# Patient Record
Sex: Male | Born: 1935 | Race: White | Hispanic: No | Marital: Married | Smoking: Former smoker
Health system: Southern US, Community
[De-identification: ages and names within clinical notes are randomized; demographics above are authoritative.]

## PROBLEM LIST (undated history)

## (undated) DIAGNOSIS — C61 Malignant neoplasm of prostate: Secondary | ICD-10-CM

## (undated) DIAGNOSIS — Z860100 Personal history of colon polyps, unspecified: Secondary | ICD-10-CM

## (undated) DIAGNOSIS — R350 Frequency of micturition: Secondary | ICD-10-CM

## (undated) DIAGNOSIS — I2510 Atherosclerotic heart disease of native coronary artery without angina pectoris: Secondary | ICD-10-CM

## (undated) DIAGNOSIS — G459 Transient cerebral ischemic attack, unspecified: Secondary | ICD-10-CM

## (undated) DIAGNOSIS — R3915 Urgency of urination: Secondary | ICD-10-CM

## (undated) DIAGNOSIS — R918 Other nonspecific abnormal finding of lung field: Secondary | ICD-10-CM

## (undated) DIAGNOSIS — R351 Nocturia: Secondary | ICD-10-CM

## (undated) DIAGNOSIS — Z9289 Personal history of other medical treatment: Secondary | ICD-10-CM

## (undated) DIAGNOSIS — M1990 Unspecified osteoarthritis, unspecified site: Secondary | ICD-10-CM

## (undated) DIAGNOSIS — M2540 Effusion, unspecified joint: Secondary | ICD-10-CM

## (undated) DIAGNOSIS — M549 Dorsalgia, unspecified: Secondary | ICD-10-CM

## (undated) DIAGNOSIS — R519 Headache, unspecified: Secondary | ICD-10-CM

## (undated) DIAGNOSIS — E785 Hyperlipidemia, unspecified: Secondary | ICD-10-CM

## (undated) DIAGNOSIS — N3281 Overactive bladder: Secondary | ICD-10-CM

## (undated) DIAGNOSIS — I639 Cerebral infarction, unspecified: Secondary | ICD-10-CM

## (undated) DIAGNOSIS — R42 Dizziness and giddiness: Secondary | ICD-10-CM

## (undated) DIAGNOSIS — K219 Gastro-esophageal reflux disease without esophagitis: Secondary | ICD-10-CM

## (undated) DIAGNOSIS — H3530 Unspecified macular degeneration: Secondary | ICD-10-CM

## (undated) DIAGNOSIS — Z8669 Personal history of other diseases of the nervous system and sense organs: Secondary | ICD-10-CM

## (undated) DIAGNOSIS — R51 Headache: Secondary | ICD-10-CM

## (undated) DIAGNOSIS — I10 Essential (primary) hypertension: Secondary | ICD-10-CM

## (undated) HISTORY — PX: HIP SURGERY: SHX245

## (undated) HISTORY — PX: PROSTATECTOMY: SHX69

## (undated) HISTORY — PX: CARDIAC CATHETERIZATION: SHX172

## (undated) HISTORY — PX: OTHER SURGICAL HISTORY: SHX169

## (undated) HISTORY — PX: ESOPHAGOGASTRODUODENOSCOPY: SHX1529

## (undated) HISTORY — PX: THYROIDECTOMY, PARTIAL: SHX18

## (undated) HISTORY — DX: Other nonspecific abnormal finding of lung field: R91.8

---

## 1998-01-27 ENCOUNTER — Ambulatory Visit (HOSPITAL_COMMUNITY): Admission: RE | Admit: 1998-01-27 | Discharge: 1998-01-27 | Payer: Self-pay | Admitting: Internal Medicine

## 2001-02-12 ENCOUNTER — Encounter: Payer: Self-pay | Admitting: Orthopaedic Surgery

## 2001-02-12 ENCOUNTER — Inpatient Hospital Stay (HOSPITAL_COMMUNITY): Admission: EM | Admit: 2001-02-12 | Discharge: 2001-02-13 | Payer: Self-pay

## 2002-05-26 ENCOUNTER — Encounter (INDEPENDENT_AMBULATORY_CARE_PROVIDER_SITE_OTHER): Payer: Self-pay | Admitting: Specialist

## 2002-05-26 ENCOUNTER — Ambulatory Visit (HOSPITAL_COMMUNITY): Admission: RE | Admit: 2002-05-26 | Discharge: 2002-05-26 | Payer: Self-pay | Admitting: *Deleted

## 2003-05-01 ENCOUNTER — Inpatient Hospital Stay (HOSPITAL_COMMUNITY): Admission: EM | Admit: 2003-05-01 | Discharge: 2003-05-08 | Payer: Self-pay | Admitting: Emergency Medicine

## 2003-05-03 ENCOUNTER — Encounter: Payer: Self-pay | Admitting: *Deleted

## 2003-12-09 ENCOUNTER — Emergency Department (HOSPITAL_COMMUNITY): Admission: EM | Admit: 2003-12-09 | Discharge: 2003-12-09 | Payer: Self-pay | Admitting: Emergency Medicine

## 2004-08-24 ENCOUNTER — Ambulatory Visit (HOSPITAL_COMMUNITY): Admission: RE | Admit: 2004-08-24 | Discharge: 2004-08-24 | Payer: Self-pay | Admitting: *Deleted

## 2009-03-03 ENCOUNTER — Inpatient Hospital Stay (HOSPITAL_BASED_OUTPATIENT_CLINIC_OR_DEPARTMENT_OTHER): Admission: RE | Admit: 2009-03-03 | Discharge: 2009-03-03 | Payer: Self-pay | Admitting: Cardiovascular Disease

## 2009-06-18 ENCOUNTER — Encounter: Admission: RE | Admit: 2009-06-18 | Discharge: 2009-06-18 | Payer: Self-pay | Admitting: Internal Medicine

## 2009-12-22 ENCOUNTER — Ambulatory Visit: Payer: Self-pay | Admitting: Cardiovascular Disease

## 2010-08-18 LAB — POCT I-STAT GLUCOSE
Glucose, Bld: 118 mg/dL — ABNORMAL HIGH (ref 70–99)
Operator id: 133881

## 2010-09-20 ENCOUNTER — Other Ambulatory Visit: Payer: Self-pay | Admitting: Cardiovascular Disease

## 2010-09-20 NOTE — Telephone Encounter (Signed)
Fax received from pharmacy. Refill completed. Jodette Symphoni Helbling RN  

## 2010-09-30 NOTE — Op Note (Signed)
NAME:  Garrett Henson, Garrett Henson                       ACCOUNT NO.:  [id number]   MEDICAL RECORD NO.:  [id number]                   PATIENT TYPE:  INP   LOCATION:  0104                                 FACILITY:  [facility]   PHYSICIAN:  Kerrin Champagne, M.D.                DATE OF BIRTH:  25-Dec-1924   DATE OF PROCEDURE:  05/01/2003  DATE OF DISCHARGE:                                 OPERATIVE REPORT   PREOPERATIVE DIAGNOSIS:  Left intertrochanteric hip fracture three part  minimally displaced.   POSTOPERATIVE DIAGNOSIS:  Left intertrochanteric hip fracture three part  minimally displaced.   PROCEDURE:  Left intertrochanteric hip fracture closed reduction and  internal fixation using a short Gamma 3 nail with dynamic proximal lag screw  measuring 120 mm and distal interlocking screw of 40 mm.   SURGEON:  Kerrin Champagne, M.D.   ASSISTANT:  Wende Neighbors, P.A.   ANESTHESIA:  GOT, Hezzie Bump. Rose, M.D.   ESTIMATED BLOOD LOSS:  50 mL   DRAINS:  Foley to straight drain.   BRIEF CLINICAL NOTE:  The patient is a 77 year old male who reportedly fell  earlier this afternoon sustaining a left hip injury, difficulty  weightbearing.  He was able to upright himself and then drive himself in his  own vehicle home.  There an ambulance brought him to the emergency room with  left hip injury.  Radiographs demonstrating a left intertrochanteric hip  fracture minimally displaced.  A three part injury, lesser trochanter is  displaced, a large fragment proximally between the greater and lesser  trochanter.  Intraoperative findings as above.   DESCRIPTION OF PROCEDURE:  After adequate general anesthesia, the patient  transferred to the Chick fracture table.  The left lower extremity placed in  longitudinal traction, left groin up in place. The right leg in a well  holder.   Standard prep with duraprep solution, standard preoperative antibiotics.  An  iodine exclusion vidrape was used against the left leg.   The incision over  the left proximal thigh laterally approximately an inch proximal to the  greater trochanter approximately an inch and a half in length through the  skin and subcutaneous layers.  The tensor fascia incised in line with the  skin incision and finger palpation to identify the greater trochanter.  Cannulated awl then inserted through the tip of the greater trochanter and  the AP and lateral views observed to enter the intramedullary canal, the  proximal femur and guide pin then passed into the proximal femoral canal and  femoral channel distally.  This observed in the AP and lateral planes to be  within the femoral channel extending proximally through the tip of the  greater trochanter. At this point, the soft tissue sleeve protector was  inserted and using the cannulated large reamer reaming performed through the  tip of the greater trochanter into the proximal intramedullary channel, the  femur was performed  without difficulty reaming to 17 mm proximally.  Next,  the short gamma nail 120 degree angle was then inserted by hand and then  positioned appropriately.  The proximal neural locking nail hole in the  interior 1/2 of the femoral head and neck.  The position alignment of the  anteversion trial.  A stab incision made counter to the area of the tube for  placement of the proximal lag screw.  The stab incision carried through the  skin and subcu layers as well as through the lateral portion of the fascia  lata.  The tube for insertion of the proximal lag screw then placed against  bone opposite the area for the interlocking screw hole.  Then the guide pin  for the proximal lag screw was inserted using the insertion guide placed at  the 125 degree angle. The pin carefully positioned within the posterior 1/2  of the neck and head on the lateral view.   Pin placed into the subchondral bone, measurement of total length for the  proximal lag screw was 120 mm.  Reaming was  performed by using the step cut  reamer to obtain the total length necessary.  Then the screw was placed over  the guide pin and then screwed into position alignment just short of  subchondral bone about 2 mm in both AP and lateral planes.  Next, the  proximal pin for capturing the lag screw was inserted into the proximal  portion in the center portion of the nail.  Note that the guide pin for  insertion of the nail was removed prior to the placement of the guide pin  for the proximal lag screw.  With this then the pin was placed into position  allowing it tighten down securely and then released 1/2 turn.  Dynamic  compression obtained across the fracture site by rotating or turning the nut  just proximal to the very superficial portion of the sleeve user insertion  of the lag screw.  This was tightened to completeness after relaxing the  longitudinal traction.  Provided excellent compression across the fracture  site.  With this then the insertion handles where the lag screw was removed,  the guide pin for the lag screw was removed.  The sleeves were used for  placement of the distal interlocking screw for the nail was then used to be  placed into the hand in static position and a stab incision made counter to  this over the lateral aspect of the thigh down to bone.  Spread using a  hemostat.  The tube then placed down the bone.  The internal sleeve used to  help for drilling purposes, drill hole placed across the distal interlocking  screw hole without difficulty measuring the depth of the expected screw at  about 40 mm.  A 40 mm screw was placed without difficulty, observed AP and  lateral planes to be through the distal interlocking screw hole capturing  both the superficial and deep cortices quite nicely.  The sleeve and  insertion handle were then removed.  Free hand the proximal cap for the  gamma nail was placed without difficulty.  Irrigation then performed across all incisions.   Permanent C-arm images obtained in the AP and lateral planes  both the proximal and distal portions of the nail observing the fracture to  be in excellent position and alignment, the nail to be well seated in good  position and alignment.  Both the proximal lag screw and distal  interlocking  screws in good position and alignment.  Following irrigation then closure  was performed, reapproximating the superficial fascial layer of the tensor  muscle and tensor fascia lata with interrupted #0 Vicryl suture, the deep  subcu layer was approximated with interrupted #0 and then 2-0 Vicryl sutures  and the skin closed with stainless steel staples.  Stab incisions for the  lag screw and the distal interlock screw or closed with a superficial 2-0  Vicryl suture reapproximating the subcu layers and then a stainless steel  staple approximating the skin edges. Dressings, 4 x 4 affixed to the skin  with hypofix tape.  The patient was then returned to his bed, reactivated,  extubated and returned to the recovery room in satisfactory condition.  All  instrument and sponge counts were correct.                                               Kerrin Champagne, M.D.    JEN/MEDQ  D:  05/01/2003  T:  05/02/2003  Job:  [id number]

## 2010-09-30 NOTE — Op Note (Signed)
NAME:  TAD, FANCHER                       ACCOUNT NO.:  [id number]   MEDICAL RECORD NO.:  [id number]                   PATIENT TYPE:  INP   LOCATION:  5738                                 FACILITY:  [facility]   PHYSICIAN:  Graylin Shiver, M.D.                DATE OF BIRTH:  10-09-1924   DATE OF PROCEDURE:  05/04/2003  DATE OF DISCHARGE:                                 OPERATIVE REPORT   PROCEDURE PERFORMED:  Esophagogastroduodenoscopy.   ENDOSCOPIST:  Graylin Shiver, M.D.   INDICATIONS FOR PROCEDURE:  Coffee ground emesis.  Informed consent was  obtained after explanation of the risks of bleeding, infection and  perforation.   PREMEDICATIONS:  Fentanyl 25 mcg IV, Versed 2 mg IV.   DESCRIPTION OF PROCEDURE:  With the patient in the left lateral decubitus  position the Olympus gastroscope was inserted into the oropharynx and passed  into the esophagus.  It was advanced down the esophagus and into the  stomach.  It was then advanced into the duodenum.  The second portion and  bulb of the duodenum looked normal.  The stomach showed some focal gastritis  in the distal portion of the stomach which I suspect is where the coffee  ground emesis came from. The rest of the stomach looked normal.  No lesions  were seen in the fundus or cardia.  The esophagus looked normal.  The  patient tolerated the procedure well without complications.   IMPRESSION:  Focal gastritis in the distal stomach, otherwise no specific  abnormalities.   PLAN:  I would recommended continuing the patient on Protonix.  No other  intervention is necessary at this time.                                               Graylin Shiver, M.D.    Germain Osgood  D:  05/04/2003  T:  05/05/2003  Job:  [id number]   cc:   Kerrin Champagne, M.D.  [address]  [place]  [place] [address]  Fax: [phone]   Hettie Holstein, D.O.  Fax: [phone]

## 2010-09-30 NOTE — Discharge Summary (Signed)
[facility]  Patient:    Garrett Henson, Garrett Henson Visit Number: [id number] MRN: [id number]          Service Type: MED Location: [phone] Attending Physician:  Patricia Nettle Dictated by:   Ralene Bathe, P.A. Admit Date:  02/11/2001 Discharge Date: February 22, 202002                             Discharge Summary  ADMISSION DIAGNOSES: 1. Acute T12 compression fracture. 2. History of prostate cancer with prostatectomy.  DISCHARGE DIAGNOSES: 1. Acute T12 compression fracture. 2. history of prostate cancer with prostatectomy.  PROCEDURE:  MRI.  OPERATIONS:  None.  CONSULTATIONS:  None.  HISTORY OF PRESENT ILLNESS:  [name] is a 77 year old male who slipped while walking down an incline and had immediate onset of back pain.  He was able to ambulate initially, but significant pain.  No significant radicular symptoms, bowel or bladder dysfunction.  He had no previous history of back injury, and was brought to [facility] Emergency Room where x-rays confirmed an acute compression fracture of T12 at approximately 60%.  He was admitted for pain control measures, as well as MRI for further evaluation to rule out any acute compromise and consideration of kiphoplasty.  HOSPITAL COURSE:  The patient was admitted and placed on bedrest initially. [employer] was called and consulted for a hyperextension brace.  After fitting with a brace, the patient was able to ambulate.  He had back pain as expected, however, tolerated being out of bed.  An MRI was ordered, it showed no significant compromise.  It did confirm a compression fracture approximately 60% of the anterior wedge.  On hospital day #2, the patient was having pain that was controlled with analgesics and tolerating being out of bed.  At this time, it was felt he was stable for discharge to home for analgesics and further monitoring and consideration at a later date for kiphoplasty if his pain should  increase or somewhat become uncontrolled.  Prior to discharge, his physical examination showed no neurovascular compromise, he was neurovascularly intact.  No bowel or bladder dysfunction.  LABORATORY DATA:  Admission labs with a glucose of 224 and glucosuria, otherwise normal labs.  Radiology shows osteopenia with nothing acute in the lumbar spine.  He has a T12 anterior compression fracture at approximately 60%.  CONDITION ON DISCHARGE:  Stable.  DISCHARGE PLANS:  The patient is being discharged to home.  He has the hyperextension brace which he will wear when out of bed.  ______ supine position.  DISCHARGE MEDICATIONS: 1. Percocet 5 mg one q.4-6h. p.r.n. pain. 2. Robaxin 500 mg one q.8h. p.r.n. spasm. 3. Peri-Colace one b.i.d.  FOLLOWUP:  One week.  Recommend calcium and vitamin D supplements.  We will address osteopenia measures at a later date. Dictated by:   Ralene Bathe, P.A. Attending Physician:  Patricia Nettle. DD:  02/13/01 TD:  02/13/01 Job: [id number]

## 2010-09-30 NOTE — Discharge Summary (Signed)
Garrett Henson, Garrett Henson                       ACCOUNT NO.:  [id number]   MEDICAL RECORD NO.:  [id number]                   PATIENT TYPE:  INP   LOCATION:  5738                                 FACILITY:  [facility]   PHYSICIAN:  Kerrin Champagne, M.D.                DATE OF BIRTH:  [birth year]   DATE OF ADMISSION:  05/01/2003  DATE OF DISCHARGE:  05/08/2003                                 DISCHARGE SUMMARY   ADMISSION DIAGNOSES:  1. Left intertrochanteric hip fracture, three part, minimally displaced.  2. History of prostate cancer, status post prostatectomy.  3. Status post partial thyroidectomy.  4. Hyperlipidemia.  5. Non-insulin-dependent diabetes mellitus.   DISCHARGE DIAGNOSES:  1. Left intertrochanteric hip fracture, three part, minimally displaced.  2. Postoperative colonic ileus, resolved at discharge.  3. Diabetic gastroparesis with nausea and vomiting.  4. Focal gastritis in distal stomach per esophagogastroduodenoscopy by Dr.     Herbert Moors.  5. Postoperative anemia without requirement for blood transfusion.  6. History of prostate cancer, status post prostatectomy.  7. Status post partial thyroidectomy.  8. Hyperlipidemia.  9. Non-insulin-dependent diabetes mellitus.   PROCEDURE:  1. May 01, 2003 the patient underwent closed reduction, internal     fixation of left trochanteric hip fracture utilizing a short Gamma nail     performed by Dr. Otelia Sergeant, assisted by Maud Deed, PA-C under general     anesthesia.  2. May 04, 2003 the patient underwent esophagogastroduodenoscopy by Dr.     Herbert Moors   CONSULTATIONS:  Dr. Garnette Czech, [facility].  Dr. Herbert Moors of gastroenterology.   HISTORY OF PRESENT ILLNESS:  Briefly, the patient is a 77 year old white  male who fell on the day of admission sustaining a left hip injury.  He had  difficulty with weight bearing and pain of the left hip requiring evaluation  in the emergency room.  Radiographs in the  emergency room revealed a left  intertrochanteric hip fracture minimally displaced.  It was felt he would  require surgical intervention and was admitted for the procedure as stated  above.   BRIEF HOSPITAL COURSE:  The patient tolerated the procedure under general  anesthesia.  On the first postoperative day neurovascular motor functions of  the lower extremities was noted to be intact.  Hemoglobin and hematocrit  were checked and noted to be 12.1 and 35.3 with normal electrolytes.  The  patient was started on PCA analgesics and to begin weaning to p.o.  analgesics.  Unfortunately, on the second postoperative day he started  complaining of severe nausea and vomiting.  He was unable to participate  with physical therapy secondary to his severe nausea and vomiting and later  on that day did require placement of an NG tube.  Medical consult was  obtained and it was suspected that patient had a possible small bowel  obstruction versus an ileus and x-rays were obtained.  Coffee-ground emesis  was noted, however, the patient was noted to be hemodynamically stable and  therefore a gastroenterology consultation was obtained.  Medications were  added including proton pump inhibitor.  He did have an elevated temperature  and chest x-ray as well as blood cultures and urinalysis were obtained.  The  patient was made NPO and seen by Dr. Evette Cristal who recommended  esophagogastroduodenoscopy in a day or so.  He continued with the NG tube  until the patient was less nauseated.  After the patient eventually did have  a bowel movement arrangements were made for him to undergo the  esophagogastroduodenoscopy.  His abdominal distention continued to improve  slowly.  The esophagogastroduodenoscopy did prove focal gastritis in the  stomach.  He was placed on Protonix.  He was started on a clear liquid diet  and his diet was progressed very slowly.  He was treated with sliding scale  insulin until he was able to  take oral medications and at that time  metformin was restarted.  He continued with some mild nausea and vomiting  and Reglan was used.  He initially had been started on Coumadin for deep  venous thrombosis prophylaxis, however, this was discontinued in light of  his gastritis.  Baby aspirin was utilized for deep venous thrombosis  prophylaxis.  From an orthopedic standpoint his wounds were healing well  throughout the hospital stay.  Once he was medically stable he was able to  progress with physical therapy and was allowed out of bed with physical  therapy assistance utilizing a walker.  The patient required close  supervision utilizing a rolling walker and was able to ambulate 80 feet.  He  was allowed partial weight-bearing at 50% on the left lower extremity.  Physical therapy also assisted with range of motion and strengthening  exercises of the lower extremities.  On May 07, 2003 the patient was  afebrile with vital signs stable.  He was feeling better from a medical  standpoint and felt stable from a medical standpoint by Hospitalist's for  discharge to his home.   LABORATORY DATA:  Pertinent laboratory values include admission CBC with  values within normal limits.  Hemoglobin and hematocrit on admission were  14.9 and 43.0 respectively.  Values drop to lowest value of 10.9 and 31.5.  The patient had a normal white count throughout the hospital stay.  Coagulation studies on admission were within normal limits. He did have  mildly elevated Pro Time's as he was on Coumadin for a short period of time.  His INR did rise to as high as 2.3 and 2.4 on May 08, 2003.  The  Coumadin was discontinued and baby aspirin was started in light of his  gastritis.  Chemistry studies also revealed elevated glucose of 151 and  hemoglobin A1C of 7.5.   The patient was treated with sliding scale insulin and metformin during the  hospital stay.  Blood cultures showed no growth.  Urinalysis on  May 03, 2003 showed many bacteria, 30 protein and 15 ketones, trace hemoglobin, 250  glucose and turbid appearance.  Repeat on May 06, 2003 showed moderate  hemoglobin, 3-6 white blood cells, 3-6 red blood cells and no bacteria.  Chest x-ray on May 03, 2003 showed mild bibasilar atelectasis.  Apparent fullness of the right side of the mediastinum and right hilum  probably due to patient rotation but follow up PA of the chest was  recommended.  Colonic ileus was noted on KUB on May 03, 2003.  Electrocardiogram on admission showed normal sinus rhythm with frequent  premature ventricular complexes and a pattern of bigeminy.  Minimal voltage  criteria for left ventricular hypertrophy, nonspecific T wave abnormality  with increased PVC's since previous reading confirmed by Dr. Eden Emms.  That  electrocardiogram was on May 03, 2003.  The previous electrocardiogram  on May 01, 2003 had shown sinus rhythm with frequent premature  ventricular complexes, otherwise normal with no previous tracings confirmed  by Dr. Lucas Mallow.   PLAN:  The patient was allowed discharge on May 08, 2003.  He was given  prescriptions for Vicodin to take every 4 to 6 hours as needed for pain.  Arrangements were made for patient to have advanced home health for physical  therapy.  He will continue to be 50% partial weight-bearing on the left  lower extremity and utilize a walker for ambulation.  The patient was  advised to be on a laxative as long as he was taking narcotic analgesics.  He will resume his home medications as taken prior to admission.  Dressing  change will be done daily at home and the patient is allowed to shower. He  will follow up with Dr. Otelia Sergeant one week from the date of discharge and has  been given the number to call and make arrangements for the appointment.  Coumadin was discontinued and therefore the patient will be on one baby  aspirin daily.  He will follow up with  his primary care physician in regards  to his medical follow up and should do so in the next couple of weeks.  He  will see Dr. Evette Cristal on  PRN basis.  The patient was given a prescription for  Reglan and Protonix to use as an outpatient and will once again follow up  with his  primary care physician in regards to continuation of these medications.  The  patient was advised to call the office if he had questions or concerns prior  to his return office visit.   CONDITION ON DISCHARGE:  Stable.      Wende Neighbors, P.A.                    Kerrin Champagne, M.D.    SMV/MEDQ  D:  07/21/2003  T:  07/22/2003  Job:  [id number]

## 2010-09-30 NOTE — Consult Note (Signed)
NAME:  Garrett Henson, Garrett Henson                       ACCOUNT NO.:  [id number]   MEDICAL RECORD NO.:  [id number]                   PATIENT TYPE:  INP   LOCATION:  5727                                 FACILITY:  [facility]   PHYSICIAN:  Graylin Shiver, M.D.                DATE OF BIRTH:  Nov 05, 1924   DATE OF CONSULTATION:  05/03/2003  DATE OF DISCHARGE:                                   CONSULTATION   REFERRING PHYSICIANS:  1. Hettie Holstein, D.O.  2. Kerrin Champagne, M.D.   REASON FOR CONSULTATION:  The patient is a 77 year old Caucasian male who  suffered a left intertrochanteric hip fracture recently.  He underwent  surgical repair of this.  He had a left intertrochanteric hip fracture  closed reduction and internal fixation by Dr. Otelia Sergeant.  The patient has been  in bed and has had analgesic with a morphine PCA pump.  Today the patient  noted abdominal distention and involvement with coffee ground-appearing  material.  An NG tube was placed and was Gastroccult positive.  The material  for NG is coffee ground appearing and a small amount.  No red blood was  seen.  The patient reports that 15 years ago when he had his other hip  broken that his abdomen got distended also and he had an NG tube for a  period of time after that.  He gives a history of peptic ulcer disease years  ago.   The patient also reports constipation.  He states that he has not had a  bowel movement since admission.  He feels as though he has to have a bowel  movement.   PAST SURGICAL HISTORY:  1. Hip surgery as above.  2. Prostate surgery.  3. Thyroid nodule removed in the past.   PAST MEDICAL HISTORY:  1. Borderline diabetic.  2. Hypercholesterolemia.  3. History of peptic ulcer disease.   ALLERGIES:  No known drug allergies.   MEDICATIONS PRIOR TO ADMISSION:  Lipitor.   SOCIAL HISTORY:  He does not smoke or drink alcohol.   REVIEW OF SYSTEMS:  He has a fever today.  He denies anginal chest pain and  shortness  of breath.  Mild sputum production.  GI history as above.   PHYSICAL EXAMINATION:  VITAL SIGNS:  Temperature 101.1 degrees this  afternoon.  Blood pressure 177/73, pulse 50.  GENERAL APPEARANCE:  He is in no acute distress.  HEENT:  Nonicteric.  NECK:  Supple.  HEART:  Regular rhythm.  No murmurs.  LUNGS:  Clear.  ABDOMEN:  Bowel sounds x 3.  Mildly distended.  There is mild diffuse  discomfort.  No rebound or guarding.   LABORATORIES:  Hemoglobin 12, hematocrit 35.3.  PT 14.8.  The patient was  started on Coumadin yesterday.  Electrolytes:  Sodium 135, potassium 4.2,  chloride 102, CO2 29.   IMPRESSION:  1. Probable ileus secondary to a combination of  immobility, narcotic     analgesics, and constipation.  2. Coffee ground emesis.  3. History of peptic ulcer disease.  4. Constipation.   PLAN:  1. NG tube.  2. IV Protonix.  3. Hold Coumadin for now.  4. Follow H&Hs.  5. I will give him a Dulcolax suppository tonight since he feels like he has     to have a bowel movement.  6. Will check x-rays of abdomen.  7. Will probably have an EGD in a couple of days to evaluate the upper GI     tract.                                               Graylin Shiver, M.D.    SFG/MEDQ  D:  05/03/2003  T:  05/04/2003  Job:  [id number]   cc:   Hettie Holstein, D.O.  Fax: [phone]

## 2010-10-03 ENCOUNTER — Other Ambulatory Visit: Payer: Self-pay | Admitting: Cardiovascular Disease

## 2010-10-03 NOTE — Telephone Encounter (Signed)
Refill done.  

## 2011-02-08 ENCOUNTER — Other Ambulatory Visit: Payer: Self-pay | Admitting: Cardiovascular Disease

## 2011-02-08 NOTE — Telephone Encounter (Signed)
Fax Received. Refill Completed. Ysidra Sopher Chowoe (M.A)  

## 2011-04-21 ENCOUNTER — Emergency Department (HOSPITAL_COMMUNITY): Payer: Medicare Other

## 2011-04-21 ENCOUNTER — Inpatient Hospital Stay (HOSPITAL_COMMUNITY)
Admission: EM | Admit: 2011-04-21 | Discharge: 2011-04-24 | DRG: 066 | Disposition: A | Discharge status: Home / Self Care | Payer: Medicare Other | Attending: Internal Medicine | Admitting: Internal Medicine

## 2011-04-21 DIAGNOSIS — I6350 Cerebral infarction due to unspecified occlusion or stenosis of unspecified cerebral artery: Principal | ICD-10-CM | POA: Diagnosis present

## 2011-04-21 DIAGNOSIS — I658 Occlusion and stenosis of other precerebral arteries: Secondary | ICD-10-CM | POA: Diagnosis present

## 2011-04-21 DIAGNOSIS — I639 Cerebral infarction, unspecified: Secondary | ICD-10-CM | POA: Diagnosis present

## 2011-04-21 DIAGNOSIS — R911 Solitary pulmonary nodule: Secondary | ICD-10-CM | POA: Diagnosis present

## 2011-04-21 DIAGNOSIS — E119 Type 2 diabetes mellitus without complications: Secondary | ICD-10-CM | POA: Diagnosis present

## 2011-04-21 DIAGNOSIS — I6529 Occlusion and stenosis of unspecified carotid artery: Secondary | ICD-10-CM | POA: Diagnosis present

## 2011-04-21 DIAGNOSIS — I10 Essential (primary) hypertension: Secondary | ICD-10-CM | POA: Diagnosis present

## 2011-04-21 DIAGNOSIS — Z7902 Long term (current) use of antithrombotics/antiplatelets: Secondary | ICD-10-CM

## 2011-04-21 DIAGNOSIS — Z79899 Other long term (current) drug therapy: Secondary | ICD-10-CM

## 2011-04-21 DIAGNOSIS — R079 Chest pain, unspecified: Secondary | ICD-10-CM | POA: Diagnosis present

## 2011-04-21 DIAGNOSIS — I2510 Atherosclerotic heart disease of native coronary artery without angina pectoris: Secondary | ICD-10-CM | POA: Diagnosis present

## 2011-04-21 DIAGNOSIS — E785 Hyperlipidemia, unspecified: Secondary | ICD-10-CM | POA: Diagnosis present

## 2011-04-21 DIAGNOSIS — I080 Rheumatic disorders of both mitral and aortic valves: Secondary | ICD-10-CM | POA: Diagnosis present

## 2011-04-21 DIAGNOSIS — Z8546 Personal history of malignant neoplasm of prostate: Secondary | ICD-10-CM

## 2011-04-21 DIAGNOSIS — Z7982 Long term (current) use of aspirin: Secondary | ICD-10-CM

## 2011-04-21 HISTORY — DX: Atherosclerotic heart disease of native coronary artery without angina pectoris: I25.10

## 2011-04-21 HISTORY — DX: Malignant neoplasm of prostate: C61

## 2011-04-21 LAB — DIFFERENTIAL
Basophils Absolute: 0 10*3/uL (ref 0.0–0.1)
Basophils Relative: 1 % (ref 0–1)
Eosinophils Absolute: 0.7 10*3/uL (ref 0.0–0.7)
Eosinophils Relative: 12 % — ABNORMAL HIGH (ref 0–5)
Lymphocytes Relative: 30 % (ref 12–46)
Lymphs Abs: 1.7 10*3/uL (ref 0.7–4.0)
Monocytes Absolute: 0.5 10*3/uL (ref 0.1–1.0)
Monocytes Relative: 9 % (ref 3–12)
Neutro Abs: 2.8 10*3/uL (ref 1.7–7.7)
Neutrophils Relative %: 49 % (ref 43–77)

## 2011-04-21 LAB — TROPONIN I: Troponin I: <0.3 ng/mL (ref <0.30)

## 2011-04-21 LAB — CBC
HCT: 32.6 % — ABNORMAL LOW (ref 39.0–52.0)
Hemoglobin: 11 g/dL — ABNORMAL LOW (ref 13.0–17.0)
MCH: 27.8 pg (ref 26.0–34.0)
MCHC: 33.7 g/dL (ref 30.0–36.0)
MCV: 82.5 fL (ref 78.0–100.0)
Platelets: 194 10*3/uL (ref 150–400)
RBC: 3.95 MIL/uL — ABNORMAL LOW (ref 4.22–5.81)
RDW: 13.5 % (ref 11.5–15.5)
WBC: 5.6 10*3/uL (ref 4.0–10.5)

## 2011-04-21 LAB — COMPREHENSIVE METABOLIC PANEL
ALT: 13 U/L (ref 0–53)
AST: 18 U/L (ref 0–37)
Albumin: 3.5 g/dL (ref 3.5–5.2)
Alkaline Phosphatase: 69 U/L (ref 39–117)
BUN: 14 mg/dL (ref 6–23)
CO2: 26 mEq/L (ref 19–32)
Calcium: 8.9 mg/dL (ref 8.4–10.5)
Chloride: 101 mEq/L (ref 96–112)
Creatinine, Ser: 0.94 mg/dL (ref 0.50–1.35)
GFR calc Af Amer: 87 mL/min — ABNORMAL LOW (ref >90)
GFR calc non Af Amer: 75 mL/min — ABNORMAL LOW (ref >90)
Glucose, Bld: 158 mg/dL — ABNORMAL HIGH (ref 70–99)
Potassium: 3.8 mEq/L (ref 3.5–5.1)
Sodium: 136 mEq/L (ref 135–145)
Total Bilirubin: 0.2 mg/dL — ABNORMAL LOW (ref 0.3–1.2)
Total Protein: 6.4 g/dL (ref 6.0–8.3)

## 2011-04-21 LAB — URINALYSIS, ROUTINE W REFLEX MICROSCOPIC
Bilirubin Urine: NEGATIVE
Glucose, UA: NEGATIVE mg/dL
Hgb urine dipstick: NEGATIVE
Ketones, ur: NEGATIVE mg/dL
Leukocytes, UA: NEGATIVE
Nitrite: NEGATIVE
Protein, ur: NEGATIVE mg/dL
Specific Gravity, Urine: 1.009 (ref 1.005–1.030)
Urobilinogen, UA: 1 mg/dL (ref 0.0–1.0)
pH: 7 (ref 5.0–8.0)

## 2011-04-21 LAB — CK TOTAL AND CKMB (NOT AT ARMC)
CK, MB: 3 ng/mL (ref 0.3–4.0)
Relative Index: INVALID (ref 0.0–2.5)
Total CK: 81 U/L (ref 7–232)

## 2011-04-21 LAB — PROTIME-INR
INR: 1.07 (ref 0.00–1.49)
Prothrombin Time: 14.1 seconds (ref 11.6–15.2)

## 2011-04-21 LAB — APTT: aPTT: 34 seconds (ref 24–37)

## 2011-04-21 NOTE — ED Notes (Signed)
Dr Clarene Duke made aware pt poss. Code Stroke and in to see pt. Even though pt is nonsymptomatic at present with full rom and no drift or tingling noted .

## 2011-04-21 NOTE — ED Notes (Addendum)
At 730 pm pt and his wife were enjoying a christmas play at church when suddenly the patient leaned over to his wife and said something is wrong. He c/o numbness and tingling to left arm/leg. Nurse onscene found pt to be hypertensive with a b/p of 220/100. There is no history of HTN. Paramedics came and brought pt to ER. At present pt c/o head ache and some tingling to left arm.Pt just here from er yellow.CT here to pick up pt

## 2011-04-22 ENCOUNTER — Other Ambulatory Visit: Payer: Self-pay

## 2011-04-22 ENCOUNTER — Emergency Department (HOSPITAL_COMMUNITY): Payer: Medicare Other

## 2011-04-22 ENCOUNTER — Encounter (HOSPITAL_COMMUNITY): Payer: Self-pay | Admitting: Internal Medicine

## 2011-04-22 DIAGNOSIS — I10 Essential (primary) hypertension: Secondary | ICD-10-CM | POA: Diagnosis present

## 2011-04-22 DIAGNOSIS — R079 Chest pain, unspecified: Secondary | ICD-10-CM | POA: Diagnosis present

## 2011-04-22 DIAGNOSIS — I639 Cerebral infarction, unspecified: Secondary | ICD-10-CM | POA: Diagnosis present

## 2011-04-22 LAB — CARDIAC PANEL(CRET KIN+CKTOT+MB+TROPI): Total CK: 74 U/L (ref 7–232)

## 2011-04-22 LAB — CBC
MCH: 27.9 pg (ref 26.0–34.0)
MCV: 82.5 fL (ref 78.0–100.0)
Platelets: 216 10*3/uL (ref 150–400)
RDW: 13.4 % (ref 11.5–15.5)

## 2011-04-22 LAB — LIPID PANEL
Cholesterol: 118 mg/dL (ref 0–200)
Triglycerides: 91 mg/dL (ref <150)
VLDL: 18 mg/dL (ref 0–40)

## 2011-04-22 LAB — CREATININE, SERUM: Creatinine, Ser: 0.94 mg/dL (ref 0.50–1.35)

## 2011-04-22 LAB — HEMOGLOBIN A1C: Mean Plasma Glucose: 146 mg/dL — ABNORMAL HIGH (ref <117)

## 2011-04-22 LAB — GLUCOSE, CAPILLARY: Glucose-Capillary: 133 mg/dL — ABNORMAL HIGH (ref 70–99)

## 2011-04-22 LAB — GLUCOSE, POCT (MANUAL RESULT ENTRY): POC Glucose: 115

## 2011-04-22 MED ORDER — HEPARIN SODIUM (PORCINE) 5000 UNIT/ML IJ SOLN
5000.0000 [IU] | Freq: Three times a day (TID) | INTRAMUSCULAR | Status: DC
  Administered 2011-04-22 – 2011-04-24 (×6): 5000 [IU] via SUBCUTANEOUS
  Filled 2011-04-22 (×9): qty 1

## 2011-04-22 MED ORDER — ISOSORBIDE MONONITRATE ER 60 MG PO TB24
60.0000 mg | ORAL_TABLET | ORAL | Status: DC
  Administered 2011-04-23 – 2011-04-24 (×2): 60 mg via ORAL
  Filled 2011-04-22 (×3): qty 1

## 2011-04-22 MED ORDER — SODIUM CHLORIDE 0.9 % IJ SOLN
3.0000 mL | Freq: Two times a day (BID) | INTRAMUSCULAR | Status: DC
  Administered 2011-04-22 – 2011-04-24 (×4): 3 mL via INTRAVENOUS

## 2011-04-22 MED ORDER — SODIUM CHLORIDE 0.9 % IV SOLN: INTRAVENOUS | Status: DC

## 2011-04-22 MED ORDER — SODIUM CHLORIDE 0.9 % IV SOLN: 250.0000 mL | INTRAVENOUS | Status: DC | PRN

## 2011-04-22 MED ORDER — ASPIRIN 325 MG PO TABS
325.0000 mg | ORAL_TABLET | Freq: Every day | ORAL | Status: DC
  Administered 2011-04-22: 325 mg via ORAL
  Filled 2011-04-22 (×2): qty 1

## 2011-04-22 MED ORDER — METHYLPREDNISOLONE SODIUM SUCC 125 MG IJ SOLR
60.0000 mg | INTRAMUSCULAR | Status: DC
  Administered 2011-04-22: 60 mg via INTRAVENOUS
  Filled 2011-04-22 (×2): qty 2

## 2011-04-22 MED ORDER — THERA M PLUS PO TABS
1.0000 | ORAL_TABLET | ORAL | Status: DC
  Administered 2011-04-23 – 2011-04-24 (×2): 1 via ORAL
  Filled 2011-04-22 (×3): qty 1

## 2011-04-22 MED ORDER — ROSUVASTATIN CALCIUM 10 MG PO TABS
10.0000 mg | ORAL_TABLET | Freq: Every day | ORAL | Status: DC
  Administered 2011-04-22 – 2011-04-23 (×2): 10 mg via ORAL
  Filled 2011-04-22 (×3): qty 1

## 2011-04-22 MED ORDER — GADOBENATE DIMEGLUMINE 529 MG/ML IV SOLN
20.0000 mL | Freq: Once | INTRAVENOUS | Status: AC | PRN
  Administered 2011-04-22: 20 mL via INTRAVENOUS

## 2011-04-22 MED ORDER — CLOPIDOGREL BISULFATE 75 MG PO TABS
75.0000 mg | ORAL_TABLET | Freq: Every day | ORAL | Status: DC
  Administered 2011-04-22 – 2011-04-24 (×3): 75 mg via ORAL
  Filled 2011-04-22 (×4): qty 1

## 2011-04-22 MED ORDER — SODIUM CHLORIDE 0.9 % IV SOLN
INTRAVENOUS | Status: DC
  Administered 2011-04-22: 12:00:00 via INTRAVENOUS

## 2011-04-22 MED ORDER — ACETAMINOPHEN 325 MG PO TABS
650.0000 mg | ORAL_TABLET | Freq: Once | ORAL | Status: AC
  Administered 2011-04-22: 650 mg via ORAL
  Filled 2011-04-22: qty 2

## 2011-04-22 MED ORDER — SODIUM CHLORIDE 0.9 % IJ SOLN: 3.0000 mL | INTRAMUSCULAR | Status: DC | PRN

## 2011-04-22 MED ORDER — RANOLAZINE ER 500 MG PO TB12
500.0000 mg | ORAL_TABLET | Freq: Every day | ORAL | Status: DC
  Administered 2011-04-22 – 2011-04-23 (×2): 500 mg via ORAL
  Filled 2011-04-22 (×3): qty 1

## 2011-04-22 MED ORDER — DIPHENHYDRAMINE HCL 25 MG PO CAPS
12.5000 mg | ORAL_CAPSULE | Freq: Once | ORAL | Status: AC
  Administered 2011-04-22: 25 mg via ORAL
  Filled 2011-04-22: qty 1

## 2011-04-22 MED ORDER — ACETAMINOPHEN 325 MG PO TABS: 650.0000 mg | ORAL_TABLET | ORAL | Status: DC | PRN

## 2011-04-22 MED ORDER — OMEGA-3 FATTY ACIDS 1000 MG PO CAPS
1.0000 g | ORAL_CAPSULE | ORAL | Status: DC
  Administered 2011-04-23 – 2011-04-24 (×2): 1 g via ORAL
  Filled 2011-04-22 (×3): qty 1

## 2011-04-22 MED ORDER — NITROGLYCERIN 0.4 MG SL SUBL: 0.4000 mg | SUBLINGUAL_TABLET | SUBLINGUAL | Status: DC | PRN

## 2011-04-22 MED ORDER — SENNOSIDES-DOCUSATE SODIUM 8.6-50 MG PO TABS: 1.0000 | ORAL_TABLET | Freq: Every evening | ORAL | Status: DC | PRN

## 2011-04-22 NOTE — ED Notes (Signed)
Attempted to call report to [place], RN - [phone] - advised she is at lunch and will return call in 30 minutes.  Attempted call report to Tammy, Consulting civil engineer -[phone], was advised she is also at lunch per [employer] operational officer.  Melford Aase, Consulting civil engineer, aware.

## 2011-04-22 NOTE — ED Provider Notes (Signed)
Spoke radiologist. Patient has a posterior right frontal and right parietal short. Radiologist states he has "small areas of infarct". Spoke with Dr.Bezov. He will come consult with the patient recommends general medicine admission. Spoke with Dr. Sunnie Nielsen, triad hospitalist. She will see the patient for admission. Request telemetry bed, Team 5, temporary admission orders.  Patient reports erythema of the right hand that occurred after having MRI. Evaluated R hand has mild discoloration but no raised rash. Will give 12.5 mg Benadryl. Patient denies pruritus. Does have a wound on dorsal portion of hand. Advised close evaluation to make sure erythema does not become cellulitis.   Thomasene Lot, [place] 04/22/11 1219  Thomasene Lot, PA 04/22/11 1220

## 2011-04-22 NOTE — ED Notes (Signed)
Spoke with Garrett Henson, MRI, to ensure received modified order from Dr [facility] at 0750.  Pt remains in MRI at this time.

## 2011-04-22 NOTE — ED Notes (Signed)
[id number] ready

## 2011-04-22 NOTE — ED Provider Notes (Addendum)
History     CSN: [id number] Arrival date & time: 04/21/2011 10:02 PM   First MD Initiated Contact with Patient 04/22/11 0058      Chief Complaint  Patient presents with  . Tingling    (Consider location/radiation/quality/duration/timing/severity/associated sxs/prior treatment) HPI See below.  Past Medical History  Diagnosis Date  . Diabetes mellitus   . Coronary artery disease     No past surgical history on file.  No family history on file.  History  Substance Use Topics  . Smoking status: Not on file  . Smokeless tobacco: Not on file  . Alcohol Use:       Review of Systems  All other systems reviewed and are negative.    Allergies  Ivp dye  Home Medications   Current Outpatient Rx  Name Route Sig Dispense Refill  . ASPIRIN EC 81 MG PO TBEC Oral Take 81 mg by mouth 2 (two) times daily.      . OMEGA-3 FATTY ACIDS 1000 MG PO CAPS Oral Take 1 g by mouth every morning.      . ISOSORBIDE MONONITRATE ER 60 MG PO TB24 Oral Take 60 mg by mouth every morning.     Marland Kitchen METFORMIN HCL 1000 MG PO TABS Oral Take 1,000 mg by mouth 2 (two) times daily with a meal.      . THERA M PLUS PO TABS Oral Take 1 tablet by mouth every morning.      Marland Kitchen NAPROXEN SODIUM 220 MG PO TABS Oral Take 220 mg by mouth 2 (two) times daily.      Marland Kitchen RANOLAZINE 500 MG PO TB12 Oral Take 500 mg by mouth at bedtime.     Marland Kitchen ROSUVASTATIN CALCIUM 20 MG PO TABS Oral Take 10 mg by mouth at bedtime.      Marland Kitchen NITROGLYCERIN 0.4 MG SL SUBL Sublingual Place 0.4 mg under the tongue every 5 (five) minutes as needed. FOR CHEST PAIN       BP 112/90  Pulse 88  Temp(Src) 97.5 F (36.4 C) (Oral)  Resp 19  SpO2 98%  Physical Exam  HPI: This is an 77 year old white male who was at a church function yesterday evening about 7 PM when he developed the sudden onset of numbness in his left upper extremity and to a lesser extent in his left lower extremity. It was severe enough that he had difficulty holding a fork due to  lack of sensation. He denies weakness. There was some vague pain in his upper arm associated with numbness. There was no facial numbness. The symptoms lasted about an hour and resolved after arrival here. At that time his blood pressure was found to be 220/100 but has been lower here since. He's had a mild headache with this. He denies visual changes, chest pain, dyspnea, nausea or vomiting. There were no known mitigating or exacerbating factors.  PE: General: Well-developed, well-nourished male in no acute distress; appearance consistent with age of record HENT: normocephalic, atraumatic Eyes: pupils equal round and reactive to light; extraocular muscles intact Neck: supple; no carotid bruit Heart: regular rate and rhythm; no murmurs; occasional ectopy Lungs: clear to auscultation bilaterally Abdomen: soft; nontender; nondistended Extremities: No deformity; full range of motion; pulses normal; trace edema of lower leg Neurologic: Awake, alert and oriented; motor function intact in all extremities and symmetric; no facial droop; no drift; normal finger-to-nose; sensation intact and symmetric Skin: Warm and dry Psychiatric: Normal mood and affect    ED Course  Procedures (including  critical care time)     MDM   Nursing notes and vitals signs, including pulse oximetry, reviewed.  Summary of this visit's results, reviewed by myself:  Labs:  Results for orders placed during the hospital encounter of 04/21/11  PROTIME-INR      Component Value Range   Prothrombin Time 14.1  11.6 - 15.2 (seconds)   INR 1.07  0.00 - 1.49   APTT      Component Value Range   aPTT 34  24 - 37 (seconds)  CBC      Component Value Range   WBC 5.6  4.0 - 10.5 (K/uL)   RBC 3.95 (*) 4.22 - 5.81 (MIL/uL)   Hemoglobin 11.0 (*) 13.0 - 17.0 (g/dL)   HCT 02.7 (*) 25.3 - 52.0 (%)   MCV 82.5  78.0 - 100.0 (fL)   MCH 27.8  26.0 - 34.0 (pg)   MCHC 33.7  30.0 - 36.0 (g/dL)   RDW 66.4  40.3 - 47.4 (%)   Platelets  194  150 - 400 (K/uL)  DIFFERENTIAL      Component Value Range   Neutrophils Relative 49  43 - 77 (%)   Neutro Abs 2.8  1.7 - 7.7 (K/uL)   Lymphocytes Relative 30  12 - 46 (%)   Lymphs Abs 1.7  0.7 - 4.0 (K/uL)   Monocytes Relative 9  3 - 12 (%)   Monocytes Absolute 0.5  0.1 - 1.0 (K/uL)   Eosinophils Relative 12 (*) 0 - 5 (%)   Eosinophils Absolute 0.7  0.0 - 0.7 (K/uL)   Basophils Relative 1  0 - 1 (%)   Basophils Absolute 0.0  0.0 - 0.1 (K/uL)  COMPREHENSIVE METABOLIC PANEL      Component Value Range   Sodium 136  135 - 145 (mEq/L)   Potassium 3.8  3.5 - 5.1 (mEq/L)   Chloride 101  96 - 112 (mEq/L)   CO2 26  19 - 32 (mEq/L)   Glucose, Bld 158 (*) 70 - 99 (mg/dL)   BUN 14  6 - 23 (mg/dL)   Creatinine, Ser 2.59  0.50 - 1.35 (mg/dL)   Calcium 8.9  8.4 - 56.3 (mg/dL)   Total Protein 6.4  6.0 - 8.3 (g/dL)   Albumin 3.5  3.5 - 5.2 (g/dL)   AST 18  0 - 37 (U/L)   ALT 13  0 - 53 (U/L)   Alkaline Phosphatase 69  39 - 117 (U/L)   Total Bilirubin 0.2 (*) 0.3 - 1.2 (mg/dL)   GFR calc non Af Amer 75 (*) >90 (mL/min)   GFR calc Af Amer 87 (*) >90 (mL/min)  CK TOTAL AND CKMB      Component Value Range   Total CK 81  7 - 232 (U/L)   CK, MB 3.0  0.3 - 4.0 (ng/mL)   Relative Index RELATIVE INDEX IS INVALID  0.0 - 2.5   TROPONIN I      Component Value Range   Troponin I <0.30  <0.30 (ng/mL)  URINALYSIS, ROUTINE W REFLEX MICROSCOPIC      Component Value Range   Color, Urine YELLOW  YELLOW    APPearance CLEAR  CLEAR    Specific Gravity, Urine 1.009  1.005 - 1.030    pH 7.0  5.0 - 8.0    Glucose, UA NEGATIVE  NEGATIVE (mg/dL)   Hgb urine dipstick NEGATIVE  NEGATIVE    Bilirubin Urine NEGATIVE  NEGATIVE    Ketones, ur NEGATIVE  NEGATIVE (mg/dL)   Protein, ur NEGATIVE  NEGATIVE (mg/dL)   Urobilinogen, UA 1.0  0.0 - 1.0 (mg/dL)   Nitrite NEGATIVE  NEGATIVE    Leukocytes, UA NEGATIVE  NEGATIVE     Imaging Studies: Dg Chest 2 View  04/21/2011  *RADIOLOGY REPORT*  Clinical Data:  Hypertension.  Left upper extremity numbness and paresthesias.  CHEST - 2 VIEW 04/21/2011:  Comparison: Two-view chest x-ray 06/18/2009 [facility].  Findings: Cardiac silhouette enlarged but stable.  Thoracic aorta mildly tortuous and atherosclerotic consistent with age, unchanged. Hilar and mediastinal contours otherwise unremarkable.  Lungs clear.  Bronchovascular markings normal.  No pleural effusions. Degenerative changes and dish throughout the thoracic spine.  IMPRESSION: Stable cardiomegaly.  No acute cardiopulmonary disease.  Original Report Authenticated By: Arnell Sieving, M.D.   Ct Head Wo Contrast  04/21/2011  *RADIOLOGY REPORT*  Clinical Data: Acute onset of numbness and tingling in the left arm and leg at 07:30 p.m.  The patient was hypertensive.  CT HEAD WITHOUT CONTRAST  Technique:  Contiguous axial images were obtained from the base of the skull through the vertex without contrast.  Comparison: None.  Findings: Mild diffuse cerebral atrophy.  Mild asymmetric ventricular enlargement on the left, likely due to central atrophy. No mass effect or midline shift.  No abnormal extra-axial fluid collections.  Patchy low attenuation changes in the deep white matter probably due to small vessel ischemic change.  The gray- white matter junctions are distinct.  The basal cisterns are not effaced. No evidence of acute intracranial hemorrhage.  No depressed skull fractures. Visualized mastoid air cells and paranasal sinuses are not opacified.  Vascular calcifications in the intracranial arteries.  IMPRESSION: Mild diffuse atrophy and small vessel ischemic change.  No evidence of acute intracranial hemorrhage, mass lesion, or acute infarct. Note that MRI is more sensitive for detection of acute infarct.  Original Report Authenticated By: Marlon Pel, M.D.   EKG Interpretation:  Date & Time: 04/22/2011 0:01 AM  Rate: 71  Rhythm: normal sinus rhythm  QRS Axis: normal  Intervals: normal   ST/T Wave abnormalities: normal  Conduction Disutrbances:Early precordial R/S transition  Narrative Interpretation:   Old EKG Reviewed: Frequent PVCs seem previously  1:43 AM Neurology will see patient in the morning after his MRI is completed. She will be placed on the CDU TIA protocol.            Hanley Seamen, MD 04/22/11 0143  Hanley Seamen, MD 04/22/11 0981

## 2011-04-22 NOTE — ED Notes (Signed)
Pt ate all of lunch. 

## 2011-04-22 NOTE — ED Provider Notes (Signed)
History     CSN: [id number] Arrival date & time: 04/21/2011 10:02 PM   First MD Initiated Contact with Patient 04/22/11 0058      Chief Complaint  Patient presents with  . Tingling    (Consider location/radiation/quality/duration/timing/severity/associated sxs/prior treatment) HPI  Past Medical History  Diagnosis Date  . Diabetes mellitus   . Coronary artery disease     No past surgical history on file.  No family history on file.  History  Substance Use Topics  . Smoking status: Not on file  . Smokeless tobacco: Not on file  . Alcohol Use:       Review of Systems  Allergies  Ivp dye  Home Medications   Current Outpatient Rx  Name Route Sig Dispense Refill  . ASPIRIN EC 81 MG PO TBEC Oral Take 81 mg by mouth 2 (two) times daily.      . OMEGA-3 FATTY ACIDS 1000 MG PO CAPS Oral Take 1 g by mouth every morning.      . ISOSORBIDE MONONITRATE ER 60 MG PO TB24 Oral Take 60 mg by mouth every morning.     Marland Kitchen METFORMIN HCL 1000 MG PO TABS Oral Take 1,000 mg by mouth 2 (two) times daily with a meal.      . THERA M PLUS PO TABS Oral Take 1 tablet by mouth every morning.      Marland Kitchen NAPROXEN SODIUM 220 MG PO TABS Oral Take 220 mg by mouth 2 (two) times daily.      Marland Kitchen RANOLAZINE 500 MG PO TB12 Oral Take 500 mg by mouth at bedtime.     Marland Kitchen ROSUVASTATIN CALCIUM 20 MG PO TABS Oral Take 10 mg by mouth at bedtime.      Marland Kitchen NITROGLYCERIN 0.4 MG SL SUBL Sublingual Place 0.4 mg under the tongue every 5 (five) minutes as needed. FOR CHEST PAIN       BP 156/75  Pulse 61  Temp(Src) 98.2 F (36.8 C) (Oral)  Resp 19  SpO2 100%  Physical Exam  ED Course  Procedures (including critical care time)  Labs Reviewed  CBC - Abnormal; Notable for the following:    RBC 3.95 (*)    Hemoglobin 11.0 (*)    HCT 32.6 (*)    All other components within normal limits  DIFFERENTIAL - Abnormal; Notable for the following:    Eosinophils Relative 12 (*)    All other components within normal limits    COMPREHENSIVE METABOLIC PANEL - Abnormal; Notable for the following:    Glucose, Bld 158 (*)    Total Bilirubin 0.2 (*)    GFR calc non Af Amer 75 (*)    GFR calc Af Amer 87 (*)    All other components within normal limits  GLUCOSE, CAPILLARY - Abnormal; Notable for the following:    Glucose-Capillary 133 (*)    All other components within normal limits  PROTIME-INR  APTT  CK TOTAL AND CKMB  TROPONIN I  URINALYSIS, ROUTINE W REFLEX MICROSCOPIC  LIPID PANEL  URINE CULTURE  GLUCOSE, POCT (MANUAL RESULT ENTRY)  GLUCOSE, POCT (MANUAL RESULT ENTRY)  GLUCOSE, POCT (MANUAL RESULT ENTRY)  GLUCOSE, POCT (MANUAL RESULT ENTRY)  HEMOGLOBIN A1C   Dg Chest 2 View  04/21/2011  *RADIOLOGY REPORT*  Clinical Data: Hypertension.  Left upper extremity numbness and paresthesias.  CHEST - 2 VIEW 04/21/2011:  Comparison: Two-view chest x-ray 06/18/2009 [facility].  Findings: Cardiac silhouette enlarged but stable.  Thoracic aorta mildly tortuous and atherosclerotic consistent with  age, unchanged. Hilar and mediastinal contours otherwise unremarkable.  Lungs clear.  Bronchovascular markings normal.  No pleural effusions. Degenerative changes and dish throughout the thoracic spine.  IMPRESSION: Stable cardiomegaly.  No acute cardiopulmonary disease.  Original Report Authenticated By: Arnell Sieving, M.D.   Ct Head Wo Contrast  04/21/2011  *RADIOLOGY REPORT*  Clinical Data: Acute onset of numbness and tingling in the left arm and leg at 07:30 p.m.  The patient was hypertensive.  CT HEAD WITHOUT CONTRAST  Technique:  Contiguous axial images were obtained from the base of the skull through the vertex without contrast.  Comparison: None.  Findings: Mild diffuse cerebral atrophy.  Mild asymmetric ventricular enlargement on the left, likely due to central atrophy. No mass effect or midline shift.  No abnormal extra-axial fluid collections.  Patchy low attenuation changes in the deep white matter probably  due to small vessel ischemic change.  The gray- white matter junctions are distinct.  The basal cisterns are not effaced. No evidence of acute intracranial hemorrhage.  No depressed skull fractures. Visualized mastoid air cells and paranasal sinuses are not opacified.  Vascular calcifications in the intracranial arteries.  IMPRESSION: Mild diffuse atrophy and small vessel ischemic change.  No evidence of acute intracranial hemorrhage, mass lesion, or acute infarct. Note that MRI is more sensitive for detection of acute infarct.  Original Report Authenticated By: Marlon Pel, M.D.   Mr Angiogram Neck W Wo Contrast  04/22/2011  *RADIOLOGY REPORT*  Clinical Data:  TIA.  Sudden onset left upper extremity and left lower extremity numbness last night.  The symptoms resolved after proximally 1 hour.  Marked hypertension.  MRI HEAD WITHOUT AND WITH CONTRAST MRA HEAD WITHOUT CONTRAST MRA NECK WITHOUT AND WITH CONTRAST  Technique:  Multiplanar, multiecho pulse sequences of the brain and surrounding structures were obtained without and with intravenous contrast.  Angiographic images of the Circle of Willis were obtained using MRA technique without intravenous contrast. Angiographic images of the neck were obtained using MRA technique without and with intravenous contrast.  Carotid stenosis measurements (when applicable) are obtained utilizing NASCET criteria, using the distal internal carotid diameter as the denominator.  Contrast: 20mL MULTIHANCE GADOBENATE DIMEGLUMINE 529 MG/ML IV SOLN  Comparison:  CT head without contrast 04/21/2011 at [facility].  MRI HEAD  Findings:  A 9 mm non hemorrhagic acute cortical infarct is present to along the posterior right frontal lobe.  There is an additional punctate area of restricted diffusion within the next gyrus, likely the primary sensory strip.  No hemorrhage or mass lesion is present.  Mild generalized atrophy is present.  Mild periventricular white matter changes  are likely within normal limits for age.  There is a remote lacunar white matter infarct of the left corona radiata.  The ventricles are proportionate to the degree of atrophy.  No significant extra-axial fluid collection is present.  Flow is present in the major intracranial arteries.  The globes and orbits are intact.  A small polyp or mucous retention cyst is present in the right maxillary sinus.  The paranasal sinuses and mastoid air cells are otherwise clear.  The postcontrast images are moderately degraded by patient motion. A benign venous angioma is present within the left cerebellum and extending along the vermis.  The postcontrast images demonstrate no other areas of pathologic enhancement.  There is no enhancement associated with the areas of acute infarction.  IMPRESSION:  1.  Acute non hemorrhagic cortical infarct involving the posterior right frontal lobe.  2.  Second punctate area of acute non hemorrhagic infarction within the anterior right parietal lobe.  3.  Mild atrophy white matter disease is likely within normal limits for age. 4.  Remote lacunar infarct of the left corona radiata.  MRA HEAD  Findings: The internal carotid arteries are within normal limits bilaterally.  The A1 and M1 segments are normal.  The anterior communicating artery is patent.  There is an early bifurcation of the right A2 segment, a normal variant.  Segmental irregularities are noted within the ACA and MCA branch vessels without significant proximal stenosis or occlusion.  There is no aneurysm.  The left vertebral artery is slightly dominant to the right.  The left PICA origin is visualized and normal.  The right AICA is dominant.  The basilar artery is normal.  Both posterior cerebral arteries originate from the basilar tip.  PCA branch vessels are within normal limits bilaterally.  IMPRESSION:  1.  No significant proximal stenosis, aneurysm, or branch vessel occlusion. 2.  Mild distal small vessel disease, likely within  normal limits for age.  MRA NECK  Findings: The time-of-flight images demonstrate no significant flow disturbance at either carotid bifurcation.  Flow is antegrade within the vertebral arteries bilaterally.  A standard three-vessel arch configuration is present.  Narrowing of the proximal right vertebral artery extends for 6 mm, and measures approximately 60% relative to the distal vessel.  The left vertebral artery origin is mildly tortuous but without stenosis.  The vertebral arteries are otherwise within normal limits throughout their course in the neck.  There is some signal loss within the proximal right common carotid artery, felt be artifactual.  The right common carotid artery is otherwise within normal limits.  There is no significant stenosis at the bifurcation.  Minimal atherosclerotic irregularity is evident along the medial wall of the proximal internal carotid artery without significant stenosis.  The cervical right internal carotid artery is otherwise normal.  The left common carotid artery is normal.  There is a medial plaque along the proximal left internal carotid artery without significant stenosis relative to the distal vessel. The cervical left internal carotid artery is otherwise normal.  IMPRESSION:  1.  60% stenosis of the proximal right vertebral artery. 2.  Mild atherosclerotic irregularity at the carotid bifurcations bilaterally without significant stenosis relative to the distal vessels.  These results were called by telephone on 04/22/2011  at  09:55 a.m. to  Jacelyn Pi, PA, who verbally acknowledged these results.  Original Report Authenticated By: Jamesetta Orleans. MATTERN, M.D.   Mr Laqueta Jean NW Contrast  04/22/2011  *RADIOLOGY REPORT*  Clinical Data:  TIA.  Sudden onset left upper extremity and left lower extremity numbness last night.  The symptoms resolved after proximally 1 hour.  Marked hypertension.  MRI HEAD WITHOUT AND WITH CONTRAST MRA HEAD WITHOUT CONTRAST MRA NECK  WITHOUT AND WITH CONTRAST  Technique:  Multiplanar, multiecho pulse sequences of the brain and surrounding structures were obtained without and with intravenous contrast.  Angiographic images of the Circle of Willis were obtained using MRA technique without intravenous contrast. Angiographic images of the neck were obtained using MRA technique without and with intravenous contrast.  Carotid stenosis measurements (when applicable) are obtained utilizing NASCET criteria, using the distal internal carotid diameter as the denominator.  Contrast: 20mL MULTIHANCE GADOBENATE DIMEGLUMINE 529 MG/ML IV SOLN  Comparison:  CT head without contrast 04/21/2011 at [facility].  MRI HEAD  Findings:  A 9 mm non hemorrhagic acute cortical  infarct is present to along the posterior right frontal lobe.  There is an additional punctate area of restricted diffusion within the next gyrus, likely the primary sensory strip.  No hemorrhage or mass lesion is present.  Mild generalized atrophy is present.  Mild periventricular white matter changes are likely within normal limits for age.  There is a remote lacunar white matter infarct of the left corona radiata.  The ventricles are proportionate to the degree of atrophy.  No significant extra-axial fluid collection is present.  Flow is present in the major intracranial arteries.  The globes and orbits are intact.  A small polyp or mucous retention cyst is present in the right maxillary sinus.  The paranasal sinuses and mastoid air cells are otherwise clear.  The postcontrast images are moderately degraded by patient motion. A benign venous angioma is present within the left cerebellum and extending along the vermis.  The postcontrast images demonstrate no other areas of pathologic enhancement.  There is no enhancement associated with the areas of acute infarction.  IMPRESSION:  1.  Acute non hemorrhagic cortical infarct involving the posterior right frontal lobe. 2.  Second punctate  area of acute non hemorrhagic infarction within the anterior right parietal lobe.  3.  Mild atrophy white matter disease is likely within normal limits for age. 4.  Remote lacunar infarct of the left corona radiata.  MRA HEAD  Findings: The internal carotid arteries are within normal limits bilaterally.  The A1 and M1 segments are normal.  The anterior communicating artery is patent.  There is an early bifurcation of the right A2 segment, a normal variant.  Segmental irregularities are noted within the ACA and MCA branch vessels without significant proximal stenosis or occlusion.  There is no aneurysm.  The left vertebral artery is slightly dominant to the right.  The left PICA origin is visualized and normal.  The right AICA is dominant.  The basilar artery is normal.  Both posterior cerebral arteries originate from the basilar tip.  PCA branch vessels are within normal limits bilaterally.  IMPRESSION:  1.  No significant proximal stenosis, aneurysm, or branch vessel occlusion. 2.  Mild distal small vessel disease, likely within normal limits for age.  MRA NECK  Findings: The time-of-flight images demonstrate no significant flow disturbance at either carotid bifurcation.  Flow is antegrade within the vertebral arteries bilaterally.  A standard three-vessel arch configuration is present.  Narrowing of the proximal right vertebral artery extends for 6 mm, and measures approximately 60% relative to the distal vessel.  The left vertebral artery origin is mildly tortuous but without stenosis.  The vertebral arteries are otherwise within normal limits throughout their course in the neck.  There is some signal loss within the proximal right common carotid artery, felt be artifactual.  The right common carotid artery is otherwise within normal limits.  There is no significant stenosis at the bifurcation.  Minimal atherosclerotic irregularity is evident along the medial wall of the proximal internal carotid artery without  significant stenosis.  The cervical right internal carotid artery is otherwise normal.  The left common carotid artery is normal.  There is a medial plaque along the proximal left internal carotid artery without significant stenosis relative to the distal vessel. The cervical left internal carotid artery is otherwise normal.  IMPRESSION:  1.  60% stenosis of the proximal right vertebral artery. 2.  Mild atherosclerotic irregularity at the carotid bifurcations bilaterally without significant stenosis relative to the distal vessels.  These results  were called by telephone on 04/22/2011  at  09:55 a.m. to  Jacelyn Pi, PA, who verbally acknowledged these results.  Original Report Authenticated By: Jamesetta Orleans. MATTERN, M.D.   Mr Maxine Glenn Head/brain Wo Cm  04/22/2011  *RADIOLOGY REPORT*  Clinical Data:  TIA.  Sudden onset left upper extremity and left lower extremity numbness last night.  The symptoms resolved after proximally 1 hour.  Marked hypertension.  MRI HEAD WITHOUT AND WITH CONTRAST MRA HEAD WITHOUT CONTRAST MRA NECK WITHOUT AND WITH CONTRAST  Technique:  Multiplanar, multiecho pulse sequences of the brain and surrounding structures were obtained without and with intravenous contrast.  Angiographic images of the Circle of Willis were obtained using MRA technique without intravenous contrast. Angiographic images of the neck were obtained using MRA technique without and with intravenous contrast.  Carotid stenosis measurements (when applicable) are obtained utilizing NASCET criteria, using the distal internal carotid diameter as the denominator.  Contrast: 20mL MULTIHANCE GADOBENATE DIMEGLUMINE 529 MG/ML IV SOLN  Comparison:  CT head without contrast 04/21/2011 at [facility].  MRI HEAD  Findings:  A 9 mm non hemorrhagic acute cortical infarct is present to along the posterior right frontal lobe.  There is an additional punctate area of restricted diffusion within the next gyrus, likely the primary  sensory strip.  No hemorrhage or mass lesion is present.  Mild generalized atrophy is present.  Mild periventricular white matter changes are likely within normal limits for age.  There is a remote lacunar white matter infarct of the left corona radiata.  The ventricles are proportionate to the degree of atrophy.  No significant extra-axial fluid collection is present.  Flow is present in the major intracranial arteries.  The globes and orbits are intact.  A small polyp or mucous retention cyst is present in the right maxillary sinus.  The paranasal sinuses and mastoid air cells are otherwise clear.  The postcontrast images are moderately degraded by patient motion. A benign venous angioma is present within the left cerebellum and extending along the vermis.  The postcontrast images demonstrate no other areas of pathologic enhancement.  There is no enhancement associated with the areas of acute infarction.  IMPRESSION:  1.  Acute non hemorrhagic cortical infarct involving the posterior right frontal lobe. 2.  Second punctate area of acute non hemorrhagic infarction within the anterior right parietal lobe.  3.  Mild atrophy white matter disease is likely within normal limits for age. 4.  Remote lacunar infarct of the left corona radiata.  MRA HEAD  Findings: The internal carotid arteries are within normal limits bilaterally.  The A1 and M1 segments are normal.  The anterior communicating artery is patent.  There is an early bifurcation of the right A2 segment, a normal variant.  Segmental irregularities are noted within the ACA and MCA branch vessels without significant proximal stenosis or occlusion.  There is no aneurysm.  The left vertebral artery is slightly dominant to the right.  The left PICA origin is visualized and normal.  The right AICA is dominant.  The basilar artery is normal.  Both posterior cerebral arteries originate from the basilar tip.  PCA branch vessels are within normal limits bilaterally.   IMPRESSION:  1.  No significant proximal stenosis, aneurysm, or branch vessel occlusion. 2.  Mild distal small vessel disease, likely within normal limits for age.  MRA NECK  Findings: The time-of-flight images demonstrate no significant flow disturbance at either carotid bifurcation.  Flow is antegrade within the vertebral arteries  bilaterally.  A standard three-vessel arch configuration is present.  Narrowing of the proximal right vertebral artery extends for 6 mm, and measures approximately 60% relative to the distal vessel.  The left vertebral artery origin is mildly tortuous but without stenosis.  The vertebral arteries are otherwise within normal limits throughout their course in the neck.  There is some signal loss within the proximal right common carotid artery, felt be artifactual.  The right common carotid artery is otherwise within normal limits.  There is no significant stenosis at the bifurcation.  Minimal atherosclerotic irregularity is evident along the medial wall of the proximal internal carotid artery without significant stenosis.  The cervical right internal carotid artery is otherwise normal.  The left common carotid artery is normal.  There is a medial plaque along the proximal left internal carotid artery without significant stenosis relative to the distal vessel. The cervical left internal carotid artery is otherwise normal.  IMPRESSION:  1.  60% stenosis of the proximal right vertebral artery. 2.  Mild atherosclerotic irregularity at the carotid bifurcations bilaterally without significant stenosis relative to the distal vessels.  These results were called by telephone on 04/22/2011  at  09:55 a.m. to  Jacelyn Pi, PA, who verbally acknowledged these results.  Original Report Authenticated By: Jamesetta Orleans. MATTERN, M.D.     No diagnosis found.  ECG shows normal sinus rhythm with a rate of 62, no ectopy. Normal axis. Normal P wave. Normal QRS. Normal intervals. Normal ST and T  waves. Impression: normal ECG. No old tracing available for comparison.   MDM          Dione Booze, MD 04/22/11 1013

## 2011-04-22 NOTE — ED Provider Notes (Signed)
Medical screening examination/treatment/procedure(s) were performed by non-physician practitioner and as supervising physician I was immediately available for consultation/collaboration.  Ethelda Chick, MD 04/22/11 [phone]

## 2011-04-22 NOTE — ED Notes (Signed)
cbg done when pt returned to [facility] and it was 133.

## 2011-04-22 NOTE — H&P (Signed)
Hospital Admission Note Date: 04/22/2011  PCP: No primary provider on file.  Chief Complaint: Left arm weakness and numbness.  History of Present Illness: This is a very pleasant 77 year old male with past medical history significant for diabetes and coronary artery disease who presents to the emergency apartment complaining of left arm numbness and weakness, also left lower extremity numbness. Patient relate that he was in his usual state of health until the night prior to admission around 7:30 PM when he started to have the above mentioned symptoms. The episode lasted for 3-1/2 hour. He noted that he was having difficulty using the fork to eat. He relates that the left upper and lower extremity numbness has resolved. He is still feel some weakness on his left arm.  He also started to have while in the emergency department chest pressure, he usually gets some chest pressure on and off, his cardiology started  him Ranexa, chest pressure has resolved at this time. Chest pressure started after he had the MRI done.  He denies shortness of breath, speech difficulty, vision changes. He was taking a baby aspirin twice a day.  Allergies: Ivp dye   PAST MEDICAL HISTORY:   1. History of prostate cancer.   2. Diabetes mellitus.   3. Hyperlipidemia.   4. History of back surgery.    5. Coronary artery diseases.    Prior to Admission medications   Medication Sig Start Date End Date Taking? Authorizing Provider  aspirin EC 81 MG tablet Take 81 mg by mouth 2 (two) times daily.     Yes Historical Provider, MD  fish oil-omega-3 fatty acids 1000 MG capsule Take 1 g by mouth every morning.     Yes Historical Provider, MD  isosorbide mononitrate (IMDUR) 60 MG 24 hr tablet Take 60 mg by mouth every morning.  09/20/10  Yes Elyn Aquas., MD  metFORMIN (GLUCOPHAGE) 1000 MG tablet Take 1,000 mg by mouth 2 (two) times daily with a meal.     Yes Historical Provider, MD  Multiple Vitamins-Minerals (MULTIVITAMINS  THER. W/MINERALS) TABS Take 1 tablet by mouth every morning.     Yes Historical Provider, MD  naproxen sodium (ANAPROX) 220 MG tablet Take 220 mg by mouth 2 (two) times daily.     Yes Historical Provider, MD  ranolazine (RANEXA) 500 MG 12 hr tablet Take 500 mg by mouth at bedtime.  02/08/11  Yes Elyn Aquas., MD  rosuvastatin (CRESTOR) 20 MG tablet Take 10 mg by mouth at bedtime.     Yes Historical Provider, MD  nitroGLYCERIN (NITROSTAT) 0.4 MG SL tablet Place 0.4 mg under the tongue every 5 (five) minutes as needed. FOR CHEST PAIN     Historical Provider, MD    FAMILY HISTORY:  Significant for heart disease, stroke, diabetes,   hypertension and hypercholesterolemia.     SOCIAL HISTORY:  The patient quit smoking in 1961.  He does not drink   alcohol.  He is a retired Geophysicist/field seismologist.  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     Review of Systems: Pertinent items are noted in HPI. Physical Exam: Filed Vitals:   04/22/11 1000 04/22/11 1100 04/22/11 1145 04/22/11 1223  BP: 156/75 152/79 155/74 155/56  Pulse: 61 62 62 61  Temp:    98.2 F (36.8 C)  TempSrc:    Oral  Resp: 19 19  18   SpO2: 100% 99% 99% 99%    Intake/Output Summary (Last 24 hours) at 04/22/11 1347 Last  data filed at 04/22/11 1319  Gross per 24 hour  Intake      0 ml  Output    500 ml  Net   -500 ml   BP 155/56  Pulse 61  Temp(Src) 98.2 F (36.8 C) (Oral)  Resp 18  SpO2 99%  General Appearance:    Alert, cooperative, no distress, appears stated age  Head:    Normocephalic, without obvious abnormality, atraumatic  Eyes:    PERRL, conjunctiva/corneas clear, EOM's intact, fundi    benign, both eyes       Ears:    Normal TM's and external ear canals, both ears  Nose:   Nares normal, septum midline, mucosa normal, no drainage    or sinus tenderness  Throat:   Lips, mucosa, and tongue normal; teeth and gums normal  Neck:   Supple, symmetrical, trachea midline, no adenopathy;       thyroid:  No  enlargement/tenderness/nodules; no carotid   bruit or JVD  Back:     Symmetric, no curvature, ROM normal, no CVA tenderness  Lungs:     Clear to auscultation bilaterally, respirations unlabored     Heart:    Regular rate and rhythm, S1 and S2 normal, no murmur, rub   or gallop  Abdomen:     Soft, non-tender, bowel sounds active all four quadrants,    no masses, no organomegaly        Extremities:   Extremities normal, atraumatic, no cyanosis or edema  Pulses:   2+ and symmetric all extremities  Skin:   Skin color, texture, turgor normal, no rashes or lesions  Lymph nodes:   Cervical, supraclavicular, and axillary nodes normal  Neurologic:   CNII-XII intact. Normal strength right side. Motor strength left upper arm 4-5/5, sensation and reflexes      throughout   Lab results:  [facility] 04/21/11 2236  NA 136  K 3.8  CL 101  CO2 26  GLUCOSE 158*  BUN 14  CREATININE 0.94  CALCIUM 8.9  MG --  PHOS --    Basename 04/21/11 2236  AST 18  ALT 13  ALKPHOS 69  BILITOT 0.2*  PROT 6.4  ALBUMIN 3.5   Basename 04/21/11 2236  WBC 5.6  NEUTROABS 2.8  HGB 11.0*  HCT 32.6*  MCV 82.5  PLT 194    Basename 04/22/11 1107 04/21/11 2236  CKTOTAL -- 81  CKMB -- 3.0  CKMBINDEX -- --  TROPONINI <0.30 <0.30    Basename 04/21/11 2236  HGBA1C 6.7*    Basename 04/22/11 0505  CHOL 118  HDL 44  LDLCALC 56  TRIG 91  CHOLHDL 2.7  LDLDIRECT --   Imaging results:  Dg Chest 2 View  04/21/2011  *RADIOLOGY REPORT*  Clinical Data: Hypertension.  Left upper extremity numbness and paresthesias.  CHEST - 2 VIEW 04/21/2011:  Comparison: Two-view chest x-ray 06/18/2009 [facility].  Findings: Cardiac silhouette enlarged but stable.  Thoracic aorta mildly tortuous and atherosclerotic consistent with age, unchanged. Hilar and mediastinal contours otherwise unremarkable.  Lungs clear.  Bronchovascular markings normal.  No pleural effusions. Degenerative changes and dish throughout the  thoracic spine.  IMPRESSION: Stable cardiomegaly.  No acute cardiopulmonary disease.  Original Report Authenticated By: Arnell Sieving, M.D.   Ct Head Wo Contrast  04/21/2011  *RADIOLOGY REPORT*  Clinical Data: Acute onset of numbness and tingling in the left arm and leg at 07:30 p.m.  The patient was hypertensive.  CT HEAD WITHOUT CONTRAST  Technique:  Contiguous  axial images were obtained from the base of the skull through the vertex without contrast.  Comparison: None.  Findings: Mild diffuse cerebral atrophy.  Mild asymmetric ventricular enlargement on the left, likely due to central atrophy. No mass effect or midline shift.  No abnormal extra-axial fluid collections.  Patchy low attenuation changes in the deep white matter probably due to small vessel ischemic change.  The gray- white matter junctions are distinct.  The basal cisterns are not effaced. No evidence of acute intracranial hemorrhage.  No depressed skull fractures. Visualized mastoid air cells and paranasal sinuses are not opacified.  Vascular calcifications in the intracranial arteries.  IMPRESSION: Mild diffuse atrophy and small vessel ischemic change.  No evidence of acute intracranial hemorrhage, mass lesion, or acute infarct. Note that MRI is more sensitive for detection of acute infarct.  Original Report Authenticated By: Marlon Pel, M.D.   Mr Angiogram Neck W Wo Contrast  04/22/2011  *RADIOLOGY REPORT*  Clinical Data:  TIA.  Sudden onset left upper extremity and left lower extremity numbness last night.  The symptoms resolved after proximally 1 hour.  Marked hypertension.  MRI HEAD WITHOUT AND WITH CONTRAST MRA HEAD WITHOUT CONTRAST MRA NECK WITHOUT AND WITH CONTRAST  Technique:  Multiplanar, multiecho pulse sequences of the brain and surrounding structures were obtained without and with intravenous contrast.  Angiographic images of the Circle of Willis were obtained using MRA technique without intravenous contrast.  Angiographic images of the neck were obtained using MRA technique without and with intravenous contrast.  Carotid stenosis measurements (when applicable) are obtained utilizing NASCET criteria, using the distal internal carotid diameter as the denominator.  Contrast: 20mL MULTIHANCE GADOBENATE DIMEGLUMINE 529 MG/ML IV SOLN  Comparison:  CT head without contrast 04/21/2011 at [facility].  MRI HEAD  Findings:  A 9 mm non hemorrhagic acute cortical infarct is present to along the posterior right frontal lobe.  There is an additional punctate area of restricted diffusion within the next gyrus, likely the primary sensory strip.  No hemorrhage or mass lesion is present.  Mild generalized atrophy is present.  Mild periventricular white matter changes are likely within normal limits for age.  There is a remote lacunar white matter infarct of the left corona radiata.  The ventricles are proportionate to the degree of atrophy.  No significant extra-axial fluid collection is present.  Flow is present in the major intracranial arteries.  The globes and orbits are intact.  A small polyp or mucous retention cyst is present in the right maxillary sinus.  The paranasal sinuses and mastoid air cells are otherwise clear.  The postcontrast images are moderately degraded by patient motion. A benign venous angioma is present within the left cerebellum and extending along the vermis.  The postcontrast images demonstrate no other areas of pathologic enhancement.  There is no enhancement associated with the areas of acute infarction.  IMPRESSION:  1.  Acute non hemorrhagic cortical infarct involving the posterior right frontal lobe. 2.  Second punctate area of acute non hemorrhagic infarction within the anterior right parietal lobe.  3.  Mild atrophy white matter disease is likely within normal limits for age. 4.  Remote lacunar infarct of the left corona radiata.  MRA HEAD  Findings: The internal carotid arteries are within  normal limits bilaterally.  The A1 and M1 segments are normal.  The anterior communicating artery is patent.  There is an early bifurcation of the right A2 segment, a normal variant.  Segmental irregularities are  noted within the ACA and MCA branch vessels without significant proximal stenosis or occlusion.  There is no aneurysm.  The left vertebral artery is slightly dominant to the right.  The left PICA origin is visualized and normal.  The right AICA is dominant.  The basilar artery is normal.  Both posterior cerebral arteries originate from the basilar tip.  PCA branch vessels are within normal limits bilaterally.  IMPRESSION:  1.  No significant proximal stenosis, aneurysm, or branch vessel occlusion. 2.  Mild distal small vessel disease, likely within normal limits for age.  MRA NECK  Findings: The time-of-flight images demonstrate no significant flow disturbance at either carotid bifurcation.  Flow is antegrade within the vertebral arteries bilaterally.  A standard three-vessel arch configuration is present.  Narrowing of the proximal right vertebral artery extends for 6 mm, and measures approximately 60% relative to the distal vessel.  The left vertebral artery origin is mildly tortuous but without stenosis.  The vertebral arteries are otherwise within normal limits throughout their course in the neck.  There is some signal loss within the proximal right common carotid artery, felt be artifactual.  The right common carotid artery is otherwise within normal limits.  There is no significant stenosis at the bifurcation.  Minimal atherosclerotic irregularity is evident along the medial wall of the proximal internal carotid artery without significant stenosis.  The cervical right internal carotid artery is otherwise normal.  The left common carotid artery is normal.  There is a medial plaque along the proximal left internal carotid artery without significant stenosis relative to the distal vessel. The cervical  left internal carotid artery is otherwise normal.  IMPRESSION:  1.  60% stenosis of the proximal right vertebral artery. 2.  Mild atherosclerotic irregularity at the carotid bifurcations bilaterally without significant stenosis relative to the distal vessels.  These results were called by telephone on 04/22/2011  at  09:55 a.m. to  Jacelyn Pi, PA, who verbally acknowledged these results.  Original Report Authenticated By: Jamesetta Orleans. MATTERN, M.D.   Mr Laqueta Jean WU Contrast  04/22/2011  *RADIOLOGY REPORT*  Clinical Data:  TIA.  Sudden onset left upper extremity and left lower extremity numbness last night.  The symptoms resolved after proximally 1 hour.  Marked hypertension.  MRI HEAD WITHOUT AND WITH CONTRAST MRA HEAD WITHOUT CONTRAST MRA NECK WITHOUT AND WITH CONTRAST  Technique:  Multiplanar, multiecho pulse sequences of the brain and surrounding structures were obtained without and with intravenous contrast.  Angiographic images of the Circle of Willis were obtained using MRA technique without intravenous contrast. Angiographic images of the neck were obtained using MRA technique without and with intravenous contrast.  Carotid stenosis measurements (when applicable) are obtained utilizing NASCET criteria, using the distal internal carotid diameter as the denominator.  Contrast: 20mL MULTIHANCE GADOBENATE DIMEGLUMINE 529 MG/ML IV SOLN  Comparison:  CT head without contrast 04/21/2011 at [facility].  MRI HEAD  Findings:  A 9 mm non hemorrhagic acute cortical infarct is present to along the posterior right frontal lobe.  There is an additional punctate area of restricted diffusion within the next gyrus, likely the primary sensory strip.  No hemorrhage or mass lesion is present.  Mild generalized atrophy is present.  Mild periventricular white matter changes are likely within normal limits for age.  There is a remote lacunar white matter infarct of the left corona radiata.  The ventricles are  proportionate to the degree of atrophy.  No significant extra-axial fluid collection is present.  Flow is present in the major intracranial arteries.  The globes and orbits are intact.  A small polyp or mucous retention cyst is present in the right maxillary sinus.  The paranasal sinuses and mastoid air cells are otherwise clear.  The postcontrast images are moderately degraded by patient motion. A benign venous angioma is present within the left cerebellum and extending along the vermis.  The postcontrast images demonstrate no other areas of pathologic enhancement.  There is no enhancement associated with the areas of acute infarction.  IMPRESSION:  1.  Acute non hemorrhagic cortical infarct involving the posterior right frontal lobe. 2.  Second punctate area of acute non hemorrhagic infarction within the anterior right parietal lobe.  3.  Mild atrophy white matter disease is likely within normal limits for age. 4.  Remote lacunar infarct of the left corona radiata.  MRA HEAD  Findings: The internal carotid arteries are within normal limits bilaterally.  The A1 and M1 segments are normal.  The anterior communicating artery is patent.  There is an early bifurcation of the right A2 segment, a normal variant.  Segmental irregularities are noted within the ACA and MCA branch vessels without significant proximal stenosis or occlusion.  There is no aneurysm.  The left vertebral artery is slightly dominant to the right.  The left PICA origin is visualized and normal.  The right AICA is dominant.  The basilar artery is normal.  Both posterior cerebral arteries originate from the basilar tip.  PCA branch vessels are within normal limits bilaterally.  IMPRESSION:  1.  No significant proximal stenosis, aneurysm, or branch vessel occlusion. 2.  Mild distal small vessel disease, likely within normal limits for age.  MRA NECK  Findings: The time-of-flight images demonstrate no significant flow disturbance at either carotid  bifurcation.  Flow is antegrade within the vertebral arteries bilaterally.  A standard three-vessel arch configuration is present.  Narrowing of the proximal right vertebral artery extends for 6 mm, and measures approximately 60% relative to the distal vessel.  The left vertebral artery origin is mildly tortuous but without stenosis.  The vertebral arteries are otherwise within normal limits throughout their course in the neck.  There is some signal loss within the proximal right common carotid artery, felt be artifactual.  The right common carotid artery is otherwise within normal limits.  There is no significant stenosis at the bifurcation.  Minimal atherosclerotic irregularity is evident along the medial wall of the proximal internal carotid artery without significant stenosis.  The cervical right internal carotid artery is otherwise normal.  The left common carotid artery is normal.  There is a medial plaque along the proximal left internal carotid artery without significant stenosis relative to the distal vessel. The cervical left internal carotid artery is otherwise normal.  IMPRESSION:  1.  60% stenosis of the proximal right vertebral artery. 2.  Mild atherosclerotic irregularity at the carotid bifurcations bilaterally without significant stenosis relative to the distal vessels.  These results were called by telephone on 04/22/2011  at  09:55 a.m. to  Jacelyn Pi, PA, who verbally acknowledged these results.  Original Report Authenticated By: Jamesetta Orleans. MATTERN, M.D.   Mr Maxine Glenn Head/brain Wo Cm  04/22/2011  *RADIOLOGY REPORT*  Clinical Data:  TIA.  Sudden onset left upper extremity and left lower extremity numbness last night.  The symptoms resolved after proximally 1 hour.  Marked hypertension.  MRI HEAD WITHOUT AND WITH CONTRAST MRA HEAD WITHOUT CONTRAST MRA NECK WITHOUT AND WITH CONTRAST  Technique:  Multiplanar, multiecho pulse sequences of the brain and surrounding structures were obtained  without and with intravenous contrast.  Angiographic images of the Circle of Willis were obtained using MRA technique without intravenous contrast. Angiographic images of the neck were obtained using MRA technique without and with intravenous contrast.  Carotid stenosis measurements (when applicable) are obtained utilizing NASCET criteria, using the distal internal carotid diameter as the denominator.  Contrast: 20mL MULTIHANCE GADOBENATE DIMEGLUMINE 529 MG/ML IV SOLN  Comparison:  CT head without contrast 04/21/2011 at [facility].  MRI HEAD  Findings:  A 9 mm non hemorrhagic acute cortical infarct is present to along the posterior right frontal lobe.  There is an additional punctate area of restricted diffusion within the next gyrus, likely the primary sensory strip.  No hemorrhage or mass lesion is present.  Mild generalized atrophy is present.  Mild periventricular white matter changes are likely within normal limits for age.  There is a remote lacunar white matter infarct of the left corona radiata.  The ventricles are proportionate to the degree of atrophy.  No significant extra-axial fluid collection is present.  Flow is present in the major intracranial arteries.  The globes and orbits are intact.  A small polyp or mucous retention cyst is present in the right maxillary sinus.  The paranasal sinuses and mastoid air cells are otherwise clear.  The postcontrast images are moderately degraded by patient motion. A benign venous angioma is present within the left cerebellum and extending along the vermis.  The postcontrast images demonstrate no other areas of pathologic enhancement.  There is no enhancement associated with the areas of acute infarction.  IMPRESSION:  1.  Acute non hemorrhagic cortical infarct involving the posterior right frontal lobe. 2.  Second punctate area of acute non hemorrhagic infarction within the anterior right parietal lobe.  3.  Mild atrophy white matter disease is likely  within normal limits for age. 4.  Remote lacunar infarct of the left corona radiata.  MRA HEAD  Findings: The internal carotid arteries are within normal limits bilaterally.  The A1 and M1 segments are normal.  The anterior communicating artery is patent.  There is an early bifurcation of the right A2 segment, a normal variant.  Segmental irregularities are noted within the ACA and MCA branch vessels without significant proximal stenosis or occlusion.  There is no aneurysm.  The left vertebral artery is slightly dominant to the right.  The left PICA origin is visualized and normal.  The right AICA is dominant.  The basilar artery is normal.  Both posterior cerebral arteries originate from the basilar tip.  PCA branch vessels are within normal limits bilaterally.  IMPRESSION:  1.  No significant proximal stenosis, aneurysm, or branch vessel occlusion. 2.  Mild distal small vessel disease, likely within normal limits for age.  MRA NECK  Findings: The time-of-flight images demonstrate no significant flow disturbance at either carotid bifurcation.  Flow is antegrade within the vertebral arteries bilaterally.  A standard three-vessel arch configuration is present.  Narrowing of the proximal right vertebral artery extends for 6 mm, and measures approximately 60% relative to the distal vessel.  The left vertebral artery origin is mildly tortuous but without stenosis.  The vertebral arteries are otherwise within normal limits throughout their course in the neck.  There is some signal loss within the proximal right common carotid artery, felt be artifactual.  The right common carotid artery is otherwise within normal limits.  There is no significant stenosis at  the bifurcation.  Minimal atherosclerotic irregularity is evident along the medial wall of the proximal internal carotid artery without significant stenosis.  The cervical right internal carotid artery is otherwise normal.  The left common carotid artery is normal.   There is a medial plaque along the proximal left internal carotid artery without significant stenosis relative to the distal vessel. The cervical left internal carotid artery is otherwise normal.  IMPRESSION:  1.  60% stenosis of the proximal right vertebral artery. 2.  Mild atherosclerotic irregularity at the carotid bifurcations bilaterally without significant stenosis relative to the distal vessels.  These results were called by telephone on 04/22/2011  at  09:55 a.m. to  Jacelyn Pi, PA, who verbally acknowledged these results.  Original Report Authenticated By: Jamesetta Orleans. MATTERN, M.D.      Patient Active Hospital Problem List:  Stroke (04/22/2011)  Admit patient to telemetry. Neurology already evaluated the patient, they recommended Plavix for secondary stroke prevention . I will continue with aspirin . Will check 2-D echo, fasting lipid panel, hemoglobin A1c . Will ask for physical therapist occupational therapy and speech therapy evaluation. MRA with vertebral artery stenosis, will follow neurology recommendation . Chest pain (04/22/2011)  Probably angina equivalent. Will restart his Ranexa, and Imdur. Nitroglycerin PRN. Continue with aspirin. Will check EKG . Chest pain has resolved. Will give one dose of Solu-Medrol in case  allergy after receiving contrast for  MRI.   Hypertension (04/22/2011)  Will avoid to decrease blood pressure in event of acute stroke.    REGALADO,BELKYS M.D. [facility] [phone] 04/22/2011, 1:47 PM

## 2011-04-22 NOTE — Consult Note (Addendum)
Reason for Consult:"trasient slurred speech and left facial droop and hemiparesis last night"  HPI: Garrett Henson is an 77 y.o. Male who developed slurred speech, left hemiparesis and left arm tingling yesterday at 7 pm. Per patient, his blood pressure was 220 over 100 at the time. He has never had problems with HTN before, per patient. Most of the symptoms resolved in ED. MRI done this am showed new strokes in posterior right frontal and anterior right parietal areas. Neurology called to see patient in the morning.   Past Medical History  Diagnosis Date  . Diabetes mellitus   . Coronary artery disease    No past surgical history on file.  No family history on file.  Social History:  does not have a smoking history on file. He does not have any smokeless tobacco history on file. His alcohol and drug histories not on file.  Allergies:  Allergies  Allergen Reactions  . Ivp Dye (Iodinated Diagnostic Agents) Shortness Of Breath   Medications: I have reviewed the patient's current medications.  ROS: as above  VS: Bp 159/79 - 106/83   Neurological exam: AAO*3. No aphasia.  Was able to tell me months of the year forwards exhibiting good attention span. Followed complex commands. Cranial nerves: EOMI, PERRL. Visual fields were full. Sensation to V1 through V3 areas of the face was intact and symmetric throughout. There was no facial asymmetry. Hearing to finger rub was equal and symmetrical bilaterally. Head rotation was 5/5 bilaterally. There was no dysarthria or palatal deviation. Motor: strength was 5/5 and symmetric throughout except left proximal deltoid, which was 5-/5. Sensory: was intact throughout to light touch, pinprick. Coordination: finger-to-nose were intact and symmetric bilaterally. Reflexes: were 1+ in upper extremities and trace at the knees and absent at the ankles. Plantar response was mute bilaterally. Gait: Romberg test was negative. Had some trouble with toe, heel and  tandem walking. No gross ataxia.   Results for orders placed during the hospital encounter of 04/21/11 (from the past 48 hour(s))  PROTIME-INR     Status: Normal   Collection Time   04/21/11 10:36 PM      Component Value Range Comment   Prothrombin Time 14.1  11.6 - 15.2 (seconds)    INR 1.07  0.00 - 1.49    APTT     Status: Normal   Collection Time   04/21/11 10:36 PM      Component Value Range Comment   aPTT 34  24 - 37 (seconds)   CBC     Status: Abnormal   Collection Time   04/21/11 10:36 PM      Component Value Range Comment   WBC 5.6  4.0 - 10.5 (K/uL)    RBC 3.95 (*) 4.22 - 5.81 (MIL/uL)    Hemoglobin 11.0 (*) 13.0 - 17.0 (g/dL)    HCT 41.3 (*) 24.4 - 52.0 (%)    MCV 82.5  78.0 - 100.0 (fL)    MCH 27.8  26.0 - 34.0 (pg)    MCHC 33.7  30.0 - 36.0 (g/dL)    RDW 01.0  27.2 - 53.6 (%)    Platelets 194  150 - 400 (K/uL)   DIFFERENTIAL     Status: Abnormal   Collection Time   04/21/11 10:36 PM      Component Value Range Comment   Neutrophils Relative 49  43 - 77 (%)    Neutro Abs 2.8  1.7 - 7.7 (K/uL)    Lymphocytes Relative 30  12 - 46 (%)    Lymphs Abs 1.7  0.7 - 4.0 (K/uL)    Monocytes Relative 9  3 - 12 (%)    Monocytes Absolute 0.5  0.1 - 1.0 (K/uL)    Eosinophils Relative 12 (*) 0 - 5 (%)    Eosinophils Absolute 0.7  0.0 - 0.7 (K/uL)    Basophils Relative 1  0 - 1 (%)    Basophils Absolute 0.0  0.0 - 0.1 (K/uL)   COMPREHENSIVE METABOLIC PANEL     Status: Abnormal   Collection Time   04/21/11 10:36 PM      Component Value Range Comment   Sodium 136  135 - 145 (mEq/L)    Potassium 3.8  3.5 - 5.1 (mEq/L)    Chloride 101  96 - 112 (mEq/L)    CO2 26  19 - 32 (mEq/L)    Glucose, Bld 158 (*) 70 - 99 (mg/dL)    BUN 14  6 - 23 (mg/dL)    Creatinine, Ser 1.61  0.50 - 1.35 (mg/dL)    Calcium 8.9  8.4 - 10.5 (mg/dL)    Total Protein 6.4  6.0 - 8.3 (g/dL)    Albumin 3.5  3.5 - 5.2 (g/dL)    AST 18  0 - 37 (U/L)    ALT 13  0 - 53 (U/L)    Alkaline Phosphatase 69  39 - 117  (U/L)    Total Bilirubin 0.2 (*) 0.3 - 1.2 (mg/dL)    GFR calc non Af Amer 75 (*) >90 (mL/min)    GFR calc Af Amer 87 (*) >90 (mL/min)   CK TOTAL AND CKMB     Status: Normal   Collection Time   04/21/11 10:36 PM      Component Value Range Comment   Total CK 81  7 - 232 (U/L)    CK, MB 3.0  0.3 - 4.0 (ng/mL)    Relative Index RELATIVE INDEX IS INVALID  0.0 - 2.5    TROPONIN I     Status: Normal   Collection Time   04/21/11 10:36 PM      Component Value Range Comment   Troponin I <0.30  <0.30 (ng/mL)   URINALYSIS, ROUTINE W REFLEX MICROSCOPIC     Status: Normal   Collection Time   04/21/11 11:41 PM      Component Value Range Comment   Color, Urine YELLOW  YELLOW     APPearance CLEAR  CLEAR     Specific Gravity, Urine 1.009  1.005 - 1.030     pH 7.0  5.0 - 8.0     Glucose, UA NEGATIVE  NEGATIVE (mg/dL)    Hgb urine dipstick NEGATIVE  NEGATIVE     Bilirubin Urine NEGATIVE  NEGATIVE     Ketones, ur NEGATIVE  NEGATIVE (mg/dL)    Protein, ur NEGATIVE  NEGATIVE (mg/dL)    Urobilinogen, UA 1.0  0.0 - 1.0 (mg/dL)    Nitrite NEGATIVE  NEGATIVE     Leukocytes, UA NEGATIVE  NEGATIVE  MICROSCOPIC NOT DONE ON URINES WITH NEGATIVE PROTEIN, BLOOD, LEUKOCYTES, NITRITE, OR GLUCOSE <1000 mg/dL.  LIPID PANEL     Status: Normal   Collection Time   04/22/11  5:05 AM      Component Value Range Comment   Cholesterol 118  0 - 200 (mg/dL)    Triglycerides 91  <096 (mg/dL)    HDL 44  >04 (mg/dL)    Total CHOL/HDL Ratio 2.7  VLDL 18  0 - 40 (mg/dL)    LDL Cholesterol 56  0 - 99 (mg/dL)   GLUCOSE, CAPILLARY     Status: Abnormal   Collection Time   04/22/11  8:38 AM      Component Value Range Comment   Glucose-Capillary 133 (*) 70 - 99 (mg/dL)     Dg Chest 2 View  16/05/958  *RADIOLOGY REPORT*  Clinical Data: Hypertension.  Left upper extremity numbness and paresthesias.  CHEST - 2 VIEW 04/21/2011:  Comparison: Two-view chest x-ray 06/18/2009 [facility].  Findings: Cardiac silhouette  enlarged but stable.  Thoracic aorta mildly tortuous and atherosclerotic consistent with age, unchanged. Hilar and mediastinal contours otherwise unremarkable.  Lungs clear.  Bronchovascular markings normal.  No pleural effusions. Degenerative changes and dish throughout the thoracic spine.  IMPRESSION: Stable cardiomegaly.  No acute cardiopulmonary disease.  Original Report Authenticated By: Arnell Sieving, M.D.   Ct Head Wo Contrast  04/21/2011  *RADIOLOGY REPORT*  Clinical Data: Acute onset of numbness and tingling in the left arm and leg at 07:30 p.m.  The patient was hypertensive.  CT HEAD WITHOUT CONTRAST  Technique:  Contiguous axial images were obtained from the base of the skull through the vertex without contrast.  Comparison: None.  Findings: Mild diffuse cerebral atrophy.  Mild asymmetric ventricular enlargement on the left, likely due to central atrophy. No mass effect or midline shift.  No abnormal extra-axial fluid collections.  Patchy low attenuation changes in the deep white matter probably due to small vessel ischemic change.  The gray- white matter junctions are distinct.  The basal cisterns are not effaced. No evidence of acute intracranial hemorrhage.  No depressed skull fractures. Visualized mastoid air cells and paranasal sinuses are not opacified.  Vascular calcifications in the intracranial arteries.  IMPRESSION: Mild diffuse atrophy and small vessel ischemic change.  No evidence of acute intracranial hemorrhage, mass lesion, or acute infarct. Note that MRI is more sensitive for detection of acute infarct.  Original Report Authenticated By: Marlon Pel, M.D.   Mr Angiogram Neck W Wo Contrast  04/22/2011  *RADIOLOGY REPORT*  Clinical Data:  TIA.  Sudden onset left upper extremity and left lower extremity numbness last night.  The symptoms resolved after proximally 1 hour.  Marked hypertension.  MRI HEAD WITHOUT AND WITH CONTRAST MRA HEAD WITHOUT CONTRAST MRA NECK WITHOUT AND  WITH CONTRAST  Technique:  Multiplanar, multiecho pulse sequences of the brain and surrounding structures were obtained without and with intravenous contrast.  Angiographic images of the Circle of Willis were obtained using MRA technique without intravenous contrast. Angiographic images of the neck were obtained using MRA technique without and with intravenous contrast.  Carotid stenosis measurements (when applicable) are obtained utilizing NASCET criteria, using the distal internal carotid diameter as the denominator.  Contrast: 20mL MULTIHANCE GADOBENATE DIMEGLUMINE 529 MG/ML IV SOLN  Comparison:  CT head without contrast 04/21/2011 at [facility].  MRI HEAD  Findings:  A 9 mm non hemorrhagic acute cortical infarct is present to along the posterior right frontal lobe.  There is an additional punctate area of restricted diffusion within the next gyrus, likely the primary sensory strip.  No hemorrhage or mass lesion is present.  Mild generalized atrophy is present.  Mild periventricular white matter changes are likely within normal limits for age.  There is a remote lacunar white matter infarct of the left corona radiata.  The ventricles are proportionate to the degree of atrophy.  No significant extra-axial  fluid collection is present.  Flow is present in the major intracranial arteries.  The globes and orbits are intact.  A small polyp or mucous retention cyst is present in the right maxillary sinus.  The paranasal sinuses and mastoid air cells are otherwise clear.  The postcontrast images are moderately degraded by patient motion. A benign venous angioma is present within the left cerebellum and extending along the vermis.  The postcontrast images demonstrate no other areas of pathologic enhancement.  There is no enhancement associated with the areas of acute infarction.  IMPRESSION:  1.  Acute non hemorrhagic cortical infarct involving the posterior right frontal lobe. 2.  Second punctate area of acute  non hemorrhagic infarction within the anterior right parietal lobe.  3.  Mild atrophy white matter disease is likely within normal limits for age. 4.  Remote lacunar infarct of the left corona radiata.  MRA HEAD  Findings: The internal carotid arteries are within normal limits bilaterally.  The A1 and M1 segments are normal.  The anterior communicating artery is patent.  There is an early bifurcation of the right A2 segment, a normal variant.  Segmental irregularities are noted within the ACA and MCA branch vessels without significant proximal stenosis or occlusion.  There is no aneurysm.  The left vertebral artery is slightly dominant to the right.  The left PICA origin is visualized and normal.  The right AICA is dominant.  The basilar artery is normal.  Both posterior cerebral arteries originate from the basilar tip.  PCA branch vessels are within normal limits bilaterally.  IMPRESSION:  1.  No significant proximal stenosis, aneurysm, or branch vessel occlusion. 2.  Mild distal small vessel disease, likely within normal limits for age.  MRA NECK  Findings: The time-of-flight images demonstrate no significant flow disturbance at either carotid bifurcation.  Flow is antegrade within the vertebral arteries bilaterally.  A standard three-vessel arch configuration is present.  Narrowing of the proximal right vertebral artery extends for 6 mm, and measures approximately 60% relative to the distal vessel.  The left vertebral artery origin is mildly tortuous but without stenosis.  The vertebral arteries are otherwise within normal limits throughout their course in the neck.  There is some signal loss within the proximal right common carotid artery, felt be artifactual.  The right common carotid artery is otherwise within normal limits.  There is no significant stenosis at the bifurcation.  Minimal atherosclerotic irregularity is evident along the medial wall of the proximal internal carotid artery without significant  stenosis.  The cervical right internal carotid artery is otherwise normal.  The left common carotid artery is normal.  There is a medial plaque along the proximal left internal carotid artery without significant stenosis relative to the distal vessel. The cervical left internal carotid artery is otherwise normal.  IMPRESSION:  1.  60% stenosis of the proximal right vertebral artery. 2.  Mild atherosclerotic irregularity at the carotid bifurcations bilaterally without significant stenosis relative to the distal vessels.  These results were called by telephone on 04/22/2011  at  09:55 a.m. to  Jacelyn Pi, PA, who verbally acknowledged these results.  Original Report Authenticated By: Jamesetta Orleans. MATTERN, M.D.   Mr Laqueta Jean ZO Contrast  04/22/2011  *RADIOLOGY REPORT*  Clinical Data:  TIA.  Sudden onset left upper extremity and left lower extremity numbness last night.  The symptoms resolved after proximally 1 hour.  Marked hypertension.  MRI HEAD WITHOUT AND WITH CONTRAST MRA HEAD WITHOUT CONTRAST MRA NECK  WITHOUT AND WITH CONTRAST  Technique:  Multiplanar, multiecho pulse sequences of the brain and surrounding structures were obtained without and with intravenous contrast.  Angiographic images of the Circle of Willis were obtained using MRA technique without intravenous contrast. Angiographic images of the neck were obtained using MRA technique without and with intravenous contrast.  Carotid stenosis measurements (when applicable) are obtained utilizing NASCET criteria, using the distal internal carotid diameter as the denominator.  Contrast: 20mL MULTIHANCE GADOBENATE DIMEGLUMINE 529 MG/ML IV SOLN  Comparison:  CT head without contrast 04/21/2011 at [facility].  MRI HEAD  Findings:  A 9 mm non hemorrhagic acute cortical infarct is present to along the posterior right frontal lobe.  There is an additional punctate area of restricted diffusion within the next gyrus, likely the primary sensory strip.   No hemorrhage or mass lesion is present.  Mild generalized atrophy is present.  Mild periventricular white matter changes are likely within normal limits for age.  There is a remote lacunar white matter infarct of the left corona radiata.  The ventricles are proportionate to the degree of atrophy.  No significant extra-axial fluid collection is present.  Flow is present in the major intracranial arteries.  The globes and orbits are intact.  A small polyp or mucous retention cyst is present in the right maxillary sinus.  The paranasal sinuses and mastoid air cells are otherwise clear.  The postcontrast images are moderately degraded by patient motion. A benign venous angioma is present within the left cerebellum and extending along the vermis.  The postcontrast images demonstrate no other areas of pathologic enhancement.  There is no enhancement associated with the areas of acute infarction.  IMPRESSION:  1.  Acute non hemorrhagic cortical infarct involving the posterior right frontal lobe. 2.  Second punctate area of acute non hemorrhagic infarction within the anterior right parietal lobe.  3.  Mild atrophy white matter disease is likely within normal limits for age. 4.  Remote lacunar infarct of the left corona radiata.  MRA HEAD  Findings: The internal carotid arteries are within normal limits bilaterally.  The A1 and M1 segments are normal.  The anterior communicating artery is patent.  There is an early bifurcation of the right A2 segment, a normal variant.  Segmental irregularities are noted within the ACA and MCA branch vessels without significant proximal stenosis or occlusion.  There is no aneurysm.  The left vertebral artery is slightly dominant to the right.  The left PICA origin is visualized and normal.  The right AICA is dominant.  The basilar artery is normal.  Both posterior cerebral arteries originate from the basilar tip.  PCA branch vessels are within normal limits bilaterally.  IMPRESSION:  1.   No significant proximal stenosis, aneurysm, or branch vessel occlusion. 2.  Mild distal small vessel disease, likely within normal limits for age.  MRA NECK  Findings: The time-of-flight images demonstrate no significant flow disturbance at either carotid bifurcation.  Flow is antegrade within the vertebral arteries bilaterally.  A standard three-vessel arch configuration is present.  Narrowing of the proximal right vertebral artery extends for 6 mm, and measures approximately 60% relative to the distal vessel.  The left vertebral artery origin is mildly tortuous but without stenosis.  The vertebral arteries are otherwise within normal limits throughout their course in the neck.  There is some signal loss within the proximal right common carotid artery, felt be artifactual.  The right common carotid artery is otherwise within normal limits.  There is no significant stenosis at the bifurcation.  Minimal atherosclerotic irregularity is evident along the medial wall of the proximal internal carotid artery without significant stenosis.  The cervical right internal carotid artery is otherwise normal.  The left common carotid artery is normal.  There is a medial plaque along the proximal left internal carotid artery without significant stenosis relative to the distal vessel. The cervical left internal carotid artery is otherwise normal.  IMPRESSION:  1.  60% stenosis of the proximal right vertebral artery. 2.  Mild atherosclerotic irregularity at the carotid bifurcations bilaterally without significant stenosis relative to the distal vessels.  These results were called by telephone on 04/22/2011  at  09:55 a.m. to  Jacelyn Pi, PA, who verbally acknowledged these results.  Original Report Authenticated By: Jamesetta Orleans. MATTERN, M.D.   Mr Maxine Glenn Head/brain Wo Cm  04/22/2011  *RADIOLOGY REPORT*  Clinical Data:  TIA.  Sudden onset left upper extremity and left lower extremity numbness last night.  The symptoms  resolved after proximally 1 hour.  Marked hypertension.  MRI HEAD WITHOUT AND WITH CONTRAST MRA HEAD WITHOUT CONTRAST MRA NECK WITHOUT AND WITH CONTRAST  Technique:  Multiplanar, multiecho pulse sequences of the brain and surrounding structures were obtained without and with intravenous contrast.  Angiographic images of the Circle of Willis were obtained using MRA technique without intravenous contrast. Angiographic images of the neck were obtained using MRA technique without and with intravenous contrast.  Carotid stenosis measurements (when applicable) are obtained utilizing NASCET criteria, using the distal internal carotid diameter as the denominator.  Contrast: 20mL MULTIHANCE GADOBENATE DIMEGLUMINE 529 MG/ML IV SOLN  Comparison:  CT head without contrast 04/21/2011 at [facility].  MRI HEAD  Findings:  A 9 mm non hemorrhagic acute cortical infarct is present to along the posterior right frontal lobe.  There is an additional punctate area of restricted diffusion within the next gyrus, likely the primary sensory strip.  No hemorrhage or mass lesion is present.  Mild generalized atrophy is present.  Mild periventricular white matter changes are likely within normal limits for age.  There is a remote lacunar white matter infarct of the left corona radiata.  The ventricles are proportionate to the degree of atrophy.  No significant extra-axial fluid collection is present.  Flow is present in the major intracranial arteries.  The globes and orbits are intact.  A small polyp or mucous retention cyst is present in the right maxillary sinus.  The paranasal sinuses and mastoid air cells are otherwise clear.  The postcontrast images are moderately degraded by patient motion. A benign venous angioma is present within the left cerebellum and extending along the vermis.  The postcontrast images demonstrate no other areas of pathologic enhancement.  There is no enhancement associated with the areas of acute  infarction.  IMPRESSION:  1.  Acute non hemorrhagic cortical infarct involving the posterior right frontal lobe. 2.  Second punctate area of acute non hemorrhagic infarction within the anterior right parietal lobe.  3.  Mild atrophy white matter disease is likely within normal limits for age. 4.  Remote lacunar infarct of the left corona radiata.  MRA HEAD  Findings: The internal carotid arteries are within normal limits bilaterally.  The A1 and M1 segments are normal.  The anterior communicating artery is patent.  There is an early bifurcation of the right A2 segment, a normal variant.  Segmental irregularities are noted within the ACA and MCA branch vessels without significant proximal stenosis or  occlusion.  There is no aneurysm.  The left vertebral artery is slightly dominant to the right.  The left PICA origin is visualized and normal.  The right AICA is dominant.  The basilar artery is normal.  Both posterior cerebral arteries originate from the basilar tip.  PCA branch vessels are within normal limits bilaterally.  IMPRESSION:  1.  No significant proximal stenosis, aneurysm, or branch vessel occlusion. 2.  Mild distal small vessel disease, likely within normal limits for age.  MRA NECK  Findings: The time-of-flight images demonstrate no significant flow disturbance at either carotid bifurcation.  Flow is antegrade within the vertebral arteries bilaterally.  A standard three-vessel arch configuration is present.  Narrowing of the proximal right vertebral artery extends for 6 mm, and measures approximately 60% relative to the distal vessel.  The left vertebral artery origin is mildly tortuous but without stenosis.  The vertebral arteries are otherwise within normal limits throughout their course in the neck.  There is some signal loss within the proximal right common carotid artery, felt be artifactual.  The right common carotid artery is otherwise within normal limits.  There is no significant stenosis at the  bifurcation.  Minimal atherosclerotic irregularity is evident along the medial wall of the proximal internal carotid artery without significant stenosis.  The cervical right internal carotid artery is otherwise normal.  The left common carotid artery is normal.  There is a medial plaque along the proximal left internal carotid artery without significant stenosis relative to the distal vessel. The cervical left internal carotid artery is otherwise normal.  IMPRESSION:  1.  60% stenosis of the proximal right vertebral artery. 2.  Mild atherosclerotic irregularity at the carotid bifurcations bilaterally without significant stenosis relative to the distal vessels.  These results were called by telephone on 04/22/2011  at  09:55 a.m. to  Jacelyn Pi, PA, who verbally acknowledged these results.  Original Report Authenticated By: Jamesetta Orleans. MATTERN, M.D.   Assessment/Plan: 77 years old man with new acute non hemorrhagic cortical infarct involving the posterior right frontal lobe and second punctate area of acute non hemorrhagic infarction within the anterior right parietal lobe that presented as slurred speech and left arm numbness and weakness with some leg weakness as well.  1) Telemetry, Echo 2) Switch to Plavix from ASA 3) HgA1C - pending 4) Second set of troponins - address chest pressure 5) NPO until nursing Swallow Eval 6) PT/OT 7) MAP b/w 120 to 130 8) Neurochecks q4h 9) IV fluids 10) Will follow  Carmell Austria 04/22/2011, 10:58 AM

## 2011-04-22 NOTE — ED Notes (Signed)
Neurologist here to see pt but is consulting only on pt and requests that medicine be paged to admit pt.

## 2011-04-23 ENCOUNTER — Inpatient Hospital Stay (HOSPITAL_COMMUNITY): Payer: Medicare Other

## 2011-04-23 DIAGNOSIS — I2510 Atherosclerotic heart disease of native coronary artery without angina pectoris: Secondary | ICD-10-CM | POA: Diagnosis present

## 2011-04-23 DIAGNOSIS — E119 Type 2 diabetes mellitus without complications: Secondary | ICD-10-CM | POA: Diagnosis present

## 2011-04-23 DIAGNOSIS — I359 Nonrheumatic aortic valve disorder, unspecified: Secondary | ICD-10-CM

## 2011-04-23 LAB — GLUCOSE, CAPILLARY
Glucose-Capillary: 221 mg/dL — ABNORMAL HIGH (ref 70–99)
Glucose-Capillary: 179 mg/dL — ABNORMAL HIGH (ref 70–99)
Glucose-Capillary: 160 mg/dL — ABNORMAL HIGH (ref 70–99)

## 2011-04-23 LAB — LIPID PANEL
HDL: 59 mg/dL (ref >39)
LDL Cholesterol: 71 mg/dL (ref 0–99)
Total CHOL/HDL Ratio: 2.5 RATIO
VLDL: 15 mg/dL (ref 0–40)

## 2011-04-23 LAB — CARDIAC PANEL(CRET KIN+CKTOT+MB+TROPI)
Relative Index: INVALID (ref 0.0–2.5)
Total CK: 67 U/L (ref 7–232)

## 2011-04-23 LAB — URINE CULTURE
Colony Count: 40000
Culture  Setup Time: 201212080038

## 2011-04-23 LAB — HEMOGLOBIN A1C
Hgb A1c MFr Bld: 6.6 % — ABNORMAL HIGH (ref <5.7)
Mean Plasma Glucose: 143 mg/dL — ABNORMAL HIGH (ref <117)

## 2011-04-23 MED ORDER — METFORMIN HCL 500 MG PO TABS
1000.0000 mg | ORAL_TABLET | Freq: Two times a day (BID) | ORAL | Status: DC
  Administered 2011-04-24: 1000 mg via ORAL
  Filled 2011-04-23 (×4): qty 2

## 2011-04-23 MED ORDER — INSULIN ASPART 100 UNIT/ML ~~LOC~~ SOLN
0.0000 [IU] | Freq: Three times a day (TID) | SUBCUTANEOUS | Status: DC
  Filled 2011-04-23: qty 3

## 2011-04-23 NOTE — Progress Notes (Signed)
Subjective: Patient is alert and cooperative. No significant complaints at this time. The patient indicates that he believes that his symptoms have completely resolved. Patient was up with a therapist walking in the halls, doing well with this.  Objective: Vital signs in last 24 hours: Temp:  [97.7 F (36.5 C)-98.7 F (37.1 C)] 98.1 F (36.7 C) (12/09 0928) Pulse Rate:  [60-73] 73  (12/09 0928) Resp:  [18-22] 22  (12/09 0928) BP: (150-190)/(56-83) 152/70 mmHg (12/09 0928) SpO2:  [95 %-99 %] 98 % (12/09 0928) Weight change:  Last BM Date: 04/19/11  Intake/Output from previous day: 12/08 0701 - 12/09 0700 In: -  Out: 1550 [Urine:1550] Intake/Output this shift:    @ROS @  The patient reports occasional headaches, this has a long duration, over several years.  The patient denies any visual field changes, and denies double vision.  The patient denies shortness of breath, chest pain.  The patient denies abdominal pain, nausea or vomiting.   The patient reports no numbness or weakness of the face, arms, or legs.   Physical Examination:  Mental Status Exam: The patient is alert and cooperative at the time of the examination. The patient is oriented x3.  Motor Exam: The patient moves all 4 extremities, and has good strength bilaterally.  Cerebellar Exam: The patient has good finger-nose-finger and heel-to-shin bilaterally. Gait was not tested.  Deep tendon reflexes: Deep tendon reflexes are symmetric throughout. Toes are downgoing bilaterally.  Cranial Nerve Exam: Facial symmetry is present. Extraocular movements are full. Visual fields are full. Speech is well enunciated, no aphasia or dysarthria is noted.    Lab Results:  [facility] 04/22/11 1738 04/21/11 2236  WBC 7.4 5.6  HGB 12.9* 11.0*  HCT 38.1* 32.6*  PLT 216 194   BMET  Basename 04/22/11 1738 04/21/11 2236  NA -- 136  K -- 3.8  CL -- 101  CO2 -- 26  GLUCOSE -- 158*  BUN -- 14  CREATININE 0.94 0.94    CALCIUM -- 8.9    Studies/Results: Dg Chest 2 View  04/23/2011  *RADIOLOGY REPORT*  Clinical Data: Stroke  CHEST - 2 VIEW  Comparison: 04/21/2011  Findings: Hyperaeration.  Ovoid opacity in the right lower lung zone has developed.  Linear atelectasis at the left base.  No pneumothorax.  No pleural effusion.  Stable T12 compression deformity.  Osteopenia.  IMPRESSION: New right lower lung zone ovoid opacity.  Pulmonary nodule is not excluded.  This may represent focal airspace disease.  Attention on short-term follow-up.  Original Report Authenticated By: Donavan Burnet, M.D.   Dg Chest 2 View  04/21/2011  *RADIOLOGY REPORT*  Clinical Data: Hypertension.  Left upper extremity numbness and paresthesias.  CHEST - 2 VIEW 04/21/2011:  Comparison: Two-view chest x-ray 06/18/2009 [facility].  Findings: Cardiac silhouette enlarged but stable.  Thoracic aorta mildly tortuous and atherosclerotic consistent with age, unchanged. Hilar and mediastinal contours otherwise unremarkable.  Lungs clear.  Bronchovascular markings normal.  No pleural effusions. Degenerative changes and dish throughout the thoracic spine.  IMPRESSION: Stable cardiomegaly.  No acute cardiopulmonary disease.  Original Report Authenticated By: Arnell Sieving, M.D.   Ct Head Wo Contrast  04/21/2011  *RADIOLOGY REPORT*  Clinical Data: Acute onset of numbness and tingling in the left arm and leg at 07:30 p.m.  The patient was hypertensive.  CT HEAD WITHOUT CONTRAST  Technique:  Contiguous axial images were obtained from the base of the skull through the vertex without contrast.  Comparison: None.  Findings:  Mild diffuse cerebral atrophy.  Mild asymmetric ventricular enlargement on the left, likely due to central atrophy. No mass effect or midline shift.  No abnormal extra-axial fluid collections.  Patchy low attenuation changes in the deep white matter probably due to small vessel ischemic change.  The gray- white matter junctions are  distinct.  The basal cisterns are not effaced. No evidence of acute intracranial hemorrhage.  No depressed skull fractures. Visualized mastoid air cells and paranasal sinuses are not opacified.  Vascular calcifications in the intracranial arteries.  IMPRESSION: Mild diffuse atrophy and small vessel ischemic change.  No evidence of acute intracranial hemorrhage, mass lesion, or acute infarct. Note that MRI is more sensitive for detection of acute infarct.  Original Report Authenticated By: Marlon Pel, M.D.   Mr Angiogram Neck W Wo Contrast  04/22/2011  *RADIOLOGY REPORT*  Clinical Data:  TIA.  Sudden onset left upper extremity and left lower extremity numbness last night.  The symptoms resolved after proximally 1 hour.  Marked hypertension.  MRI HEAD WITHOUT AND WITH CONTRAST MRA HEAD WITHOUT CONTRAST MRA NECK WITHOUT AND WITH CONTRAST  Technique:  Multiplanar, multiecho pulse sequences of the brain and surrounding structures were obtained without and with intravenous contrast.  Angiographic images of the Circle of Adewale Pucillo were obtained using MRA technique without intravenous contrast. Angiographic images of the neck were obtained using MRA technique without and with intravenous contrast.  Carotid stenosis measurements (when applicable) are obtained utilizing NASCET criteria, using the distal internal carotid diameter as the denominator.  Contrast: 20mL MULTIHANCE GADOBENATE DIMEGLUMINE 529 MG/ML IV SOLN  Comparison:  CT head without contrast 04/21/2011 at [facility].  MRI HEAD  Findings:  A 9 mm non hemorrhagic acute cortical infarct is present to along the posterior right frontal lobe.  There is an additional punctate area of restricted diffusion within the next gyrus, likely the primary sensory strip.  No hemorrhage or mass lesion is present.  Mild generalized atrophy is present.  Mild periventricular white matter changes are likely within normal limits for age.  There is a remote lacunar white  matter infarct of the left corona radiata.  The ventricles are proportionate to the degree of atrophy.  No significant extra-axial fluid collection is present.  Flow is present in the major intracranial arteries.  The globes and orbits are intact.  A small polyp or mucous retention cyst is present in the right maxillary sinus.  The paranasal sinuses and mastoid air cells are otherwise clear.  The postcontrast images are moderately degraded by patient motion. A benign venous angioma is present within the left cerebellum and extending along the vermis.  The postcontrast images demonstrate no other areas of pathologic enhancement.  There is no enhancement associated with the areas of acute infarction.  IMPRESSION:  1.  Acute non hemorrhagic cortical infarct involving the posterior right frontal lobe. 2.  Second punctate area of acute non hemorrhagic infarction within the anterior right parietal lobe.  3.  Mild atrophy white matter disease is likely within normal limits for age. 4.  Remote lacunar infarct of the left corona radiata.  MRA HEAD  Findings: The internal carotid arteries are within normal limits bilaterally.  The A1 and M1 segments are normal.  The anterior communicating artery is patent.  There is an early bifurcation of the right A2 segment, a normal variant.  Segmental irregularities are noted within the ACA and MCA branch vessels without significant proximal stenosis or occlusion.  There is no aneurysm.  The left vertebral artery is slightly dominant to the right.  The left PICA origin is visualized and normal.  The right AICA is dominant.  The basilar artery is normal.  Both posterior cerebral arteries originate from the basilar tip.  PCA branch vessels are within normal limits bilaterally.  IMPRESSION:  1.  No significant proximal stenosis, aneurysm, or branch vessel occlusion. 2.  Mild distal small vessel disease, likely within normal limits for age.  MRA NECK  Findings: The time-of-flight images  demonstrate no significant flow disturbance at either carotid bifurcation.  Flow is antegrade within the vertebral arteries bilaterally.  A standard three-vessel arch configuration is present.  Narrowing of the proximal right vertebral artery extends for 6 mm, and measures approximately 60% relative to the distal vessel.  The left vertebral artery origin is mildly tortuous but without stenosis.  The vertebral arteries are otherwise within normal limits throughout their course in the neck.  There is some signal loss within the proximal right common carotid artery, felt be artifactual.  The right common carotid artery is otherwise within normal limits.  There is no significant stenosis at the bifurcation.  Minimal atherosclerotic irregularity is evident along the medial wall of the proximal internal carotid artery without significant stenosis.  The cervical right internal carotid artery is otherwise normal.  The left common carotid artery is normal.  There is a medial plaque along the proximal left internal carotid artery without significant stenosis relative to the distal vessel. The cervical left internal carotid artery is otherwise normal.  IMPRESSION:  1.  60% stenosis of the proximal right vertebral artery. 2.  Mild atherosclerotic irregularity at the carotid bifurcations bilaterally without significant stenosis relative to the distal vessels.  These results were called by telephone on 04/22/2011  at  09:55 a.m. to  Jacelyn Pi, PA, who verbally acknowledged these results.  Original Report Authenticated By: Jamesetta Orleans. MATTERN, M.D.   Mr Laqueta Jean ZO Contrast  04/22/2011  *RADIOLOGY REPORT*  Clinical Data:  TIA.  Sudden onset left upper extremity and left lower extremity numbness last night.  The symptoms resolved after proximally 1 hour.  Marked hypertension.  MRI HEAD WITHOUT AND WITH CONTRAST MRA HEAD WITHOUT CONTRAST MRA NECK WITHOUT AND WITH CONTRAST  Technique:  Multiplanar, multiecho pulse  sequences of the brain and surrounding structures were obtained without and with intravenous contrast.  Angiographic images of the Circle of Fabiano Ginley were obtained using MRA technique without intravenous contrast. Angiographic images of the neck were obtained using MRA technique without and with intravenous contrast.  Carotid stenosis measurements (when applicable) are obtained utilizing NASCET criteria, using the distal internal carotid diameter as the denominator.  Contrast: 20mL MULTIHANCE GADOBENATE DIMEGLUMINE 529 MG/ML IV SOLN  Comparison:  CT head without contrast 04/21/2011 at [facility].  MRI HEAD  Findings:  A 9 mm non hemorrhagic acute cortical infarct is present to along the posterior right frontal lobe.  There is an additional punctate area of restricted diffusion within the next gyrus, likely the primary sensory strip.  No hemorrhage or mass lesion is present.  Mild generalized atrophy is present.  Mild periventricular white matter changes are likely within normal limits for age.  There is a remote lacunar white matter infarct of the left corona radiata.  The ventricles are proportionate to the degree of atrophy.  No significant extra-axial fluid collection is present.  Flow is present in the major intracranial arteries.  The globes and orbits are intact.  A small polyp  or mucous retention cyst is present in the right maxillary sinus.  The paranasal sinuses and mastoid air cells are otherwise clear.  The postcontrast images are moderately degraded by patient motion. A benign venous angioma is present within the left cerebellum and extending along the vermis.  The postcontrast images demonstrate no other areas of pathologic enhancement.  There is no enhancement associated with the areas of acute infarction.  IMPRESSION:  1.  Acute non hemorrhagic cortical infarct involving the posterior right frontal lobe. 2.  Second punctate area of acute non hemorrhagic infarction within the anterior right  parietal lobe.  3.  Mild atrophy white matter disease is likely within normal limits for age. 4.  Remote lacunar infarct of the left corona radiata.  MRA HEAD  Findings: The internal carotid arteries are within normal limits bilaterally.  The A1 and M1 segments are normal.  The anterior communicating artery is patent.  There is an early bifurcation of the right A2 segment, a normal variant.  Segmental irregularities are noted within the ACA and MCA branch vessels without significant proximal stenosis or occlusion.  There is no aneurysm.  The left vertebral artery is slightly dominant to the right.  The left PICA origin is visualized and normal.  The right AICA is dominant.  The basilar artery is normal.  Both posterior cerebral arteries originate from the basilar tip.  PCA branch vessels are within normal limits bilaterally.  IMPRESSION:  1.  No significant proximal stenosis, aneurysm, or branch vessel occlusion. 2.  Mild distal small vessel disease, likely within normal limits for age.  MRA NECK  Findings: The time-of-flight images demonstrate no significant flow disturbance at either carotid bifurcation.  Flow is antegrade within the vertebral arteries bilaterally.  A standard three-vessel arch configuration is present.  Narrowing of the proximal right vertebral artery extends for 6 mm, and measures approximately 60% relative to the distal vessel.  The left vertebral artery origin is mildly tortuous but without stenosis.  The vertebral arteries are otherwise within normal limits throughout their course in the neck.  There is some signal loss within the proximal right common carotid artery, felt be artifactual.  The right common carotid artery is otherwise within normal limits.  There is no significant stenosis at the bifurcation.  Minimal atherosclerotic irregularity is evident along the medial wall of the proximal internal carotid artery without significant stenosis.  The cervical right internal carotid artery is  otherwise normal.  The left common carotid artery is normal.  There is a medial plaque along the proximal left internal carotid artery without significant stenosis relative to the distal vessel. The cervical left internal carotid artery is otherwise normal.  IMPRESSION:  1.  60% stenosis of the proximal right vertebral artery. 2.  Mild atherosclerotic irregularity at the carotid bifurcations bilaterally without significant stenosis relative to the distal vessels.  These results were called by telephone on 04/22/2011  at  09:55 a.m. to  Jacelyn Pi, PA, who verbally acknowledged these results.  Original Report Authenticated By: Jamesetta Orleans. MATTERN, M.D.   Mr Maxine Glenn Head/brain Wo Cm  04/22/2011  *RADIOLOGY REPORT*  Clinical Data:  TIA.  Sudden onset left upper extremity and left lower extremity numbness last night.  The symptoms resolved after proximally 1 hour.  Marked hypertension.  MRI HEAD WITHOUT AND WITH CONTRAST MRA HEAD WITHOUT CONTRAST MRA NECK WITHOUT AND WITH CONTRAST  Technique:  Multiplanar, multiecho pulse sequences of the brain and surrounding structures were obtained without and with intravenous contrast.  Angiographic images of the Circle of Anthonee Gelin were obtained using MRA technique without intravenous contrast. Angiographic images of the neck were obtained using MRA technique without and with intravenous contrast.  Carotid stenosis measurements (when applicable) are obtained utilizing NASCET criteria, using the distal internal carotid diameter as the denominator.  Contrast: 20mL MULTIHANCE GADOBENATE DIMEGLUMINE 529 MG/ML IV SOLN  Comparison:  CT head without contrast 04/21/2011 at [facility].  MRI HEAD  Findings:  A 9 mm non hemorrhagic acute cortical infarct is present to along the posterior right frontal lobe.  There is an additional punctate area of restricted diffusion within the next gyrus, likely the primary sensory strip.  No hemorrhage or mass lesion is present.  Mild  generalized atrophy is present.  Mild periventricular white matter changes are likely within normal limits for age.  There is a remote lacunar white matter infarct of the left corona radiata.  The ventricles are proportionate to the degree of atrophy.  No significant extra-axial fluid collection is present.  Flow is present in the major intracranial arteries.  The globes and orbits are intact.  A small polyp or mucous retention cyst is present in the right maxillary sinus.  The paranasal sinuses and mastoid air cells are otherwise clear.  The postcontrast images are moderately degraded by patient motion. A benign venous angioma is present within the left cerebellum and extending along the vermis.  The postcontrast images demonstrate no other areas of pathologic enhancement.  There is no enhancement associated with the areas of acute infarction.  IMPRESSION:  1.  Acute non hemorrhagic cortical infarct involving the posterior right frontal lobe. 2.  Second punctate area of acute non hemorrhagic infarction within the anterior right parietal lobe.  3.  Mild atrophy white matter disease is likely within normal limits for age. 4.  Remote lacunar infarct of the left corona radiata.  MRA HEAD  Findings: The internal carotid arteries are within normal limits bilaterally.  The A1 and M1 segments are normal.  The anterior communicating artery is patent.  There is an early bifurcation of the right A2 segment, a normal variant.  Segmental irregularities are noted within the ACA and MCA branch vessels without significant proximal stenosis or occlusion.  There is no aneurysm.  The left vertebral artery is slightly dominant to the right.  The left PICA origin is visualized and normal.  The right AICA is dominant.  The basilar artery is normal.  Both posterior cerebral arteries originate from the basilar tip.  PCA branch vessels are within normal limits bilaterally.  IMPRESSION:  1.  No significant proximal stenosis, aneurysm, or  branch vessel occlusion. 2.  Mild distal small vessel disease, likely within normal limits for age.  MRA NECK  Findings: The time-of-flight images demonstrate no significant flow disturbance at either carotid bifurcation.  Flow is antegrade within the vertebral arteries bilaterally.  A standard three-vessel arch configuration is present.  Narrowing of the proximal right vertebral artery extends for 6 mm, and measures approximately 60% relative to the distal vessel.  The left vertebral artery origin is mildly tortuous but without stenosis.  The vertebral arteries are otherwise within normal limits throughout their course in the neck.  There is some signal loss within the proximal right common carotid artery, felt be artifactual.  The right common carotid artery is otherwise within normal limits.  There is no significant stenosis at the bifurcation.  Minimal atherosclerotic irregularity is evident along the medial wall of the proximal internal carotid artery  without significant stenosis.  The cervical right internal carotid artery is otherwise normal.  The left common carotid artery is normal.  There is a medial plaque along the proximal left internal carotid artery without significant stenosis relative to the distal vessel. The cervical left internal carotid artery is otherwise normal.  IMPRESSION:  1.  60% stenosis of the proximal right vertebral artery. 2.  Mild atherosclerotic irregularity at the carotid bifurcations bilaterally without significant stenosis relative to the distal vessels.  These results were called by telephone on 04/22/2011  at  09:55 a.m. to  Jacelyn Pi, PA, who verbally acknowledged these results.  Original Report Authenticated By: Jamesetta Orleans. MATTERN, M.D.    Medications:  Scheduled:   . acetaminophen  650 mg Oral Once  . clopidogrel  75 mg Oral Q breakfast  . diphenhydrAMINE  25 mg Oral Once  . fish oil-omega-3 fatty acids  1 g Oral Q0700  . heparin  5,000 Units  Subcutaneous Q8H  . isosorbide mononitrate  60 mg Oral Q0700  . metFORMIN  1,000 mg Oral BID WC  . multivitamins ther. w/minerals  1 tablet Oral Q0700  . ranolazine  500 mg Oral QHS  . rosuvastatin  10 mg Oral QHS  . sodium chloride  3 mL Intravenous Q12H  . DISCONTD: aspirin  325 mg Oral Daily  . DISCONTD: insulin aspart  0-8 Units Subcutaneous TID WC  . DISCONTD: insulin aspart  0-9 Units Subcutaneous TID WC  . DISCONTD: methylPREDNISolone (SOLU-MEDROL) injection  60 mg Intravenous 1 day or 1 dose   Continuous:   . DISCONTD: sodium chloride    . DISCONTD: sodium chloride 100 mL/hr at 04/22/11 1225   ZOX:WRUEAV chloride, acetaminophen, nitroGLYCERIN, senna-docusate, sodium chloride  Carotid duplex completed. Right 40% to 59 % ICA stenosis lower end of scale. Left 40% to 59% ICA stenosis upper end of scale. Vertebral arteries are patent and flow is antegrade..  Assessment/Plan:  1. Small right frontal and parietal strokes  2. History of diabetes  3. Coronary artery disease  4. Hypertension  The patient is doing quite well at this time. The patient was on low-dose aspirin prior to admission. The patient is now on Plavix. The patient indicates that he has no residual deficits. The 2-D echocardiogram has been done, and the results are pending. The patient remains on a heart monitor. Patient is eating and drinking well. Patient could be discharged potentially tomorrow.    LOS: 2 days   Arnika Larzelere KEITH 04/23/2011, 12:01 PM

## 2011-04-23 NOTE — Progress Notes (Signed)
  Echocardiogram 2D Echocardiogram has been performed.  Garrett Henson Garrett Henson 04/23/2011, 10:40 AM

## 2011-04-23 NOTE — Progress Notes (Signed)
*  PRELIMINARY RESULTS* Carotid duplex completed. Right 40% to 59 % ICA stenosis lower end of scale. Left 40% to 59% ICA stenosis upper end of scale. Vertebral arteries are patent and flow is antegrade.Milta Deiters, [place] D 04/23/2011, 11:55 AM

## 2011-04-23 NOTE — Progress Notes (Addendum)
Garrett Henson OZH:[id number],GEX:[id number] is a 77 y.o. male,  Outpatient Primary MD for the patient is No primary provider on file.  Chief Complaint  Patient presents with  . Tingling        Subjective:   Garrett Henson today has, No headache, No chest pain, No abdominal pain - No Nausea, No new weakness tingling or numbness, No Cough - SOB. Left sided weakness almost resolved.  Objective:   Filed Vitals:   04/22/11 2300 04/23/11 0200 04/23/11 0600 04/23/11 0928  BP: 165/83 172/76 182/75 152/70  Pulse: 71 72 68 73  Temp: 98.7 F (37.1 C) 98.4 F (36.9 C) 98.6 F (37 C) 98.1 F (36.7 C)  TempSrc: Oral Oral Oral Oral  Resp: 20 18 22 22   SpO2: 95% 97% 98% 98%    Wt Readings from Last 3 Encounters:  No data found for Wt     Intake/Output Summary (Last 24 hours) at 04/23/11 1014 Last data filed at 04/23/11 0700  Gross per 24 hour  Intake      0 ml  Output   1550 ml  Net  -1550 ml    Exam Awake Alert, Oriented *3, No new F.N deficits, Normal affect [place].AT,PERRAL Supple Neck,No JVD, No cervical lymphadenopathy appriciated.  Symmetrical Chest wall movement, Good air movement bilaterally, CTAB RRR,No Gallops,Rubs or new Murmurs, No Parasternal Heave +ve B.Sounds, Abd Soft, Non tender, No organomegaly appriciated, No rebound -guarding or rigidity. No Cyanosis, Clubbing or edema, No new Rash or bruise     Data Review  CBC  Lab 04/22/11 1738 04/21/11 2236  WBC 7.4 5.6  HGB 12.9* 11.0*  HCT 38.1* 32.6*  PLT 216 194  MCV 82.5 82.5  MCH 27.9 27.8  MCHC 33.9 33.7  RDW 13.4 13.5  LYMPHSABS -- 1.7  MONOABS -- 0.5  EOSABS -- 0.7  BASOSABS -- 0.0  BANDABS -- --    Chemistries   Lab 04/22/11 1738 04/21/11 2236  NA -- 136  K -- 3.8  CL -- 101  CO2 -- 26  GLUCOSE -- 158*  BUN -- 14  CREATININE 0.94 0.94  CALCIUM -- 8.9  MG -- --  AST -- 18  ALT -- 13  ALKPHOS -- 69  BILITOT -- 0.2*    ------------------------------------------------------------------------------------------------------------------ CrCl is unknown because there is no height on file for the current visit. ------------------------------------------------------------------------------------------------------------------  [facility] 04/21/11 2236  HGBA1C 6.7*   ------------------------------------------------------------------------------------------------------------------  Basename 04/23/11 0355 04/22/11 0505  CHOL 145 118  HDL 59 44  LDLCALC 71 56  TRIG 77 91  CHOLHDL 2.5 2.7  LDLDIRECT -- --   ------------------------------------------------------------------------------------------------------------------ No results found for this basename: TSH,T4TOTAL,FREET3,T3FREE,THYROIDAB in the last 72 hours ------------------------------------------------------------------------------------------------------------------ No results found for this basename: VITAMINB12:2,FOLATE:2,FERRITIN:2,TIBC:2,IRON:2,RETICCTPCT:2 in the last 72 hours  Coagulation profile  Lab 04/21/11 2236  INR 1.07  PROTIME --    No results found for this basename: DDIMER:2 in the last 72 hours  Cardiac Enzymes  Lab 04/23/11 0355 04/22/11 2049 04/22/11 1107 04/21/11 2236  CKMB 2.6 2.6 -- 3.0  TROPONINI <0.30 <0.30 <0.30 --  MYOGLOBIN -- -- -- --   ------------------------------------------------------------------------------------------------------------------ No results found for this basename: POCBNP:3 in the last 168 hours  Micro Results No results found for this or any previous visit (from the past 240 hour(s)).  Radiology Reports Dg Chest 2 View  04/23/2011  *RADIOLOGY REPORT*  Clinical Data: Stroke  CHEST - 2 VIEW  Comparison: 04/21/2011  Findings: Hyperaeration.  Ovoid opacity in the right lower lung zone  has developed.  Linear atelectasis at the left base.  No pneumothorax.  No pleural effusion.  Stable T12  compression deformity.  Osteopenia.    IMPRESSION: New right lower lung zone ovoid opacity.  Pulmonary nodule is not excluded.  This may represent focal airspace disease.  Attention on short-term follow-up.  Original Report Authenticated By: Donavan Burnet, M.D.    Ct Head Wo Contrast  04/21/2011  *RADIOLOGY REPORT*  Clinical Data: Acute onset of numbness and tingling in the left arm and leg at 07:30 p.m.  The patient was hypertensive.  CT HEAD WITHOUT CONTRAST  Technique:  Contiguous axial images were obtained from the base of the skull through the vertex without contrast.  Comparison: None.  Findings: Mild diffuse cerebral atrophy.  Mild asymmetric ventricular enlargement on the left, likely due to central atrophy. No mass effect or midline shift.  No abnormal extra-axial fluid collections.  Patchy low attenuation changes in the deep white matter probably due to small vessel ischemic change.  The gray- white matter junctions are distinct.  The basal cisterns are not effaced. No evidence of acute intracranial hemorrhage.  No depressed skull fractures. Visualized mastoid air cells and paranasal sinuses are not opacified.  Vascular calcifications in the intracranial arteries.  IMPRESSION: Mild diffuse atrophy and small vessel ischemic change.  No evidence of acute intracranial hemorrhage, mass lesion, or acute infarct. Note that MRI is more sensitive for detection of acute infarct.  Original Report Authenticated By: Marlon Pel, M.D.   Mr Angiogram Neck W Wo Contrast  04/22/2011  *RADIOLOGY REPORT*  Clinical Data:  TIA.  Sudden onset left upper extremity and left lower extremity numbness last night.  The symptoms resolved after proximally 1 hour.  Marked hypertension.  MRI HEAD WITHOUT AND WITH CONTRAST MRA HEAD WITHOUT CONTRAST MRA NECK WITHOUT AND WITH CONTRAST  Technique:  Multiplanar, multiecho pulse sequences of the brain and surrounding structures were obtained without and with intravenous  contrast.  Angiographic images of the Circle of Willis were obtained using MRA technique without intravenous contrast. Angiographic images of the neck were obtained using MRA technique without and with intravenous contrast.  Carotid stenosis measurements (when applicable) are obtained utilizing NASCET criteria, using the distal internal carotid diameter as the denominator.  Contrast: 20mL MULTIHANCE GADOBENATE DIMEGLUMINE 529 MG/ML IV SOLN  Comparison:  CT head without contrast 04/21/2011 at [facility].  MRI HEAD  Findings:  A 9 mm non hemorrhagic acute cortical infarct is present to along the posterior right frontal lobe.  There is an additional punctate area of restricted diffusion within the next gyrus, likely the primary sensory strip.  No hemorrhage or mass lesion is present.  Mild generalized atrophy is present.  Mild periventricular white matter changes are likely within normal limits for age.  There is a remote lacunar white matter infarct of the left corona radiata.  The ventricles are proportionate to the degree of atrophy.  No significant extra-axial fluid collection is present.  Flow is present in the major intracranial arteries.  The globes and orbits are intact.  A small polyp or mucous retention cyst is present in the right maxillary sinus.  The paranasal sinuses and mastoid air cells are otherwise clear.  The postcontrast images are moderately degraded by patient motion. A benign venous angioma is present within the left cerebellum and extending along the vermis.  The postcontrast images demonstrate no other areas of pathologic enhancement.  There is no enhancement associated with the areas of acute  infarction.  IMPRESSION:  1.  Acute non hemorrhagic cortical infarct involving the posterior right frontal lobe. 2.  Second punctate area of acute non hemorrhagic infarction within the anterior right parietal lobe.  3.  Mild atrophy white matter disease is likely within normal limits for age. 4.   Remote lacunar infarct of the left corona radiata.  MRA HEAD  Findings: The internal carotid arteries are within normal limits bilaterally.  The A1 and M1 segments are normal.  The anterior communicating artery is patent.  There is an early bifurcation of the right A2 segment, a normal variant.  Segmental irregularities are noted within the ACA and MCA branch vessels without significant proximal stenosis or occlusion.  There is no aneurysm.  The left vertebral artery is slightly dominant to the right.  The left PICA origin is visualized and normal.  The right AICA is dominant.  The basilar artery is normal.  Both posterior cerebral arteries originate from the basilar tip.  PCA branch vessels are within normal limits bilaterally.  IMPRESSION:  1.  No significant proximal stenosis, aneurysm, or branch vessel occlusion. 2.  Mild distal small vessel disease, likely within normal limits for age.  MRA NECK  Findings: The time-of-flight images demonstrate no significant flow disturbance at either carotid bifurcation.  Flow is antegrade within the vertebral arteries bilaterally.  A standard three-vessel arch configuration is present.  Narrowing of the proximal right vertebral artery extends for 6 mm, and measures approximately 60% relative to the distal vessel.  The left vertebral artery origin is mildly tortuous but without stenosis.  The vertebral arteries are otherwise within normal limits throughout their course in the neck.  There is some signal loss within the proximal right common carotid artery, felt be artifactual.  The right common carotid artery is otherwise within normal limits.  There is no significant stenosis at the bifurcation.  Minimal atherosclerotic irregularity is evident along the medial wall of the proximal internal carotid artery without significant stenosis.  The cervical right internal carotid artery is otherwise normal.  The left common carotid artery is normal.  There is a medial plaque along the  proximal left internal carotid artery without significant stenosis relative to the distal vessel. The cervical left internal carotid artery is otherwise normal.  IMPRESSION:  1.  60% stenosis of the proximal right vertebral artery. 2.  Mild atherosclerotic irregularity at the carotid bifurcations bilaterally without significant stenosis relative to the distal vessels.  These results were called by telephone on 04/22/2011  at  09:55 a.m. to  Jacelyn Pi, PA, who verbally acknowledged these results.  Original Report Authenticated By: Jamesetta Orleans. MATTERN, M.D.   Mr Laqueta Jean ZO Contrast  04/22/2011  *RADIOLOGY REPORT*  Clinical Data:  TIA.  Sudden onset left upper extremity and left lower extremity numbness last night.  The symptoms resolved after proximally 1 hour.  Marked hypertension.  MRI HEAD WITHOUT AND WITH CONTRAST MRA HEAD WITHOUT CONTRAST MRA NECK WITHOUT AND WITH CONTRAST  Technique:  Multiplanar, multiecho pulse sequences of the brain and surrounding structures were obtained without and with intravenous contrast.  Angiographic images of the Circle of Willis were obtained using MRA technique without intravenous contrast. Angiographic images of the neck were obtained using MRA technique without and with intravenous contrast.  Carotid stenosis measurements (when applicable) are obtained utilizing NASCET criteria, using the distal internal carotid diameter as the denominator.  Contrast: 20mL MULTIHANCE GADOBENATE DIMEGLUMINE 529 MG/ML IV SOLN  Comparison:  CT head without contrast 04/21/2011  at [facility].  MRI HEAD  Findings:  A 9 mm non hemorrhagic acute cortical infarct is present to along the posterior right frontal lobe.  There is an additional punctate area of restricted diffusion within the next gyrus, likely the primary sensory strip.  No hemorrhage or mass lesion is present.  Mild generalized atrophy is present.  Mild periventricular white matter changes are likely within normal  limits for age.  There is a remote lacunar white matter infarct of the left corona radiata.  The ventricles are proportionate to the degree of atrophy.  No significant extra-axial fluid collection is present.  Flow is present in the major intracranial arteries.  The globes and orbits are intact.  A small polyp or mucous retention cyst is present in the right maxillary sinus.  The paranasal sinuses and mastoid air cells are otherwise clear.  The postcontrast images are moderately degraded by patient motion. A benign venous angioma is present within the left cerebellum and extending along the vermis.  The postcontrast images demonstrate no other areas of pathologic enhancement.  There is no enhancement associated with the areas of acute infarction.  IMPRESSION:  1.  Acute non hemorrhagic cortical infarct involving the posterior right frontal lobe. 2.  Second punctate area of acute non hemorrhagic infarction within the anterior right parietal lobe.  3.  Mild atrophy white matter disease is likely within normal limits for age. 4.  Remote lacunar infarct of the left corona radiata.  MRA HEAD  Findings: The internal carotid arteries are within normal limits bilaterally.  The A1 and M1 segments are normal.  The anterior communicating artery is patent.  There is an early bifurcation of the right A2 segment, a normal variant.  Segmental irregularities are noted within the ACA and MCA branch vessels without significant proximal stenosis or occlusion.  There is no aneurysm.  The left vertebral artery is slightly dominant to the right.  The left PICA origin is visualized and normal.  The right AICA is dominant.  The basilar artery is normal.  Both posterior cerebral arteries originate from the basilar tip.  PCA branch vessels are within normal limits bilaterally.  IMPRESSION:  1.  No significant proximal stenosis, aneurysm, or branch vessel occlusion. 2.  Mild distal small vessel disease, likely within normal limits for age.   MRA NECK  Findings: The time-of-flight images demonstrate no significant flow disturbance at either carotid bifurcation.  Flow is antegrade within the vertebral arteries bilaterally.  A standard three-vessel arch configuration is present.  Narrowing of the proximal right vertebral artery extends for 6 mm, and measures approximately 60% relative to the distal vessel.  The left vertebral artery origin is mildly tortuous but without stenosis.  The vertebral arteries are otherwise within normal limits throughout their course in the neck.  There is some signal loss within the proximal right common carotid artery, felt be artifactual.  The right common carotid artery is otherwise within normal limits.  There is no significant stenosis at the bifurcation.  Minimal atherosclerotic irregularity is evident along the medial wall of the proximal internal carotid artery without significant stenosis.  The cervical right internal carotid artery is otherwise normal.  The left common carotid artery is normal.  There is a medial plaque along the proximal left internal carotid artery without significant stenosis relative to the distal vessel. The cervical left internal carotid artery is otherwise normal.  IMPRESSION:  1.  60% stenosis of the proximal right vertebral artery. 2.  Mild atherosclerotic  irregularity at the carotid bifurcations bilaterally without significant stenosis relative to the distal vessels.  These results were called by telephone on 04/22/2011  at  09:55 a.m. to  Jacelyn Pi, PA, who verbally acknowledged these results.  Original Report Authenticated By: Jamesetta Orleans. MATTERN, M.D.   Mr Garrett Henson  04/22/2011  *RADIOLOGY REPORT*  Clinical Data:  TIA.  Sudden onset left upper extremity and left lower extremity numbness last night.  The symptoms resolved after proximally 1 hour.  Marked hypertension.  MRI HEAD WITHOUT AND WITH CONTRAST MRA HEAD WITHOUT CONTRAST MRA NECK WITHOUT AND WITH CONTRAST   Technique:  Multiplanar, multiecho pulse sequences of the brain and surrounding structures were obtained without and with intravenous contrast.  Angiographic images of the Circle of Willis were obtained using MRA technique without intravenous contrast. Angiographic images of the neck were obtained using MRA technique without and with intravenous contrast.  Carotid stenosis measurements (when applicable) are obtained utilizing NASCET criteria, using the distal internal carotid diameter as the denominator.  Contrast: 20mL MULTIHANCE GADOBENATE DIMEGLUMINE 529 MG/ML IV SOLN  Comparison:  CT head without contrast 04/21/2011 at [facility].  MRI HEAD  Findings:  A 9 mm non hemorrhagic acute cortical infarct is present to along the posterior right frontal lobe.  There is an additional punctate area of restricted diffusion within the next gyrus, likely the primary sensory strip.  No hemorrhage or mass lesion is present.  Mild generalized atrophy is present.  Mild periventricular white matter changes are likely within normal limits for age.  There is a remote lacunar white matter infarct of the left corona radiata.  The ventricles are proportionate to the degree of atrophy.  No significant extra-axial fluid collection is present.  Flow is present in the major intracranial arteries.  The globes and orbits are intact.  A small polyp or mucous retention cyst is present in the right maxillary sinus.  The paranasal sinuses and mastoid air cells are otherwise clear.  The postcontrast images are moderately degraded by patient motion. A benign venous angioma is present within the left cerebellum and extending along the vermis.  The postcontrast images demonstrate no other areas of pathologic enhancement.  There is no enhancement associated with the areas of acute infarction.    IMPRESSION:  1.  Acute non hemorrhagic cortical infarct involving the posterior right frontal lobe. 2.  Second punctate area of acute non  hemorrhagic infarction within the anterior right parietal lobe.  3.  Mild atrophy white matter disease is likely within normal limits for age. 4.  Remote lacunar infarct of the left corona radiata.    MRA HEAD  Findings: The internal carotid arteries are within normal limits bilaterally.  The A1 and M1 segments are normal.  The anterior communicating artery is patent.  There is an early bifurcation of the right A2 segment, a normal variant.  Segmental irregularities are noted within the ACA and MCA branch vessels without significant proximal stenosis or occlusion.  There is no aneurysm.  The left vertebral artery is slightly dominant to the right.  The left PICA origin is visualized and normal.  The right AICA is dominant.  The basilar artery is normal.  Both posterior cerebral arteries originate from the basilar tip.  PCA branch vessels are within normal limits bilaterally.    IMPRESSION:  1.  No significant proximal stenosis, aneurysm, or branch vessel occlusion. 2.  Mild distal small vessel disease, likely within normal limits for age.  MRA NECK  Findings: The time-of-flight images demonstrate no significant flow disturbance at either carotid bifurcation.  Flow is antegrade within the vertebral arteries bilaterally.  A standard three-vessel arch configuration is present.  Narrowing of the proximal right vertebral artery extends for 6 mm, and measures approximately 60% relative to the distal vessel.  The left vertebral artery origin is mildly tortuous but without stenosis.  The vertebral arteries are otherwise within normal limits throughout their course in the neck.  There is some signal loss within the proximal right common carotid artery, felt be artifactual.  The right common carotid artery is otherwise within normal limits.  There is no significant stenosis at the bifurcation.  Minimal atherosclerotic irregularity is evident along the medial wall of the proximal internal carotid artery without  significant stenosis.  The cervical right internal carotid artery is otherwise normal.  The left common carotid artery is normal.  There is a medial plaque along the proximal left internal carotid artery without significant stenosis relative to the distal vessel. The cervical left internal carotid artery is otherwise normal.    IMPRESSION:  1.  60% stenosis of the proximal right vertebral artery. 2.  Mild atherosclerotic irregularity at the carotid bifurcations bilaterally without significant stenosis relative to the distal vessels.  These results were called by telephone on 04/22/2011  at  09:55 a.m. to  Jacelyn Pi, PA, who verbally acknowledged these results.  Original Report Authenticated By: Jamesetta Orleans. MATTERN, M.D.    Scheduled Meds:   . acetaminophen  650 mg Oral Once  . clopidogrel  75 mg Oral Q breakfast  . diphenhydrAMINE  25 mg Oral Once  . fish oil-omega-3 fatty acids  1 g Oral Q0700  . heparin  5,000 Units Subcutaneous Q8H  . insulin aspart  0-9 Units Subcutaneous TID WC  . isosorbide mononitrate  60 mg Oral Q0700  . multivitamins ther. w/minerals  1 tablet Oral Q0700  . ranolazine  500 mg Oral QHS  . rosuvastatin  10 mg Oral QHS  . sodium chloride  3 mL Intravenous Q12H  . DISCONTD: aspirin  325 mg Oral Daily  . DISCONTD: methylPREDNISolone (SOLU-MEDROL) injection  60 mg Intravenous 1 day or 1 dose   Continuous Infusions:   . DISCONTD: sodium chloride    . DISCONTD: sodium chloride 100 mL/hr at 04/22/11 1225   PRN Meds:.sodium chloride, acetaminophen, nitroGLYCERIN, senna-docusate, sodium chloride  Assessment & Plan   Stroke (04/22/2011) Admit patient to telemetry. Improving L.sided weakness almost resolved, Neurology already evaluated the patient, they recommended Plavix for secondary stroke prevention stop aspirin . Will check 2-D echo, fasting lipid panel, hemoglobin A1c is 6.79 resumed glucophage . Will ask for physical therapist occupational therapy and  speech therapy evaluation.     Chest pain (04/22/2011)  Chronic intermittent, has CAD, continue Imdur and renexa, ruled out for MI, pain free.  Lab Results  Component Value Date   CKTOTAL 63 04/23/2011   CKMB 2.6 04/23/2011   TROPONINI <0.30 04/23/2011      Hypertension (04/22/2011) Will avoid to decrease blood pressure in event of acute stroke. Will add low dose B Blocker in am post 48 hrs    DM-2-  Switch to ADA diet, on Glucophage,    Lab Results  Component Value Date   HGBA1C 6.7* 04/21/2011      Dyslipidemia - continue on statin.  Results for Garrett Henson, Garrett Henson (MRN [id number]) as of 04/23/2011 10:05  Ref. Range 04/23/2011 03:55  Cholesterol Latest Range: 0-200 mg/dL  145  Triglycerides Latest Range: <150 mg/dL 77  HDL Latest Range: >96 mg/dL 59  LDL (calc) Latest Range: 0-99 mg/dL 71  VLDL Latest Range: 0-40 mg/dL 15  Total CHOL/HDL Ratio No range found 2.5    Infiltrate on CXR  - no cough-SOB ,Fever or Leukocytosis - ? Atelectasis, CXR 2 view, monitor clinically, IS, speech eval.   DVT Prophylaxis   SCDs  See all Orders from today for further details     Leroy Sea M.D 04/23/2011, 10:14 AM  [facility] Office  [phone]

## 2011-04-23 NOTE — Progress Notes (Signed)
Physical Therapy Evaluation Patient Details Name: Garrett Henson MRN: [id number] DOB: 02-26-1925 Today's Date: 04/23/2011  Problem List:  Patient Active Problem List  Diagnoses  . Stroke  . Chest pain  . Hypertension  . DM2 (diabetes mellitus, type 2)  . CAD (coronary artery disease)    Past Medical History:  Past Medical History  Diagnosis Date  . Diabetes mellitus   . Coronary artery disease   . Prostate cancer    Past Surgical History:  Past Surgical History  Procedure Date  . Prostatectomy   . Hip surgery     PT Assessment/Plan/Recommendation PT Assessment Clinical Impression Statement: Pt is an 77 y/o male admitted with left sided weakness and r/o CVA along with the below PT problem list.  Pt would benefit from acute PT to maximize independence and facilitate d/c home with possible Outpatient PT to follow-up. PT Recommendation/Assessment: Patient will need skilled PT in the acute care venue PT Problem List: Decreased activity tolerance;Decreased balance;Decreased mobility Barriers to Discharge: None PT Therapy Diagnosis : Difficulty walking PT Plan PT Frequency: Min 4X/week PT Treatment/Interventions: Gait training;Functional mobility training;Balance training;Patient/family education;Neuromuscular re-education PT Recommendation Follow Up Recommendations: Outpatient PT Equipment Recommended: None recommended by PT PT Goals  Acute Rehab PT Goals PT Goal Formulation: With patient/family Time For Goal Achievement: 7 days Pt will Ambulate: >150 feet;with modified independence;with least restrictive assistive device PT Goal: Ambulate - Progress: Other (comment) (Set today.) Additional Goals Additional Goal #1: Pt will score > or = 19/24 on DGI demonstrating decreased risk for falls. PT Goal: Additional Goal #1 - Progress: Other (comment) (Set today.)  PT Evaluation Precautions/Restrictions  Precautions Precautions: Fall Required Braces or Orthoses:  No Restrictions Weight Bearing Restrictions: No Prior Functioning  Home Living Lives With: Spouse Receives Help From: Family (PRN.) Type of Home: House Home Layout: Two level;Able to live on main level with bedroom/bathroom Alternate Level Stairs-Rails:  (Basement.  Does not need to access.) Home Access: Ramped entrance;Stairs to enter Entrance Stairs-Rails: Right Entrance Stairs-Number of Steps: 3 Home Adaptive Equipment: None Prior Function Level of Independence: Independent with basic ADLs;Independent with homemaking with ambulation;Independent with gait;Independent with transfers Able to Take Stairs?: Yes Driving: Yes Cognition Cognition Arousal/Alertness: Awake/alert Overall Cognitive Status: Appears within functional limits for tasks assessed Orientation Level: Oriented X4 Sensation/Coordination Sensation Light Touch: Appears Intact Stereognosis: Not tested Hot/Cold: Not tested Proprioception: Not tested Coordination Gross Motor Movements are Fluid and Coordinated: Yes Fine Motor Movements are Fluid and Coordinated: Not tested Extremity Assessment RUE Assessment RUE Assessment: Not tested LUE Assessment LUE Assessment: Not tested RLE Assessment RLE Assessment: Within Functional Limits LLE Assessment LLE Assessment: Within Functional Limits Pain 0/10 with treatment. Mobility (including Balance) Bed Mobility Bed Mobility: Yes Supine to Sit: 6: Modified independent (Device/Increase time) Transfers Transfers: Yes Sit to Stand: 6: Modified independent (Device/Increase time) Stand to Sit: 6: Modified independent (Device/Increase time) Ambulation/Gait Ambulation/Gait: Yes Ambulation/Gait Assistance: 4: Min assist Ambulation/Gait Assistance Details (indicate cue type and reason): Assist for balance during higher level balance activities such as head turns and turning.  Pt able to self correct LOB mostly.  However, min assist requiring to fully correct. Ambulation  Distance (Feet): 380 Feet Assistive device: None Gait Pattern: Decreased step length - right;Decreased step length - left Stairs: No Wheelchair Mobility Wheelchair Mobility: No  Posture/Postural Control Posture/Postural Control: No significant limitations Balance Balance Assessed: No End of Session PT - End of Session Equipment Utilized During Treatment: Gait belt Activity Tolerance: Patient tolerated treatment well Patient  left: in bed;with call bell in reach;with family/visitor present (Sitting EOB.) Nurse Communication: Mobility status for transfers;Mobility status for ambulation General Behavior During Session: [facility] for tasks performed Cognition: [facility] for tasks performed  Cephus Shelling 04/23/2011, 11:01 AM  04/23/2011 Cephus Shelling, PT, DPT [phone]

## 2011-04-24 LAB — GLUCOSE, CAPILLARY: Glucose-Capillary: 150 mg/dL — ABNORMAL HIGH (ref 70–99)

## 2011-04-24 MED ORDER — AMLODIPINE BESYLATE 5 MG PO TABS
5.0000 mg | ORAL_TABLET | Freq: Every day | ORAL | Status: DC
  Filled 2011-04-24: qty 1

## 2011-04-24 MED ORDER — CLOPIDOGREL BISULFATE 75 MG PO TABS: 75.0000 mg | ORAL_TABLET | Freq: Every day | ORAL | Status: AC

## 2011-04-24 MED ORDER — AMLODIPINE BESYLATE 5 MG PO TABS: 5.0000 mg | ORAL_TABLET | Freq: Every day | ORAL | Status: DC

## 2011-04-24 NOTE — Progress Notes (Signed)
Utilization Review Completed.  Corlette Ciano T  04/24/2011 

## 2011-04-24 NOTE — Progress Notes (Signed)
   CARE MANAGEMENT NOTE 04/24/2011  Patient:  Garrett Henson, Garrett Henson   Account Number:  [id number]  Date Initiated:  04/24/2011  Documentation initiated by:  Junius Creamer  Subjective/Objective Assessment:   adm w r/o stroke     Action/Plan:   lives w wife, phy ther rec outpt phy ther, pt lives in [place] and he will arrange his own therapy ther. have req md send pt prescription for outpt phy ther so he can arrange   Anticipated DC Date:  04/24/2011   Anticipated DC Plan:  HOME/SELF CARE      DC Planning Services  CM consult      Choice offered to / List presented to:             Status of service:   Medicare Important Message given?   (If response is "NO", the following Medicare IM given date fields will be blank) Date Medicare IM given:   Date Additional Medicare IM given:    Discharge Disposition:  HOME/SELF CARE  Per UR Regulation:  Reviewed for med. necessity/level of care/duration of stay  Comments:  12/10 spoke w pt, he wants outpt phy ther in [place], he just needs prescription and he and wife can arrange when they want to sched. have req md send prescrip for pt to take home. debbie Kamara Allan rn,bsn [id number]

## 2011-04-24 NOTE — Discharge Summary (Addendum)
Garrett Henson, 77 y.o., DOB February 18, 1925, MRN [id number]. Admission date: 04/21/2011 Discharge Date 04/24/2011 Primary MD No primary provider on file. Admitting Physician Hartley Barefoot, MD  Admission Diagnosis  Cerebrovascular accident [434.91] CVA  Discharge Diagnosis   Principal Problem:  *Stroke Active Problems:  Chest pain  Hypertension  DM2 (diabetes mellitus, type 2)  CAD (coronary artery disease)      Past Medical History  Diagnosis Date  . Diabetes mellitus   . Coronary artery disease   . Prostate cancer     Past Surgical History  Procedure Date  . Prostatectomy   . Hip surgery     Consults  Neurology Dr Patrecia Pace and Lyman Speller (cleared to go home discussed Carotid results)   Significant Tests:  See full reports for all details    Carotid US - Carotid duplex completed. Right 40% to 59 % ICA stenosis lower end of scale. Left 40% to 59% ICA stenosis upper end of scale. Vertebral arteries are patent and flow is antegrade..   Echo  - Left ventricle: The cavity size was normal. Systolic function was normal. The estimated ejection fraction was in the range of 55% to 60%. Wall motion was normal; there were no regional wall motion abnormalities. Doppler parameters are consistent with abnormal left ventricular relaxation (grade 1 diastolic dysfunction). - Aortic valve: Mildly calcified annulus. Mildly thickened leaflets. - Mitral valve: Calcified annulus. Mildly calcified leaflets . Mild regurgitation. - Atrial septum: No defect or patent foramen ovale was identified. Impressions:  - No cardiac source of embolism was identified, but cannot be ruled out on the basis of this examination.   Dg Chest 2 View  04/23/2011  *RADIOLOGY REPORT*  Clinical Data: Stroke  CHEST - 2 VIEW  Comparison: 04/21/2011  Findings: Hyperaeration.  Ovoid opacity in the right lower lung zone has developed.  Linear atelectasis at the left base.  No pneumothorax.  No pleural effusion.  Stable T12  compression deformity.  Osteopenia.  IMPRESSION: New right lower lung zone ovoid opacity.  Pulmonary nodule is not excluded.  This may represent focal airspace disease.  Attention on short-term follow-up.  Original Report Authenticated By: Donavan Burnet, M.D.   Dg Chest 2 View  04/21/2011  *RADIOLOGY REPORT*  Clinical Data: Hypertension.  Left upper extremity numbness and paresthesias.  CHEST - 2 VIEW 04/21/2011:  Comparison: Two-view chest x-ray 06/18/2009 [facility].  Findings: Cardiac silhouette enlarged but stable.  Thoracic aorta mildly tortuous and atherosclerotic consistent with age, unchanged. Hilar and mediastinal contours otherwise unremarkable.  Lungs clear.  Bronchovascular markings normal.  No pleural effusions. Degenerative changes and dish throughout the thoracic spine.  IMPRESSION: Stable cardiomegaly.  No acute cardiopulmonary disease.  Original Report Authenticated By: Arnell Sieving, M.D.   Ct Head Wo Contrast  04/21/2011  *RADIOLOGY REPORT*  Clinical Data: Acute onset of numbness and tingling in the left arm and leg at 07:30 p.m.  The patient was hypertensive.  CT HEAD WITHOUT CONTRAST  Technique:  Contiguous axial images were obtained from the base of the skull through the vertex without contrast.  Comparison: None.  Findings: Mild diffuse cerebral atrophy.  Mild asymmetric ventricular enlargement on the left, likely due to central atrophy. No mass effect or midline shift.  No abnormal extra-axial fluid collections.  Patchy low attenuation changes in the deep white matter probably due to small vessel ischemic change.  The gray- white matter junctions are distinct.  The basal cisterns are not effaced. No evidence of acute intracranial hemorrhage.  No depressed skull fractures. Visualized mastoid air cells and paranasal sinuses are not opacified.  Vascular calcifications in the intracranial arteries.  IMPRESSION: Mild diffuse atrophy and small vessel ischemic change.  No  evidence of acute intracranial hemorrhage, mass lesion, or acute infarct. Note that MRI is more sensitive for detection of acute infarct.  Original Report Authenticated By: Marlon Pel, M.D.   Mr Angiogram Neck W Wo Contrast  04/22/2011  *RADIOLOGY REPORT*  Clinical Data:  TIA.  Sudden onset left upper extremity and left lower extremity numbness last night.  The symptoms resolved after proximally 1 hour.  Marked hypertension.  MRI HEAD WITHOUT AND WITH CONTRAST MRA HEAD WITHOUT CONTRAST MRA NECK WITHOUT AND WITH CONTRAST  Technique:  Multiplanar, multiecho pulse sequences of the brain and surrounding structures were obtained without and with intravenous contrast.  Angiographic images of the Circle of Willis were obtained using MRA technique without intravenous contrast. Angiographic images of the neck were obtained using MRA technique without and with intravenous contrast.  Carotid stenosis measurements (when applicable) are obtained utilizing NASCET criteria, using the distal internal carotid diameter as the denominator.  Contrast: 20mL MULTIHANCE GADOBENATE DIMEGLUMINE 529 MG/ML IV SOLN  Comparison:  CT head without contrast 04/21/2011 at [facility].  MRI HEAD  Findings:  A 9 mm non hemorrhagic acute cortical infarct is present to along the posterior right frontal lobe.  There is an additional punctate area of restricted diffusion within the next gyrus, likely the primary sensory strip.  No hemorrhage or mass lesion is present.  Mild generalized atrophy is present.  Mild periventricular white matter changes are likely within normal limits for age.  There is a remote lacunar white matter infarct of the left corona radiata.  The ventricles are proportionate to the degree of atrophy.  No significant extra-axial fluid collection is present.  Flow is present in the major intracranial arteries.  The globes and orbits are intact.  A small polyp or mucous retention cyst is present in the right maxillary  sinus.  The paranasal sinuses and mastoid air cells are otherwise clear.  The postcontrast images are moderately degraded by patient motion. A benign venous angioma is present within the left cerebellum and extending along the vermis.  The postcontrast images demonstrate no other areas of pathologic enhancement.  There is no enhancement associated with the areas of acute infarction.  IMPRESSION:  1.  Acute non hemorrhagic cortical infarct involving the posterior right frontal lobe. 2.  Second punctate area of acute non hemorrhagic infarction within the anterior right parietal lobe.  3.  Mild atrophy white matter disease is likely within normal limits for age. 4.  Remote lacunar infarct of the left corona radiata.  MRA HEAD  Findings: The internal carotid arteries are within normal limits bilaterally.  The A1 and M1 segments are normal.  The anterior communicating artery is patent.  There is an early bifurcation of the right A2 segment, a normal variant.  Segmental irregularities are noted within the ACA and MCA branch vessels without significant proximal stenosis or occlusion.  There is no aneurysm.  The left vertebral artery is slightly dominant to the right.  The left PICA origin is visualized and normal.  The right AICA is dominant.  The basilar artery is normal.  Both posterior cerebral arteries originate from the basilar tip.  PCA branch vessels are within normal limits bilaterally.  IMPRESSION:  1.  No significant proximal stenosis, aneurysm, or branch vessel occlusion. 2.  Mild distal  small vessel disease, likely within normal limits for age.  MRA NECK  Findings: The time-of-flight images demonstrate no significant flow disturbance at either carotid bifurcation.  Flow is antegrade within the vertebral arteries bilaterally.  A standard three-vessel arch configuration is present.  Narrowing of the proximal right vertebral artery extends for 6 mm, and measures approximately 60% relative to the distal vessel.   The left vertebral artery origin is mildly tortuous but without stenosis.  The vertebral arteries are otherwise within normal limits throughout their course in the neck.  There is some signal loss within the proximal right common carotid artery, felt be artifactual.  The right common carotid artery is otherwise within normal limits.  There is no significant stenosis at the bifurcation.  Minimal atherosclerotic irregularity is evident along the medial wall of the proximal internal carotid artery without significant stenosis.  The cervical right internal carotid artery is otherwise normal.  The left common carotid artery is normal.  There is a medial plaque along the proximal left internal carotid artery without significant stenosis relative to the distal vessel. The cervical left internal carotid artery is otherwise normal.  IMPRESSION:  1.  60% stenosis of the proximal right vertebral artery. 2.  Mild atherosclerotic irregularity at the carotid bifurcations bilaterally without significant stenosis relative to the distal vessels.  These results were called by telephone on 04/22/2011  at  09:55 a.m. to  Jacelyn Pi, PA, who verbally acknowledged these results.  Original Report Authenticated By: Jamesetta Orleans. MATTERN, M.D.   Mr Laqueta Jean EA Contrast  04/22/2011  *RADIOLOGY REPORT*  Clinical Data:  TIA.  Sudden onset left upper extremity and left lower extremity numbness last night.  The symptoms resolved after proximally 1 hour.  Marked hypertension.  MRI HEAD WITHOUT AND WITH CONTRAST MRA HEAD WITHOUT CONTRAST MRA NECK WITHOUT AND WITH CONTRAST  Technique:  Multiplanar, multiecho pulse sequences of the brain and surrounding structures were obtained without and with intravenous contrast.  Angiographic images of the Circle of Willis were obtained using MRA technique without intravenous contrast. Angiographic images of the neck were obtained using MRA technique without and with intravenous contrast.  Carotid  stenosis measurements (when applicable) are obtained utilizing NASCET criteria, using the distal internal carotid diameter as the denominator.  Contrast: 20mL MULTIHANCE GADOBENATE DIMEGLUMINE 529 MG/ML IV SOLN  Comparison:  CT head without contrast 04/21/2011 at [facility].  MRI HEAD  Findings:  A 9 mm non hemorrhagic acute cortical infarct is present to along the posterior right frontal lobe.  There is an additional punctate area of restricted diffusion within the next gyrus, likely the primary sensory strip.  No hemorrhage or mass lesion is present.  Mild generalized atrophy is present.  Mild periventricular white matter changes are likely within normal limits for age.  There is a remote lacunar white matter infarct of the left corona radiata.  The ventricles are proportionate to the degree of atrophy.  No significant extra-axial fluid collection is present.  Flow is present in the major intracranial arteries.  The globes and orbits are intact.  A small polyp or mucous retention cyst is present in the right maxillary sinus.  The paranasal sinuses and mastoid air cells are otherwise clear.  The postcontrast images are moderately degraded by patient motion. A benign venous angioma is present within the left cerebellum and extending along the vermis.  The postcontrast images demonstrate no other areas of pathologic enhancement.  There is no enhancement associated with the areas of  acute infarction.  IMPRESSION:  1.  Acute non hemorrhagic cortical infarct involving the posterior right frontal lobe. 2.  Second punctate area of acute non hemorrhagic infarction within the anterior right parietal lobe.  3.  Mild atrophy white matter disease is likely within normal limits for age. 4.  Remote lacunar infarct of the left corona radiata.  MRA HEAD  Findings: The internal carotid arteries are within normal limits bilaterally.  The A1 and M1 segments are normal.  The anterior communicating artery is patent.  There is  an early bifurcation of the right A2 segment, a normal variant.  Segmental irregularities are noted within the ACA and MCA branch vessels without significant proximal stenosis or occlusion.  There is no aneurysm.  The left vertebral artery is slightly dominant to the right.  The left PICA origin is visualized and normal.  The right AICA is dominant.  The basilar artery is normal.  Both posterior cerebral arteries originate from the basilar tip.  PCA branch vessels are within normal limits bilaterally.  IMPRESSION:  1.  No significant proximal stenosis, aneurysm, or branch vessel occlusion. 2.  Mild distal small vessel disease, likely within normal limits for age.  MRA NECK  Findings: The time-of-flight images demonstrate no significant flow disturbance at either carotid bifurcation.  Flow is antegrade within the vertebral arteries bilaterally.  A standard three-vessel arch configuration is present.  Narrowing of the proximal right vertebral artery extends for 6 mm, and measures approximately 60% relative to the distal vessel.  The left vertebral artery origin is mildly tortuous but without stenosis.  The vertebral arteries are otherwise within normal limits throughout their course in the neck.  There is some signal loss within the proximal right common carotid artery, felt be artifactual.  The right common carotid artery is otherwise within normal limits.  There is no significant stenosis at the bifurcation.  Minimal atherosclerotic irregularity is evident along the medial wall of the proximal internal carotid artery without significant stenosis.  The cervical right internal carotid artery is otherwise normal.  The left common carotid artery is normal.  There is a medial plaque along the proximal left internal carotid artery without significant stenosis relative to the distal vessel. The cervical left internal carotid artery is otherwise normal.  IMPRESSION:  1.  60% stenosis of the proximal right vertebral artery.  2.  Mild atherosclerotic irregularity at the carotid bifurcations bilaterally without significant stenosis relative to the distal vessels.  These results were called by telephone on 04/22/2011  at  09:55 a.m. to  Jacelyn Pi, PA, who verbally acknowledged these results.  Original Report Authenticated By: Jamesetta Orleans. MATTERN, M.D.   Mr Maxine Glenn Head/brain Wo Cm  04/22/2011  *RADIOLOGY REPORT*  Clinical Data:  TIA.  Sudden onset left upper extremity and left lower extremity numbness last night.  The symptoms resolved after proximally 1 hour.  Marked hypertension.  MRI HEAD WITHOUT AND WITH CONTRAST MRA HEAD WITHOUT CONTRAST MRA NECK WITHOUT AND WITH CONTRAST  Technique:  Multiplanar, multiecho pulse sequences of the brain and surrounding structures were obtained without and with intravenous contrast.  Angiographic images of the Circle of Willis were obtained using MRA technique without intravenous contrast. Angiographic images of the neck were obtained using MRA technique without and with intravenous contrast.  Carotid stenosis measurements (when applicable) are obtained utilizing NASCET criteria, using the distal internal carotid diameter as the denominator.  Contrast: 20mL MULTIHANCE GADOBENATE DIMEGLUMINE 529 MG/ML IV SOLN  Comparison:  CT head without contrast  04/21/2011 at [facility].  MRI HEAD  Findings:  A 9 mm non hemorrhagic acute cortical infarct is present to along the posterior right frontal lobe.  There is an additional punctate area of restricted diffusion within the next gyrus, likely the primary sensory strip.  No hemorrhage or mass lesion is present.  Mild generalized atrophy is present.  Mild periventricular white matter changes are likely within normal limits for age.  There is a remote lacunar white matter infarct of the left corona radiata.  The ventricles are proportionate to the degree of atrophy.  No significant extra-axial fluid collection is present.  Flow is present in the  major intracranial arteries.  The globes and orbits are intact.  A small polyp or mucous retention cyst is present in the right maxillary sinus.  The paranasal sinuses and mastoid air cells are otherwise clear.  The postcontrast images are moderately degraded by patient motion. A benign venous angioma is present within the left cerebellum and extending along the vermis.  The postcontrast images demonstrate no other areas of pathologic enhancement.  There is no enhancement associated with the areas of acute infarction.  IMPRESSION:  1.  Acute non hemorrhagic cortical infarct involving the posterior right frontal lobe. 2.  Second punctate area of acute non hemorrhagic infarction within the anterior right parietal lobe.  3.  Mild atrophy white matter disease is likely within normal limits for age. 4.  Remote lacunar infarct of the left corona radiata.  MRA HEAD  Findings: The internal carotid arteries are within normal limits bilaterally.  The A1 and M1 segments are normal.  The anterior communicating artery is patent.  There is an early bifurcation of the right A2 segment, a normal variant.  Segmental irregularities are noted within the ACA and MCA branch vessels without significant proximal stenosis or occlusion.  There is no aneurysm.  The left vertebral artery is slightly dominant to the right.  The left PICA origin is visualized and normal.  The right AICA is dominant.  The basilar artery is normal.  Both posterior cerebral arteries originate from the basilar tip.  PCA branch vessels are within normal limits bilaterally.  IMPRESSION:  1.  No significant proximal stenosis, aneurysm, or branch vessel occlusion. 2.  Mild distal small vessel disease, likely within normal limits for age.  MRA NECK  Findings: The time-of-flight images demonstrate no significant flow disturbance at either carotid bifurcation.  Flow is antegrade within the vertebral arteries bilaterally.  A standard three-vessel arch configuration is  present.  Narrowing of the proximal right vertebral artery extends for 6 mm, and measures approximately 60% relative to the distal vessel.  The left vertebral artery origin is mildly tortuous but without stenosis.  The vertebral arteries are otherwise within normal limits throughout their course in the neck.  There is some signal loss within the proximal right common carotid artery, felt be artifactual.  The right common carotid artery is otherwise within normal limits.  There is no significant stenosis at the bifurcation.  Minimal atherosclerotic irregularity is evident along the medial wall of the proximal internal carotid artery without significant stenosis.  The cervical right internal carotid artery is otherwise normal.  The left common carotid artery is normal.  There is a medial plaque along the proximal left internal carotid artery without significant stenosis relative to the distal vessel. The cervical left internal carotid artery is otherwise normal.  IMPRESSION:  1.  60% stenosis of the proximal right vertebral artery. 2.  Mild  atherosclerotic irregularity at the carotid bifurcations bilaterally without significant stenosis relative to the distal vessels.  These results were called by telephone on 04/22/2011  at  09:55 a.m. to  Jacelyn Pi, PA, who verbally acknowledged these results.  Original Report Authenticated By: Jamesetta Orleans. MATTERN, M.D.    Hospital Course See H&P, Labs, Consult and Test reports for all details in brief, patient was admitted for  Left sided weakness due to new Rt Fronto-Parietal CVA with symptoms resolved, seen by Neurology Dr Patrecia Pace who cleared for discharge, I also D/W Dr Lyman Speller his Carotid US results and he concurred for discharge with out patient Dr Patrecia Pace follow.  Patient also seen by PT (will get out patient PT follow) , No speech or swallowing issues at all, Mild-Mod ICA stenosis which needs close out patient follow, also RML Lung Nodule-needs repeat CXR in 1 week,  he has no cough, no fevers, no Leukocytosis.   Intermittent Chr Chest pain (04/22/2011)  Chronic intermittent, has CAD, continue Imdur and renexa, now on Plavix plus Norvasc low dose added for better BP control, ruled out for MI, pain free. Echo was stable.  Lab Results   Component  Value  Date    CKTOTAL  63  04/23/2011    CKMB  2.6  04/23/2011    TROPONINI  <0.30  04/23/2011     Hypertension (04/22/2011) Now over 48hrs away from CVA, gentle addition of Norvasc, monitor BP closely.   DM-2- Switch to ADA diet, on Glucophage,   Lab Results   Component  Value  Date    HGBA1C  6.7*  04/21/2011     Dyslipidemia - continue on statin.  Results for TREJAN, BUDA (MRN [id number]) as of 04/23/2011 10:05   Ref. Range  04/23/2011 03:55   Cholesterol  Latest Range: 0-200 mg/dL  621   Triglycerides  Latest Range: <150 mg/dL  77   HDL  Latest Range: >39 mg/dL  59   LDL (calc)  Latest Range: 3-08 mg/dL  71   VLDL  Latest Range: 0-40 mg/dL  15   Total CHOL/HDL Ratio  No range found  2.5     Infiltrate on CXR - no cough-SOB , Fever or Leukocytosis - Likely nodule, PCP please check CXR 2 view in 1 week.   Today   Subjective:   Garrett Henson today has no headache,no chest abdominal pain,no new weakness tingling or numbness, feels much better wants to go home today.    Objective:   Blood pressure 162/66, pulse 62, temperature 97.8 F (36.6 C), temperature source Oral, resp. rate 18, weight 81.6 kg (179 lb 14.3 oz), SpO2 97.00%.  Intake/Output Summary (Last 24 hours) at 04/24/11 0825 Last data filed at 04/24/11 0600  Gross per 24 hour  Intake      0 ml  Output   1150 ml  Net  -1150 ml    Exam Awake Alert, Oriented *3, No new F.N deficits, Normal affect [place].AT,PERRAL Supple Neck,No JVD, No cervical lymphadenopathy appriciated.  Symmetrical Chest wall movement, Good air movement bilaterally, CTAB RRR,No Gallops,Rubs or new Murmurs, No Parasternal Heave +ve B.Sounds, Abd Soft,  Non tender, No organomegaly appriciated, No rebound -guarding or rigidity. No Cyanosis, Clubbing or edema, No new Rash or bruise  Data Review   Lab Results  Component Value Date   HGBA1C 6.6* 04/23/2011    Lipid Panel     Component Value Date/Time   CHOL 145 04/23/2011 0355   TRIG 77 04/23/2011 0355  HDL 59 04/23/2011 0355   CHOLHDL 2.5 04/23/2011 0355   VLDL 15 04/23/2011 0355   LDLCALC 71 04/23/2011 0355     CBC w Diff: Lab Results  Component Value Date   WBC 7.4 04/22/2011   HGB 12.9* 04/22/2011   HCT 38.1* 04/22/2011   PLT 216 04/22/2011   LYMPHOPCT 30 04/21/2011   MONOPCT 9 04/21/2011   EOSPCT 12* 04/21/2011   BASOPCT 1 04/21/2011   CMP: Lab Results  Component Value Date   NA 136 04/21/2011   K 3.8 04/21/2011   CL 101 04/21/2011   CO2 26 04/21/2011   BUN 14 04/21/2011   CREATININE 0.94 04/22/2011   PROT 6.4 04/21/2011   ALBUMIN 3.5 04/21/2011   BILITOT 0.2* 04/21/2011   ALKPHOS 69 04/21/2011   AST 18 04/21/2011   ALT 13 04/21/2011  . Discharge Instructions     Follow with Primary MD Roberts,Ronald in 7 days   Get CBC, CMP, checked 7 days by Primary MD and again as instructed by your Primary MD. Get a 2 view Chest X ray done next visit if you had Pneumonia of Lung problems at the Hospital.  Get Medicines reviewed and adjusted.  Please request your Prim.MD to go over all Hospital Tests and Procedure/Radiological results at the follow up, please get all Hospital records sent to your Prim MD by signing hospital release before you go home.  Activity: Fall precautions use walker/cane & assistance as needed  Diet: Heart Healthy-Diabetic  For Heart failure patients - Check your Weight same time everyday, if you gain over 2 pounds, or you develop in leg swelling, experience more shortness of breath or chest pain, call your Primary MD immediately. Follow Cardiac Low Salt Diet and 1.8 lit/day fluid restriction.  Disposition Home  If you experience worsening of your admission  symptoms, develop shortness of breath, life threatening emergency, suicidal or homicidal thoughts you must seek medical attention immediately by calling 911 or calling your MD immediately  if symptoms less severe.  You Must read complete instructions/literature along with all the possible adverse reactions/side effects for all the Medicines you take and that have been prescribed to you. Take any new Medicines after you have completely understood and accpet all the possible adverse reactions/side effects.   Do not drive if your were admitted for syncope or siezures until you have seen by Primary MD or a Neurologist and advised to drive.  Patient will also get Separate Stroke Instructions  Follow-up Information    Follow up with ROBERTS, Vernie Ammons, MD. Make an appointment in 3 days.   Contact information:   [address] [place] [address] [phone]       Follow up with Lesly Dukes, MD. Make an appointment in 1 week.   Contact information:   [address] [place] [address] [facility] [place] [address] [phone]          Discharge Medications   Medication List  As of 04/24/2011  8:38 AM   START taking these medications         amLODipine 5 MG tablet   Commonly known as: NORVASC   Take 1 tablet (5 mg total) by mouth daily.      clopidogrel 75 MG tablet   Commonly known as: PLAVIX   Take 1 tablet (75 mg total) by mouth daily with breakfast.         CONTINUE taking these medications  fish oil-omega-3 fatty acids 1000 MG capsule      isosorbide mononitrate 60 MG 24 hr tablet   Commonly known as: IMDUR      metFORMIN 1000 MG tablet   Commonly known as: GLUCOPHAGE      multivitamins ther. w/minerals Tabs      nitroGLYCERIN 0.4 MG SL tablet   Commonly known as: NITROSTAT      RANEXA 500 MG 12 hr tablet   Generic drug: ranolazine      rosuvastatin 20 MG tablet   Commonly known as: CRESTOR          STOP taking these medications         aspirin EC 81 MG tablet      naproxen sodium 220 MG tablet          Where to get your medications    These are the prescriptions that you need to pick up.   You may get these medications from any pharmacy.         amLODipine 5 MG tablet   clopidogrel 75 MG tablet            Total Time in preparing paper work, data evaluation and todays exam - 35 minutes  Leroy Sea M.D 04/24/2011, 8:25 AM  [facility] Office  [phone]

## 2011-04-24 NOTE — Progress Notes (Signed)
Order received, chart reviewed, noted pt in with LUE weakness and PT recommending OPPT. In to speak to pt and wife about role of OT and they report no issues with BADLs and pt reports LUE weakness has resolved. No acute OT needs identified, will sign off.  Ignacia Palma, OTR/L [phone] 04/24/2011

## 2011-04-24 NOTE — Progress Notes (Signed)
Physical Therapy Treatment Patient Details Name: Garrett Henson MRN: [id number] DOB: June 22, 1924 Today's Date: 04/24/2011  PT Assessment/Plan  PT - Assessment/Plan Comments on Treatment Session: Pt progressing great and reports he is ready to go home. PT Plan: Discharge plan remains appropriate;Frequency remains appropriate PT Frequency: Min 4X/week Follow Up Recommendations: Outpatient PT Equipment Recommended: None recommended by PT PT Goals  Acute Rehab PT Goals PT Goal Formulation: With patient/family Time For Goal Achievement: 7 days Pt will Ambulate: >150 feet;with modified independence;with least restrictive assistive device PT Goal: Ambulate - Progress: Progressing toward goal Additional Goals Additional Goal #1: Pt will score > or = 19/24 on DGI demonstrating decreased risk for falls. PT Goal: Additional Goal #1 - Progress: Met Additional Goal #2: Pt will score > or = to 23/24 on DGI to demonstrate decreased risk for falls. (Set today.) PT Goal: Additional Goal #2 - Progress: Other (comment) (Set 04/24/11.)  PT Treatment Precautions/Restrictions  Precautions Precautions: Fall Required Braces or Orthoses: No Restrictions Weight Bearing Restrictions: No Pain 0/10 pain with treatment. Mobility (including Balance) Bed Mobility Bed Mobility: Yes Supine to Sit: 7: Independent Transfers Transfers: Yes Sit to Stand: 6: Modified independent (Device/Increase time) Stand to Sit: 6: Modified independent (Device/Increase time) Ambulation/Gait Ambulation/Gait: Yes Ambulation/Gait Assistance: 4: Min assist (Mostly supervision with LOB x 1 with obstacle needing min.) Ambulation/Gait Assistance Details (indicate cue type and reason): Assist to correct LOB x 1 with obstacle.  Otherwise, pt supervision with verbal cues for tall posture.  Minimally veering with gait path with pt able to self correct any disturbances to steady posture. Ambulation Distance (Feet): 450  Feet Assistive device: None Gait Pattern: Decreased step length - right;Decreased step length - left Stairs: Yes Stairs Assistance: 5: Supervision Stairs Assistance Details (indicate cue type and reason): Verbal cues for sequence. Stair Management Technique: One rail Right;Alternating pattern Number of Stairs: 2  Height of Stairs: 8  (8 inches.) Wheelchair Mobility Wheelchair Mobility: No  Posture/Postural Control Posture/Postural Control: No significant limitations Balance Balance Assessed: Yes Dynamic Gait Index Level Surface: Normal Change in Gait Speed: Normal Gait with Horizontal Head Turns: Mild Impairment Gait with Vertical Head Turns: Mild Impairment Gait and Pivot Turn: Normal Step Over Obstacle: Mild Impairment Step Around Obstacles: Normal Steps: Mild Impairment Total Score: 20  End of Session PT - End of Session Equipment Utilized During Treatment: Gait belt Activity Tolerance: Patient tolerated treatment well Patient left: in bed;with call bell in reach;with bed alarm set (Sitting EOB.) Nurse Communication: Mobility status for transfers;Mobility status for ambulation General Behavior During Session: [facility] for tasks performed Cognition: [facility] for tasks performed  Cephus Shelling 04/24/2011, 10:09 AM  04/24/2011 Cephus Shelling, PT, DPT [phone]

## 2011-04-24 NOTE — Progress Notes (Signed)
Pt discharge instructions provided. Pt verbalized understanding. Pt i/v dc intact. Pt under no s/s distress.

## 2011-04-24 NOTE — Progress Notes (Signed)
Referral received for "sliding scale insulin teaching". A1c-6.7% indicating good glycemic control.  Will likely not need insulin at home. Note plans for patient to discharge today.  No recs. at this time.

## 2011-10-13 ENCOUNTER — Encounter: Payer: Self-pay | Admitting: *Deleted

## 2012-07-08 ENCOUNTER — Encounter: Payer: Self-pay | Admitting: Cardiology

## 2012-10-17 ENCOUNTER — Encounter (INDEPENDENT_AMBULATORY_CARE_PROVIDER_SITE_OTHER): Payer: Medicare Other | Admitting: Ophthalmology

## 2012-10-17 DIAGNOSIS — E11319 Type 2 diabetes mellitus with unspecified diabetic retinopathy without macular edema: Secondary | ICD-10-CM

## 2012-10-17 DIAGNOSIS — E1165 Type 2 diabetes mellitus with hyperglycemia: Secondary | ICD-10-CM

## 2012-10-17 DIAGNOSIS — H353290 Exudative age-related macular degeneration, unspecified eye, stage unspecified: Secondary | ICD-10-CM

## 2012-10-17 DIAGNOSIS — H43819 Vitreous degeneration, unspecified eye: Secondary | ICD-10-CM

## 2012-10-17 DIAGNOSIS — H35039 Hypertensive retinopathy, unspecified eye: Secondary | ICD-10-CM

## 2012-10-17 DIAGNOSIS — H3530 Unspecified macular degeneration: Secondary | ICD-10-CM

## 2012-10-17 DIAGNOSIS — I10 Essential (primary) hypertension: Secondary | ICD-10-CM

## 2012-11-11 ENCOUNTER — Encounter (INDEPENDENT_AMBULATORY_CARE_PROVIDER_SITE_OTHER): Payer: Medicare Other | Admitting: Ophthalmology

## 2012-11-11 DIAGNOSIS — E1139 Type 2 diabetes mellitus with other diabetic ophthalmic complication: Secondary | ICD-10-CM

## 2012-11-11 DIAGNOSIS — H3530 Unspecified macular degeneration: Secondary | ICD-10-CM

## 2012-11-11 DIAGNOSIS — H35039 Hypertensive retinopathy, unspecified eye: Secondary | ICD-10-CM

## 2012-11-11 DIAGNOSIS — I10 Essential (primary) hypertension: Secondary | ICD-10-CM

## 2012-11-11 DIAGNOSIS — E11319 Type 2 diabetes mellitus with unspecified diabetic retinopathy without macular edema: Secondary | ICD-10-CM

## 2012-11-11 DIAGNOSIS — H353290 Exudative age-related macular degeneration, unspecified eye, stage unspecified: Secondary | ICD-10-CM

## 2012-11-11 DIAGNOSIS — H43819 Vitreous degeneration, unspecified eye: Secondary | ICD-10-CM

## 2012-12-04 ENCOUNTER — Encounter (INDEPENDENT_AMBULATORY_CARE_PROVIDER_SITE_OTHER): Payer: Medicare Other | Admitting: Ophthalmology

## 2012-12-04 DIAGNOSIS — E1139 Type 2 diabetes mellitus with other diabetic ophthalmic complication: Secondary | ICD-10-CM

## 2012-12-04 DIAGNOSIS — H43819 Vitreous degeneration, unspecified eye: Secondary | ICD-10-CM

## 2012-12-04 DIAGNOSIS — H3530 Unspecified macular degeneration: Secondary | ICD-10-CM

## 2012-12-04 DIAGNOSIS — E11319 Type 2 diabetes mellitus with unspecified diabetic retinopathy without macular edema: Secondary | ICD-10-CM

## 2012-12-04 DIAGNOSIS — H35039 Hypertensive retinopathy, unspecified eye: Secondary | ICD-10-CM

## 2012-12-04 DIAGNOSIS — I10 Essential (primary) hypertension: Secondary | ICD-10-CM

## 2012-12-04 DIAGNOSIS — H353290 Exudative age-related macular degeneration, unspecified eye, stage unspecified: Secondary | ICD-10-CM

## 2013-01-02 ENCOUNTER — Encounter (INDEPENDENT_AMBULATORY_CARE_PROVIDER_SITE_OTHER): Payer: Medicare Other | Admitting: Ophthalmology

## 2013-01-02 DIAGNOSIS — H3530 Unspecified macular degeneration: Secondary | ICD-10-CM

## 2013-01-02 DIAGNOSIS — E11319 Type 2 diabetes mellitus with unspecified diabetic retinopathy without macular edema: Secondary | ICD-10-CM

## 2013-01-02 DIAGNOSIS — H35039 Hypertensive retinopathy, unspecified eye: Secondary | ICD-10-CM

## 2013-01-02 DIAGNOSIS — H353290 Exudative age-related macular degeneration, unspecified eye, stage unspecified: Secondary | ICD-10-CM

## 2013-01-02 DIAGNOSIS — E1139 Type 2 diabetes mellitus with other diabetic ophthalmic complication: Secondary | ICD-10-CM

## 2013-01-02 DIAGNOSIS — I10 Essential (primary) hypertension: Secondary | ICD-10-CM

## 2013-01-30 ENCOUNTER — Encounter (INDEPENDENT_AMBULATORY_CARE_PROVIDER_SITE_OTHER): Payer: Medicare Other | Admitting: Ophthalmology

## 2013-01-30 DIAGNOSIS — H353290 Exudative age-related macular degeneration, unspecified eye, stage unspecified: Secondary | ICD-10-CM

## 2013-01-30 DIAGNOSIS — H3530 Unspecified macular degeneration: Secondary | ICD-10-CM

## 2013-01-30 DIAGNOSIS — E11319 Type 2 diabetes mellitus with unspecified diabetic retinopathy without macular edema: Secondary | ICD-10-CM

## 2013-01-30 DIAGNOSIS — I10 Essential (primary) hypertension: Secondary | ICD-10-CM

## 2013-01-30 DIAGNOSIS — H35039 Hypertensive retinopathy, unspecified eye: Secondary | ICD-10-CM

## 2013-01-30 DIAGNOSIS — H43819 Vitreous degeneration, unspecified eye: Secondary | ICD-10-CM

## 2013-02-24 ENCOUNTER — Encounter (INDEPENDENT_AMBULATORY_CARE_PROVIDER_SITE_OTHER): Payer: Medicare Other | Admitting: Ophthalmology

## 2013-02-24 DIAGNOSIS — H353290 Exudative age-related macular degeneration, unspecified eye, stage unspecified: Secondary | ICD-10-CM

## 2013-02-24 DIAGNOSIS — H35039 Hypertensive retinopathy, unspecified eye: Secondary | ICD-10-CM

## 2013-02-24 DIAGNOSIS — H3530 Unspecified macular degeneration: Secondary | ICD-10-CM

## 2013-02-24 DIAGNOSIS — H43819 Vitreous degeneration, unspecified eye: Secondary | ICD-10-CM

## 2013-02-24 DIAGNOSIS — I10 Essential (primary) hypertension: Secondary | ICD-10-CM

## 2013-03-24 ENCOUNTER — Encounter (INDEPENDENT_AMBULATORY_CARE_PROVIDER_SITE_OTHER): Payer: Medicare Other | Admitting: Ophthalmology

## 2013-03-24 DIAGNOSIS — H353290 Exudative age-related macular degeneration, unspecified eye, stage unspecified: Secondary | ICD-10-CM

## 2013-03-24 DIAGNOSIS — H3530 Unspecified macular degeneration: Secondary | ICD-10-CM

## 2013-03-24 DIAGNOSIS — H43819 Vitreous degeneration, unspecified eye: Secondary | ICD-10-CM

## 2013-03-24 DIAGNOSIS — I10 Essential (primary) hypertension: Secondary | ICD-10-CM

## 2013-03-24 DIAGNOSIS — H35039 Hypertensive retinopathy, unspecified eye: Secondary | ICD-10-CM

## 2013-04-23 ENCOUNTER — Encounter (INDEPENDENT_AMBULATORY_CARE_PROVIDER_SITE_OTHER): Payer: Medicare Other | Admitting: Ophthalmology

## 2013-04-23 DIAGNOSIS — H43819 Vitreous degeneration, unspecified eye: Secondary | ICD-10-CM

## 2013-04-23 DIAGNOSIS — H353290 Exudative age-related macular degeneration, unspecified eye, stage unspecified: Secondary | ICD-10-CM

## 2013-04-23 DIAGNOSIS — H3530 Unspecified macular degeneration: Secondary | ICD-10-CM

## 2013-04-23 DIAGNOSIS — I10 Essential (primary) hypertension: Secondary | ICD-10-CM

## 2013-04-23 DIAGNOSIS — H35039 Hypertensive retinopathy, unspecified eye: Secondary | ICD-10-CM

## 2013-05-20 ENCOUNTER — Encounter (INDEPENDENT_AMBULATORY_CARE_PROVIDER_SITE_OTHER): Payer: Medicare Other | Admitting: Ophthalmology

## 2013-05-28 ENCOUNTER — Encounter (INDEPENDENT_AMBULATORY_CARE_PROVIDER_SITE_OTHER): Payer: Medicare Other | Admitting: Ophthalmology

## 2013-05-28 DIAGNOSIS — E1139 Type 2 diabetes mellitus with other diabetic ophthalmic complication: Secondary | ICD-10-CM

## 2013-05-28 DIAGNOSIS — H43819 Vitreous degeneration, unspecified eye: Secondary | ICD-10-CM

## 2013-05-28 DIAGNOSIS — H3530 Unspecified macular degeneration: Secondary | ICD-10-CM

## 2013-05-28 DIAGNOSIS — H353290 Exudative age-related macular degeneration, unspecified eye, stage unspecified: Secondary | ICD-10-CM

## 2013-05-28 DIAGNOSIS — E11319 Type 2 diabetes mellitus with unspecified diabetic retinopathy without macular edema: Secondary | ICD-10-CM

## 2013-05-28 DIAGNOSIS — I10 Essential (primary) hypertension: Secondary | ICD-10-CM

## 2013-05-28 DIAGNOSIS — H35039 Hypertensive retinopathy, unspecified eye: Secondary | ICD-10-CM

## 2013-05-28 DIAGNOSIS — E1165 Type 2 diabetes mellitus with hyperglycemia: Secondary | ICD-10-CM

## 2013-06-25 ENCOUNTER — Encounter (INDEPENDENT_AMBULATORY_CARE_PROVIDER_SITE_OTHER): Payer: Medicare Other | Admitting: Ophthalmology

## 2013-06-25 DIAGNOSIS — I10 Essential (primary) hypertension: Secondary | ICD-10-CM

## 2013-06-25 DIAGNOSIS — E1165 Type 2 diabetes mellitus with hyperglycemia: Secondary | ICD-10-CM

## 2013-06-25 DIAGNOSIS — H3530 Unspecified macular degeneration: Secondary | ICD-10-CM

## 2013-06-25 DIAGNOSIS — E11319 Type 2 diabetes mellitus with unspecified diabetic retinopathy without macular edema: Secondary | ICD-10-CM

## 2013-06-25 DIAGNOSIS — E1139 Type 2 diabetes mellitus with other diabetic ophthalmic complication: Secondary | ICD-10-CM

## 2013-06-25 DIAGNOSIS — H353290 Exudative age-related macular degeneration, unspecified eye, stage unspecified: Secondary | ICD-10-CM

## 2013-06-25 DIAGNOSIS — H43819 Vitreous degeneration, unspecified eye: Secondary | ICD-10-CM

## 2013-06-25 DIAGNOSIS — H35039 Hypertensive retinopathy, unspecified eye: Secondary | ICD-10-CM

## 2013-07-23 ENCOUNTER — Encounter (INDEPENDENT_AMBULATORY_CARE_PROVIDER_SITE_OTHER): Payer: Medicare Other | Admitting: Ophthalmology

## 2013-07-23 DIAGNOSIS — E11319 Type 2 diabetes mellitus with unspecified diabetic retinopathy without macular edema: Secondary | ICD-10-CM

## 2013-07-23 DIAGNOSIS — H35039 Hypertensive retinopathy, unspecified eye: Secondary | ICD-10-CM

## 2013-07-23 DIAGNOSIS — E1165 Type 2 diabetes mellitus with hyperglycemia: Secondary | ICD-10-CM

## 2013-07-23 DIAGNOSIS — H3530 Unspecified macular degeneration: Secondary | ICD-10-CM

## 2013-07-23 DIAGNOSIS — I10 Essential (primary) hypertension: Secondary | ICD-10-CM

## 2013-07-23 DIAGNOSIS — H353290 Exudative age-related macular degeneration, unspecified eye, stage unspecified: Secondary | ICD-10-CM

## 2013-07-23 DIAGNOSIS — H43819 Vitreous degeneration, unspecified eye: Secondary | ICD-10-CM

## 2013-07-23 DIAGNOSIS — E1139 Type 2 diabetes mellitus with other diabetic ophthalmic complication: Secondary | ICD-10-CM

## 2013-08-13 ENCOUNTER — Encounter (INDEPENDENT_AMBULATORY_CARE_PROVIDER_SITE_OTHER): Payer: Medicare Other | Admitting: Ophthalmology

## 2013-08-13 DIAGNOSIS — H353290 Exudative age-related macular degeneration, unspecified eye, stage unspecified: Secondary | ICD-10-CM

## 2013-08-13 DIAGNOSIS — H35039 Hypertensive retinopathy, unspecified eye: Secondary | ICD-10-CM

## 2013-08-13 DIAGNOSIS — I10 Essential (primary) hypertension: Secondary | ICD-10-CM

## 2013-08-13 DIAGNOSIS — E11319 Type 2 diabetes mellitus with unspecified diabetic retinopathy without macular edema: Secondary | ICD-10-CM

## 2013-08-13 DIAGNOSIS — H3530 Unspecified macular degeneration: Secondary | ICD-10-CM

## 2013-08-13 DIAGNOSIS — H43819 Vitreous degeneration, unspecified eye: Secondary | ICD-10-CM

## 2013-08-13 DIAGNOSIS — E1139 Type 2 diabetes mellitus with other diabetic ophthalmic complication: Secondary | ICD-10-CM

## 2013-08-13 DIAGNOSIS — E1165 Type 2 diabetes mellitus with hyperglycemia: Secondary | ICD-10-CM

## 2013-09-11 ENCOUNTER — Encounter (INDEPENDENT_AMBULATORY_CARE_PROVIDER_SITE_OTHER): Payer: Medicare Other | Admitting: Ophthalmology

## 2013-09-11 DIAGNOSIS — H43819 Vitreous degeneration, unspecified eye: Secondary | ICD-10-CM

## 2013-09-11 DIAGNOSIS — H35039 Hypertensive retinopathy, unspecified eye: Secondary | ICD-10-CM

## 2013-09-11 DIAGNOSIS — H353290 Exudative age-related macular degeneration, unspecified eye, stage unspecified: Secondary | ICD-10-CM

## 2013-09-11 DIAGNOSIS — I10 Essential (primary) hypertension: Secondary | ICD-10-CM

## 2013-09-11 DIAGNOSIS — H3530 Unspecified macular degeneration: Secondary | ICD-10-CM

## 2013-10-09 ENCOUNTER — Encounter (INDEPENDENT_AMBULATORY_CARE_PROVIDER_SITE_OTHER): Payer: Medicare Other | Admitting: Ophthalmology

## 2013-10-09 DIAGNOSIS — H3530 Unspecified macular degeneration: Secondary | ICD-10-CM

## 2013-10-09 DIAGNOSIS — H35039 Hypertensive retinopathy, unspecified eye: Secondary | ICD-10-CM

## 2013-10-09 DIAGNOSIS — H43819 Vitreous degeneration, unspecified eye: Secondary | ICD-10-CM

## 2013-10-09 DIAGNOSIS — H353290 Exudative age-related macular degeneration, unspecified eye, stage unspecified: Secondary | ICD-10-CM

## 2013-10-09 DIAGNOSIS — I10 Essential (primary) hypertension: Secondary | ICD-10-CM

## 2013-11-04 ENCOUNTER — Encounter (INDEPENDENT_AMBULATORY_CARE_PROVIDER_SITE_OTHER): Payer: Medicare Other | Admitting: Ophthalmology

## 2013-11-04 DIAGNOSIS — I10 Essential (primary) hypertension: Secondary | ICD-10-CM

## 2013-11-04 DIAGNOSIS — E11319 Type 2 diabetes mellitus with unspecified diabetic retinopathy without macular edema: Secondary | ICD-10-CM

## 2013-11-04 DIAGNOSIS — E1139 Type 2 diabetes mellitus with other diabetic ophthalmic complication: Secondary | ICD-10-CM

## 2013-11-04 DIAGNOSIS — H35039 Hypertensive retinopathy, unspecified eye: Secondary | ICD-10-CM

## 2013-11-04 DIAGNOSIS — H3530 Unspecified macular degeneration: Secondary | ICD-10-CM

## 2013-11-04 DIAGNOSIS — H43819 Vitreous degeneration, unspecified eye: Secondary | ICD-10-CM

## 2013-11-04 DIAGNOSIS — E1165 Type 2 diabetes mellitus with hyperglycemia: Secondary | ICD-10-CM

## 2013-11-04 DIAGNOSIS — H353290 Exudative age-related macular degeneration, unspecified eye, stage unspecified: Secondary | ICD-10-CM

## 2013-12-02 ENCOUNTER — Encounter (INDEPENDENT_AMBULATORY_CARE_PROVIDER_SITE_OTHER): Payer: Medicare Other | Admitting: Ophthalmology

## 2013-12-02 DIAGNOSIS — I10 Essential (primary) hypertension: Secondary | ICD-10-CM

## 2013-12-02 DIAGNOSIS — H3530 Unspecified macular degeneration: Secondary | ICD-10-CM

## 2013-12-02 DIAGNOSIS — E1165 Type 2 diabetes mellitus with hyperglycemia: Secondary | ICD-10-CM

## 2013-12-02 DIAGNOSIS — H35039 Hypertensive retinopathy, unspecified eye: Secondary | ICD-10-CM

## 2013-12-02 DIAGNOSIS — H43819 Vitreous degeneration, unspecified eye: Secondary | ICD-10-CM

## 2013-12-02 DIAGNOSIS — H353290 Exudative age-related macular degeneration, unspecified eye, stage unspecified: Secondary | ICD-10-CM

## 2013-12-02 DIAGNOSIS — E11319 Type 2 diabetes mellitus with unspecified diabetic retinopathy without macular edema: Secondary | ICD-10-CM

## 2013-12-02 DIAGNOSIS — E1139 Type 2 diabetes mellitus with other diabetic ophthalmic complication: Secondary | ICD-10-CM

## 2013-12-30 ENCOUNTER — Encounter (INDEPENDENT_AMBULATORY_CARE_PROVIDER_SITE_OTHER): Payer: Medicare Other | Admitting: Ophthalmology

## 2013-12-30 DIAGNOSIS — H35039 Hypertensive retinopathy, unspecified eye: Secondary | ICD-10-CM

## 2013-12-30 DIAGNOSIS — H353290 Exudative age-related macular degeneration, unspecified eye, stage unspecified: Secondary | ICD-10-CM

## 2013-12-30 DIAGNOSIS — E1165 Type 2 diabetes mellitus with hyperglycemia: Secondary | ICD-10-CM

## 2013-12-30 DIAGNOSIS — I10 Essential (primary) hypertension: Secondary | ICD-10-CM

## 2013-12-30 DIAGNOSIS — E1139 Type 2 diabetes mellitus with other diabetic ophthalmic complication: Secondary | ICD-10-CM

## 2013-12-30 DIAGNOSIS — E11319 Type 2 diabetes mellitus with unspecified diabetic retinopathy without macular edema: Secondary | ICD-10-CM

## 2013-12-30 DIAGNOSIS — H3530 Unspecified macular degeneration: Secondary | ICD-10-CM

## 2014-01-06 ENCOUNTER — Emergency Department (HOSPITAL_BASED_OUTPATIENT_CLINIC_OR_DEPARTMENT_OTHER)
Admission: EM | Admit: 2014-01-06 | Discharge: 2014-01-06 | Disposition: A | Discharge status: Home / Self Care | Payer: Medicare Other | Attending: Emergency Medicine | Admitting: Emergency Medicine

## 2014-01-06 ENCOUNTER — Emergency Department (HOSPITAL_BASED_OUTPATIENT_CLINIC_OR_DEPARTMENT_OTHER): Payer: Medicare Other

## 2014-01-06 ENCOUNTER — Encounter (HOSPITAL_BASED_OUTPATIENT_CLINIC_OR_DEPARTMENT_OTHER): Payer: Self-pay | Admitting: Emergency Medicine

## 2014-01-06 DIAGNOSIS — Z7902 Long term (current) use of antithrombotics/antiplatelets: Secondary | ICD-10-CM | POA: Insufficient documentation

## 2014-01-06 DIAGNOSIS — T07XXXA Unspecified multiple injuries, initial encounter: Secondary | ICD-10-CM

## 2014-01-06 DIAGNOSIS — Z86718 Personal history of other venous thrombosis and embolism: Secondary | ICD-10-CM | POA: Insufficient documentation

## 2014-01-06 DIAGNOSIS — Z79899 Other long term (current) drug therapy: Secondary | ICD-10-CM | POA: Insufficient documentation

## 2014-01-06 DIAGNOSIS — I2510 Atherosclerotic heart disease of native coronary artery without angina pectoris: Secondary | ICD-10-CM | POA: Diagnosis not present

## 2014-01-06 DIAGNOSIS — IMO0002 Reserved for concepts with insufficient information to code with codable children: Secondary | ICD-10-CM | POA: Insufficient documentation

## 2014-01-06 DIAGNOSIS — Y92009 Unspecified place in unspecified non-institutional (private) residence as the place of occurrence of the external cause: Secondary | ICD-10-CM | POA: Insufficient documentation

## 2014-01-06 DIAGNOSIS — I10 Essential (primary) hypertension: Secondary | ICD-10-CM | POA: Insufficient documentation

## 2014-01-06 DIAGNOSIS — S99919A Unspecified injury of unspecified ankle, initial encounter: Secondary | ICD-10-CM | POA: Diagnosis present

## 2014-01-06 DIAGNOSIS — S8990XA Unspecified injury of unspecified lower leg, initial encounter: Secondary | ICD-10-CM | POA: Insufficient documentation

## 2014-01-06 DIAGNOSIS — S99929A Unspecified injury of unspecified foot, initial encounter: Secondary | ICD-10-CM

## 2014-01-06 DIAGNOSIS — M7040 Prepatellar bursitis, unspecified knee: Secondary | ICD-10-CM | POA: Diagnosis not present

## 2014-01-06 DIAGNOSIS — Z87891 Personal history of nicotine dependence: Secondary | ICD-10-CM | POA: Insufficient documentation

## 2014-01-06 DIAGNOSIS — E785 Hyperlipidemia, unspecified: Secondary | ICD-10-CM | POA: Insufficient documentation

## 2014-01-06 DIAGNOSIS — M7041 Prepatellar bursitis, right knee: Secondary | ICD-10-CM

## 2014-01-06 DIAGNOSIS — Z8673 Personal history of transient ischemic attack (TIA), and cerebral infarction without residual deficits: Secondary | ICD-10-CM | POA: Insufficient documentation

## 2014-01-06 DIAGNOSIS — E119 Type 2 diabetes mellitus without complications: Secondary | ICD-10-CM | POA: Diagnosis not present

## 2014-01-06 DIAGNOSIS — Y9389 Activity, other specified: Secondary | ICD-10-CM | POA: Diagnosis not present

## 2014-01-06 NOTE — ED Notes (Signed)
Ace wrap to rt arm to control bleeding per EMT per order MD.

## 2014-01-06 NOTE — ED Notes (Signed)
States he tripped over computer cord at home and fell on to tile floor. Skin tears to left arm and 3rd and 4th finger of right hand. Abrasion to rt knee and swelling noted to knee. Ice pack applied.

## 2014-01-06 NOTE — ED Notes (Signed)
Patient preparing for discharge. 

## 2014-01-06 NOTE — Discharge Instructions (Signed)
Avoid activity until the bursitis is improving. Keep your wounds clean and covered.  Abrasion An abrasion is a cut or scrape of the skin. Abrasions do not extend through all layers of the skin and most heal within 10 days. It is important to care for your abrasion properly to prevent infection. CAUSES  Most abrasions are caused by falling on, or gliding across, the ground or other surface. When your skin rubs on something, the outer and inner layer of skin rubs off, causing an abrasion. DIAGNOSIS  Your caregiver will be able to diagnose an abrasion during a physical exam.  TREATMENT  Your treatment depends on how large and deep the abrasion is. Generally, your abrasion will be cleaned with water and a mild soap to remove any dirt or debris. An antibiotic ointment may be put over the abrasion to prevent an infection. A bandage (dressing) may be wrapped around the abrasion to keep it from getting dirty.  You may need a tetanus shot if:  You cannot remember when you had your last tetanus shot.  You have never had a tetanus shot.  The injury broke your skin. If you get a tetanus shot, your arm may swell, get red, and feel warm to the touch. This is common and not a problem. If you need a tetanus shot and you choose not to have one, there is a rare chance of getting tetanus. Sickness from tetanus can be serious.  HOME CARE INSTRUCTIONS   If a dressing was applied, change it at least once a day or as directed by your caregiver. If the bandage sticks, soak it off with warm water.   Wash the area with water and a mild soap to remove all the ointment 2 times a day. Rinse off the soap and pat the area dry with a clean towel.   Reapply any ointment as directed by your caregiver. This will help prevent infection and keep the bandage from sticking. Use gauze over the wound and under the dressing to help keep the bandage from sticking.   Change your dressing right away if it becomes wet or dirty.    Only take over-the-counter or prescription medicines for pain, discomfort, or fever as directed by your caregiver.   Follow up with your caregiver within 24-48 hours for a wound check, or as directed. If you were not given a wound-check appointment, look closely at your abrasion for redness, swelling, or pus. These are signs of infection. SEEK IMMEDIATE MEDICAL CARE IF:   You have increasing pain in the wound.   You have redness, swelling, or tenderness around the wound.   You have pus coming from the wound.   You have a fever or persistent symptoms for more than 2-3 days.  You have a fever and your symptoms suddenly get worse.  You have a bad smell coming from the wound or dressing.  MAKE SURE YOU:   Understand these instructions.  Will watch your condition.  Will get help right away if you are not doing well or get worse. Document Released: 02/08/2005 Document Revised: 04/17/2012 Document Reviewed: 04/04/2011 [facility] Patient Information 2015 [place], [place]. This information is not intended to replace advice given to you by your health care provider. Make sure you discuss any questions you have with your health care provider.  Bursitis Bursitis is when the fluid-filled sac (bursa) that covers and protects a joint gets puffy and irritated. The elbow, shoulder, hip, and knee joints are most often affected. HOME CARE  Put ice on the area.  Put ice in a plastic bag.  Place a towel between your skin and the bag.  Leave the ice on for 15-20 minutes, 03-04 times a day.  Put the joint through a full range of motion 4 times a day. Rest the injured joint at other times. When you have less pain, begin slow movements and usual activities.  Only take medicine as told by your doctor.  Follow up with your doctor. Any delay in care could stop the bursitis from healing. This could cause long-term pain. GET HELP RIGHT AWAY IF:   You have more pain with treatment.  You have a  temperature by mouth above 102 F (38.9 C), not controlled by medicine.  You have heat and irritation over the fluid-filled sac. MAKE SURE YOU:   Understand these instructions.  Will watch your condition.  Will get help right away if you are not doing well or get worse. Document Released: 10/19/2009 Document Revised: 07/24/2011 Document Reviewed: 07/21/2013 [facility] Patient Information 2015 [place], [place]. This information is not intended to replace advice given to you by your health care provider. Make sure you discuss any questions you have with your health care provider.

## 2014-01-06 NOTE — ED Provider Notes (Signed)
CSN: [id number]     Arrival date & time 01/06/14  1884 History   First MD Initiated Contact with Patient 01/06/14 [phone]     Chief Complaint  Patient presents with  . Fall     HPI  Vision presents after a fall. He was sitting at his computer when the doorbell rang. He got up to answer the door. His right foot became entangled in his computer record and he fell onto all fours. He is some swelling right below his right knee. He has multiple hand and left upper extremity skin tears. No other areas of pain or injury. Did not strike his head. No neck or back pain.  Past Medical History  Diagnosis Date  . Diabetes mellitus   . Coronary artery disease   . Prostate cancer    Past Surgical History  Procedure Laterality Date  . Prostatectomy    . Hip surgery     Family History  Problem Relation Age of Onset  . Hyperlipidemia    . Hypertension    . Diabetes    . Deep vein thrombosis    . Stroke    . Coronary artery disease     History  Substance Use Topics  . Smoking status: Former Smoker -- 0.50 packs/day for 30 years    Quit date: 05/16/1959  . Smokeless tobacco: Not on file  . Alcohol Use: No    Review of Systems  Constitutional: Negative for fever, chills, diaphoresis, appetite change and fatigue.  HENT: Negative for mouth sores, sore throat and trouble swallowing.   Eyes: Negative for visual disturbance.  Respiratory: Negative for cough, chest tightness, shortness of breath and wheezing.   Cardiovascular: Negative for chest pain.  Gastrointestinal: Negative for nausea, vomiting, abdominal pain, diarrhea and abdominal distention.  Endocrine: Negative for polydipsia, polyphagia and polyuria.  Genitourinary: Negative for dysuria, frequency and hematuria.  Musculoskeletal: Negative for gait problem.       Right knee pain. Multiple hand and forearm abrasions  Skin: Negative for color change, pallor and rash.  Neurological: Negative for dizziness, syncope, light-headedness and  headaches.  Hematological: Does not bruise/bleed easily.  Psychiatric/Behavioral: Negative for behavioral problems and confusion.      Allergies  Ivp dye  Home Medications   Prior to Admission medications   Medication Sig Start Date End Date Taking? Authorizing Provider  amLODipine (NORVASC) 5 MG tablet Take 1 tablet (5 mg total) by mouth daily. 04/24/11 01/06/14 Yes Thurnell Lose, MD  clopidogrel (PLAVIX) 75 MG tablet Take 75 mg by mouth daily.   Yes Historical Provider, MD  fish oil-omega-3 fatty acids 1000 MG capsule Take 1 g by mouth every morning.     Yes Historical Provider, MD  isosorbide dinitrate (ISORDIL) 10 MG tablet Take 10 mg by mouth 3 (three) times daily.   Yes Historical Provider, MD  isosorbide mononitrate (IMDUR) 60 MG 24 hr tablet Take 60 mg by mouth every morning.  09/20/10  Yes Thayer Headings, MD  metFORMIN (GLUCOPHAGE) 1000 MG tablet Take 1,000 mg by mouth 2 (two) times daily with a meal.     Yes Historical Provider, MD  Multiple Vitamins-Minerals (MULTIVITAMINS THER. W/MINERALS) TABS Take 1 tablet by mouth every morning.     Yes Historical Provider, MD  nitroGLYCERIN (NITROSTAT) 0.4 MG SL tablet Place 0.4 mg under the tongue every 5 (five) minutes as needed. FOR CHEST PAIN    Yes Historical Provider, MD  rosuvastatin (CRESTOR) 20 MG tablet Take 10 mg by  mouth at bedtime.     Yes Historical Provider, MD  ranolazine (RANEXA) 500 MG 12 hr tablet Take 500 mg by mouth at bedtime.  02/08/11   Thayer Headings, MD   BP 120/62  Pulse 72  Temp(Src) 97.8 F (36.6 C) (Oral)  Resp 18  Ht 5' 11.5" (1.816 m)  Wt 179 lb (81.194 kg)  BMI 24.62 kg/m2  SpO2 98% Physical Exam  Constitutional: He is oriented to person, place, and time. He appears well-developed and well-nourished. No distress.  HENT:  Head: Normocephalic.  Eyes: Conjunctivae are normal. Pupils are equal, round, and reactive to light. No scleral icterus.  Neck: Normal range of motion. Neck supple. No  thyromegaly present.  Cardiovascular: Normal rate and regular rhythm.  Exam reveals no gallop and no friction rub.   No murmur heard. Pulmonary/Chest: Effort normal and breath sounds normal. No respiratory distress. He has no wheezes. He has no rales.  Abdominal: Soft. Bowel sounds are normal. He exhibits no distension. There is no tenderness. There is no rebound.  Musculoskeletal: Normal range of motion.       Arms:      Hands:      Legs: Range of motion of the hands without pain. Large partial thickness skin tear.  Neurological: He is alert and oriented to person, place, and time.  Skin: Skin is warm and dry. No rash noted.  Psychiatric: He has a normal mood and affect. His behavior is normal.    ED Course  Procedures (including critical care time) Labs Review Labs Reviewed - No data to display  Imaging Review Dg Knee Complete 4 Views Right  01/06/2014   CLINICAL DATA:  Patient fell this morning  EXAM: RIGHT KNEE - COMPLETE 4+ VIEW  COMPARISON:  None.  FINDINGS: There is no evidence of fracture, dislocation, or joint effusion. There is no evidence of arthropathy or other focal bone abnormality. Mild prepatellar soft tissue swelling. There is peripheral vascular atherosclerotic disease.  IMPRESSION: No acute osseous injury of the right knee.   Electronically Signed   By: Kathreen Devoid   On: 01/06/2014 10:09     EKG Interpretation None      MDM   Final diagnoses:  Abrasions of multiple sites  Prepatellar bursitis, right    X-ray show normal patella. Otherwise normal imaging. His wounds are tended to and dressed with nonadherent dressings. Plan is basic wound care.    Tanna Furry, MD 01/06/14 1320

## 2014-01-27 ENCOUNTER — Encounter (INDEPENDENT_AMBULATORY_CARE_PROVIDER_SITE_OTHER): Payer: Medicare Other | Admitting: Ophthalmology

## 2014-01-27 DIAGNOSIS — H35039 Hypertensive retinopathy, unspecified eye: Secondary | ICD-10-CM

## 2014-01-27 DIAGNOSIS — E1139 Type 2 diabetes mellitus with other diabetic ophthalmic complication: Secondary | ICD-10-CM

## 2014-01-27 DIAGNOSIS — H3530 Unspecified macular degeneration: Secondary | ICD-10-CM

## 2014-01-27 DIAGNOSIS — E11319 Type 2 diabetes mellitus with unspecified diabetic retinopathy without macular edema: Secondary | ICD-10-CM

## 2014-01-27 DIAGNOSIS — E1165 Type 2 diabetes mellitus with hyperglycemia: Secondary | ICD-10-CM

## 2014-01-27 DIAGNOSIS — I10 Essential (primary) hypertension: Secondary | ICD-10-CM

## 2014-01-27 DIAGNOSIS — H353290 Exudative age-related macular degeneration, unspecified eye, stage unspecified: Secondary | ICD-10-CM

## 2014-02-23 ENCOUNTER — Encounter (INDEPENDENT_AMBULATORY_CARE_PROVIDER_SITE_OTHER): Payer: Medicare Other | Admitting: Ophthalmology

## 2014-02-23 DIAGNOSIS — H35033 Hypertensive retinopathy, bilateral: Secondary | ICD-10-CM

## 2014-02-23 DIAGNOSIS — E11311 Type 2 diabetes mellitus with unspecified diabetic retinopathy with macular edema: Secondary | ICD-10-CM

## 2014-02-23 DIAGNOSIS — I10 Essential (primary) hypertension: Secondary | ICD-10-CM

## 2014-02-23 DIAGNOSIS — H3531 Nonexudative age-related macular degeneration: Secondary | ICD-10-CM

## 2014-02-23 DIAGNOSIS — E11329 Type 2 diabetes mellitus with mild nonproliferative diabetic retinopathy without macular edema: Secondary | ICD-10-CM

## 2014-02-23 DIAGNOSIS — E11331 Type 2 diabetes mellitus with moderate nonproliferative diabetic retinopathy with macular edema: Secondary | ICD-10-CM

## 2014-02-23 DIAGNOSIS — H3532 Exudative age-related macular degeneration: Secondary | ICD-10-CM

## 2014-03-23 ENCOUNTER — Encounter (INDEPENDENT_AMBULATORY_CARE_PROVIDER_SITE_OTHER): Payer: Medicare Other | Admitting: Ophthalmology

## 2014-03-23 DIAGNOSIS — H3531 Nonexudative age-related macular degeneration: Secondary | ICD-10-CM

## 2014-03-23 DIAGNOSIS — H1033 Unspecified acute conjunctivitis, bilateral: Secondary | ICD-10-CM

## 2014-03-23 DIAGNOSIS — H35033 Hypertensive retinopathy, bilateral: Secondary | ICD-10-CM

## 2014-03-23 DIAGNOSIS — E11319 Type 2 diabetes mellitus with unspecified diabetic retinopathy without macular edema: Secondary | ICD-10-CM

## 2014-03-23 DIAGNOSIS — I10 Essential (primary) hypertension: Secondary | ICD-10-CM

## 2014-03-23 DIAGNOSIS — E11329 Type 2 diabetes mellitus with mild nonproliferative diabetic retinopathy without macular edema: Secondary | ICD-10-CM

## 2014-03-23 DIAGNOSIS — H43813 Vitreous degeneration, bilateral: Secondary | ICD-10-CM

## 2014-03-23 DIAGNOSIS — H3532 Exudative age-related macular degeneration: Secondary | ICD-10-CM

## 2014-04-02 ENCOUNTER — Encounter (INDEPENDENT_AMBULATORY_CARE_PROVIDER_SITE_OTHER): Payer: Medicare Other | Admitting: Ophthalmology

## 2014-04-02 DIAGNOSIS — E11329 Type 2 diabetes mellitus with mild nonproliferative diabetic retinopathy without macular edema: Secondary | ICD-10-CM

## 2014-04-02 DIAGNOSIS — I10 Essential (primary) hypertension: Secondary | ICD-10-CM

## 2014-04-02 DIAGNOSIS — H3532 Exudative age-related macular degeneration: Secondary | ICD-10-CM

## 2014-04-02 DIAGNOSIS — H43813 Vitreous degeneration, bilateral: Secondary | ICD-10-CM

## 2014-04-02 DIAGNOSIS — H3531 Nonexudative age-related macular degeneration: Secondary | ICD-10-CM

## 2014-04-02 DIAGNOSIS — E11319 Type 2 diabetes mellitus with unspecified diabetic retinopathy without macular edema: Secondary | ICD-10-CM

## 2014-04-02 DIAGNOSIS — H35033 Hypertensive retinopathy, bilateral: Secondary | ICD-10-CM

## 2014-04-30 ENCOUNTER — Encounter (INDEPENDENT_AMBULATORY_CARE_PROVIDER_SITE_OTHER): Payer: Medicare Other | Admitting: Ophthalmology

## 2014-04-30 DIAGNOSIS — H43813 Vitreous degeneration, bilateral: Secondary | ICD-10-CM

## 2014-04-30 DIAGNOSIS — H35033 Hypertensive retinopathy, bilateral: Secondary | ICD-10-CM

## 2014-04-30 DIAGNOSIS — E11329 Type 2 diabetes mellitus with mild nonproliferative diabetic retinopathy without macular edema: Secondary | ICD-10-CM

## 2014-04-30 DIAGNOSIS — H3532 Exudative age-related macular degeneration: Secondary | ICD-10-CM

## 2014-04-30 DIAGNOSIS — E11319 Type 2 diabetes mellitus with unspecified diabetic retinopathy without macular edema: Secondary | ICD-10-CM

## 2014-04-30 DIAGNOSIS — I10 Essential (primary) hypertension: Secondary | ICD-10-CM

## 2014-04-30 DIAGNOSIS — H3531 Nonexudative age-related macular degeneration: Secondary | ICD-10-CM

## 2014-05-28 ENCOUNTER — Encounter (INDEPENDENT_AMBULATORY_CARE_PROVIDER_SITE_OTHER): Payer: Medicare Other | Admitting: Ophthalmology

## 2014-05-28 DIAGNOSIS — H43813 Vitreous degeneration, bilateral: Secondary | ICD-10-CM

## 2014-05-28 DIAGNOSIS — E11329 Type 2 diabetes mellitus with mild nonproliferative diabetic retinopathy without macular edema: Secondary | ICD-10-CM

## 2014-05-28 DIAGNOSIS — H3531 Nonexudative age-related macular degeneration: Secondary | ICD-10-CM

## 2014-05-28 DIAGNOSIS — H35033 Hypertensive retinopathy, bilateral: Secondary | ICD-10-CM

## 2014-05-28 DIAGNOSIS — I10 Essential (primary) hypertension: Secondary | ICD-10-CM

## 2014-05-28 DIAGNOSIS — H3532 Exudative age-related macular degeneration: Secondary | ICD-10-CM

## 2014-05-28 DIAGNOSIS — E11319 Type 2 diabetes mellitus with unspecified diabetic retinopathy without macular edema: Secondary | ICD-10-CM

## 2014-06-25 ENCOUNTER — Encounter (INDEPENDENT_AMBULATORY_CARE_PROVIDER_SITE_OTHER): Payer: Medicare Other | Admitting: Ophthalmology

## 2014-06-25 DIAGNOSIS — H3532 Exudative age-related macular degeneration: Secondary | ICD-10-CM

## 2014-06-25 DIAGNOSIS — E11319 Type 2 diabetes mellitus with unspecified diabetic retinopathy without macular edema: Secondary | ICD-10-CM | POA: Diagnosis not present

## 2014-06-25 DIAGNOSIS — E11329 Type 2 diabetes mellitus with mild nonproliferative diabetic retinopathy without macular edema: Secondary | ICD-10-CM

## 2014-06-25 DIAGNOSIS — H43813 Vitreous degeneration, bilateral: Secondary | ICD-10-CM | POA: Diagnosis not present

## 2014-07-23 ENCOUNTER — Encounter (INDEPENDENT_AMBULATORY_CARE_PROVIDER_SITE_OTHER): Payer: Medicare Other | Admitting: Ophthalmology

## 2014-07-23 DIAGNOSIS — H3531 Nonexudative age-related macular degeneration: Secondary | ICD-10-CM

## 2014-07-23 DIAGNOSIS — H3532 Exudative age-related macular degeneration: Secondary | ICD-10-CM | POA: Diagnosis not present

## 2014-07-23 DIAGNOSIS — E11329 Type 2 diabetes mellitus with mild nonproliferative diabetic retinopathy without macular edema: Secondary | ICD-10-CM

## 2014-07-23 DIAGNOSIS — H43823 Vitreomacular adhesion, bilateral: Secondary | ICD-10-CM

## 2014-07-23 DIAGNOSIS — H35033 Hypertensive retinopathy, bilateral: Secondary | ICD-10-CM | POA: Diagnosis not present

## 2014-07-23 DIAGNOSIS — E11319 Type 2 diabetes mellitus with unspecified diabetic retinopathy without macular edema: Secondary | ICD-10-CM

## 2014-08-26 ENCOUNTER — Encounter (INDEPENDENT_AMBULATORY_CARE_PROVIDER_SITE_OTHER): Payer: Medicare Other | Admitting: Ophthalmology

## 2014-08-26 DIAGNOSIS — E11329 Type 2 diabetes mellitus with mild nonproliferative diabetic retinopathy without macular edema: Secondary | ICD-10-CM

## 2014-08-26 DIAGNOSIS — H3531 Nonexudative age-related macular degeneration: Secondary | ICD-10-CM

## 2014-08-26 DIAGNOSIS — H3532 Exudative age-related macular degeneration: Secondary | ICD-10-CM

## 2014-08-26 DIAGNOSIS — H35033 Hypertensive retinopathy, bilateral: Secondary | ICD-10-CM | POA: Diagnosis not present

## 2014-08-26 DIAGNOSIS — I10 Essential (primary) hypertension: Secondary | ICD-10-CM

## 2014-08-26 DIAGNOSIS — H43813 Vitreous degeneration, bilateral: Secondary | ICD-10-CM | POA: Diagnosis not present

## 2014-08-26 DIAGNOSIS — E11319 Type 2 diabetes mellitus with unspecified diabetic retinopathy without macular edema: Secondary | ICD-10-CM | POA: Diagnosis not present

## 2014-09-23 ENCOUNTER — Encounter (INDEPENDENT_AMBULATORY_CARE_PROVIDER_SITE_OTHER): Payer: Medicare Other | Admitting: Ophthalmology

## 2014-09-23 DIAGNOSIS — H35033 Hypertensive retinopathy, bilateral: Secondary | ICD-10-CM | POA: Diagnosis not present

## 2014-09-23 DIAGNOSIS — H43813 Vitreous degeneration, bilateral: Secondary | ICD-10-CM | POA: Diagnosis not present

## 2014-09-23 DIAGNOSIS — E11319 Type 2 diabetes mellitus with unspecified diabetic retinopathy without macular edema: Secondary | ICD-10-CM | POA: Diagnosis not present

## 2014-09-23 DIAGNOSIS — H3532 Exudative age-related macular degeneration: Secondary | ICD-10-CM

## 2014-09-23 DIAGNOSIS — H3531 Nonexudative age-related macular degeneration: Secondary | ICD-10-CM | POA: Diagnosis not present

## 2014-09-23 DIAGNOSIS — I10 Essential (primary) hypertension: Secondary | ICD-10-CM | POA: Diagnosis not present

## 2014-09-23 DIAGNOSIS — E11329 Type 2 diabetes mellitus with mild nonproliferative diabetic retinopathy without macular edema: Secondary | ICD-10-CM | POA: Diagnosis not present

## 2014-10-16 ENCOUNTER — Encounter (HOSPITAL_BASED_OUTPATIENT_CLINIC_OR_DEPARTMENT_OTHER): Payer: Self-pay | Admitting: *Deleted

## 2014-10-16 ENCOUNTER — Emergency Department (HOSPITAL_BASED_OUTPATIENT_CLINIC_OR_DEPARTMENT_OTHER): Payer: Medicare Other

## 2014-10-16 ENCOUNTER — Emergency Department (HOSPITAL_BASED_OUTPATIENT_CLINIC_OR_DEPARTMENT_OTHER)
Admission: EM | Admit: 2014-10-16 | Discharge: 2014-10-16 | Disposition: A | Discharge status: Home / Self Care | Payer: Medicare Other | Attending: Emergency Medicine | Admitting: Emergency Medicine

## 2014-10-16 DIAGNOSIS — Z8546 Personal history of malignant neoplasm of prostate: Secondary | ICD-10-CM | POA: Diagnosis not present

## 2014-10-16 DIAGNOSIS — Z7902 Long term (current) use of antithrombotics/antiplatelets: Secondary | ICD-10-CM | POA: Insufficient documentation

## 2014-10-16 DIAGNOSIS — E119 Type 2 diabetes mellitus without complications: Secondary | ICD-10-CM | POA: Diagnosis not present

## 2014-10-16 DIAGNOSIS — R1032 Left lower quadrant pain: Secondary | ICD-10-CM | POA: Diagnosis present

## 2014-10-16 DIAGNOSIS — Z79899 Other long term (current) drug therapy: Secondary | ICD-10-CM | POA: Diagnosis not present

## 2014-10-16 DIAGNOSIS — I2510 Atherosclerotic heart disease of native coronary artery without angina pectoris: Secondary | ICD-10-CM | POA: Insufficient documentation

## 2014-10-16 DIAGNOSIS — K4090 Unilateral inguinal hernia, without obstruction or gangrene, not specified as recurrent: Secondary | ICD-10-CM | POA: Diagnosis not present

## 2014-10-16 DIAGNOSIS — Z87891 Personal history of nicotine dependence: Secondary | ICD-10-CM | POA: Diagnosis not present

## 2014-10-16 LAB — URINALYSIS, ROUTINE W REFLEX MICROSCOPIC
Bilirubin Urine: NEGATIVE
GLUCOSE, UA: NEGATIVE mg/dL
Hgb urine dipstick: NEGATIVE
KETONES UR: NEGATIVE mg/dL
LEUKOCYTES UA: NEGATIVE
Nitrite: NEGATIVE
Protein, ur: NEGATIVE mg/dL
SPECIFIC GRAVITY, URINE: 1.025 (ref 1.005–1.030)
Urobilinogen, UA: 0.2 mg/dL (ref 0.0–1.0)
pH: 6 (ref 5.0–8.0)

## 2014-10-16 MED ORDER — HYDROCODONE-ACETAMINOPHEN 5-325 MG PO TABS: 1.0000 | ORAL_TABLET | ORAL | Status: DC | PRN

## 2014-10-16 NOTE — ED Provider Notes (Signed)
CSN: [id number]     Arrival date & time 10/16/14  1754 History  This chart was scribed for Garrett Speak, MD by Delphia Grates, ED Scribe. This patient was seen in room MH06/MH06 and the patient's care was started at 6:25 PM.   Chief Complaint  Patient presents with  . Groin Pain    The history is provided by the patient. No language interpreter was used.     HPI Comments: Garrett Henson is a 81 y.o. male who presents to the Emergency Department complaining of constant, waxing and waning, moderate left groin pain since yesterday. Patient suspects his pain is related to a possible hernia, does not feel any lumps or knots. He states the pain is exacerbated with movement and ambulation. He reports history of an inguinal hernia, but states this was several years ago. Patient has past medical history of DM, CAD, and prostate cancer. He also reports past surgical history of prostatectomy. He denies any urinary symptoms and reports normal BMs.   Past Medical History  Diagnosis Date  . Diabetes mellitus   . Coronary artery disease   . Prostate cancer    Past Surgical History  Procedure Laterality Date  . Prostatectomy    . Hip surgery     Family History  Problem Relation Age of Onset  . Hyperlipidemia    . Hypertension    . Diabetes    . Deep vein thrombosis    . Stroke    . Coronary artery disease     History  Substance Use Topics  . Smoking status: Former Smoker -- 0.50 packs/day for 30 years    Quit date: 05/16/1959  . Smokeless tobacco: Not on file  . Alcohol Use: No    Review of Systems  A complete 10 system review of systems was obtained and all systems are negative except as noted in the HPI and PMH.    Allergies  Ivp dye  Home Medications   Prior to Admission medications   Medication Sig Start Date End Date Taking? Authorizing Provider  amLODipine (NORVASC) 5 MG tablet Take 1 tablet (5 mg total) by mouth daily. 04/24/11 01/06/14  Thurnell Lose, MD   clopidogrel (PLAVIX) 75 MG tablet Take 75 mg by mouth daily.    Historical Provider, MD  fish oil-omega-3 fatty acids 1000 MG capsule Take 1 g by mouth every morning.      Historical Provider, MD  isosorbide dinitrate (ISORDIL) 10 MG tablet Take 10 mg by mouth 3 (three) times daily.    Historical Provider, MD  isosorbide mononitrate (IMDUR) 60 MG 24 hr tablet Take 60 mg by mouth every morning.  09/20/10   Thayer Headings, MD  metFORMIN (GLUCOPHAGE) 1000 MG tablet Take 1,000 mg by mouth 2 (two) times daily with a meal.      Historical Provider, MD  Multiple Vitamins-Minerals (MULTIVITAMINS THER. W/MINERALS) TABS Take 1 tablet by mouth every morning.      Historical Provider, MD  nitroGLYCERIN (NITROSTAT) 0.4 MG SL tablet Place 0.4 mg under the tongue every 5 (five) minutes as needed. FOR CHEST PAIN     Historical Provider, MD  ranolazine (RANEXA) 500 MG 12 hr tablet Take 500 mg by mouth at bedtime.  02/08/11   Thayer Headings, MD  rosuvastatin (CRESTOR) 20 MG tablet Take 10 mg by mouth at bedtime.      Historical Provider, MD   Triage Vitals: BP 151/62 mmHg  Pulse 68  Temp(Src) 98 F (36.7 C) (  Oral)  Resp 20  Ht '5\' 11"'$  (1.803 m)  Wt 172 lb (78.019 kg)  BMI 24.00 kg/m2  SpO2 99%  Physical Exam  Constitutional: He is oriented to person, place, and time. He appears well-developed and well-nourished. No distress.  HENT:  Head: Normocephalic and atraumatic.  Eyes: Conjunctivae and EOM are normal.  Neck: Neck supple. No tracheal deviation present.  Cardiovascular: Normal rate.   Pulmonary/Chest: Effort normal. No respiratory distress.  Abdominal: Soft. Bowel sounds are normal. He exhibits no distension. There is no tenderness.  Tenderness to palpation in the left groin.  There is no palpable hernia or defect with valsalva. Pain is worsened with change in position/sitting upright.  Musculoskeletal: Normal range of motion.  Neurological: He is alert and oriented to person, place, and time.   Skin: Skin is warm and dry.  Psychiatric: He has a normal mood and affect. His behavior is normal.  Nursing note and vitals reviewed.   ED Course  Procedures (including critical care time)  DIAGNOSTIC STUDIES: Oxygen Saturation is 99% on room air, normal by my interpretation.    COORDINATION OF CARE: At 2130 Discussed treatment plan with patient which includes imaging. Patient agrees.   Labs Review Labs Reviewed  URINALYSIS, ROUTINE W REFLEX MICROSCOPIC (NOT AT [facility])    Imaging Review Ct Abdomen Pelvis Wo Contrast  10/16/2014   CLINICAL DATA:  Left groin pain pain after heavy lifting.  EXAM: CT ABDOMEN AND PELVIS WITHOUT CONTRAST  TECHNIQUE: Multidetector CT imaging of the abdomen and pelvis was performed following the standard protocol without IV contrast.  COMPARISON:  None.  FINDINGS: Lower chest: 13 mm nodule in right lower lobe (image 1) incompletely imaged.  Hepatobiliary: No focal hepatic lesions noncontrast exam. Gallstones a collapsed gallbladder.  Pancreas: Pancreas is normal. No ductal dilatation. No pancreatic inflammation.  Spleen: Normal spleen  Adrenals/urinary tract: Adrenal glands and kidneys are normal. The ureters and bladder normal.  Stomach/Bowel: Stomach, small bowel, appendix, and cecum are normal. The colon and rectosigmoid colon are normal.  Vascular/Lymphatic: Abdominal aorta is normal caliber with atherosclerotic calcification. There is no retroperitoneal or periportal lymphadenopathy. No pelvic lymphadenopathy.  Reproductive: Prostate gland is normal. There is streak artifact through pelvis from the bilateral hip prosthetics. No pelvic lymphadenopathy.  Musculoskeletal: Aggressive osseous lesion. There is a chronic endplate compression fracture at T12.  Other: Fat filled left inguinal hernia.  IMPRESSION: 1. Small left inguinal hernia. 2. Nodule at the right lung base. Differential includes focus of infection or neoplasm. Recommend CT of the thorax fro further  evaluation.   Electronically Signed   By: Suzy Bouchard M.D.   On: 10/16/2014 19:14     EKG Interpretation None      MDM   Final diagnoses:  None    CT scan confirms an inguinal hernia with no evidence for incarceration, strangulation, or obstruction. His urinalysis is clear. He will be discharged to home with follow-up with [facility] to discuss repair should his discomfort persist. Hand or stands to return if his symptoms worsen or change.  He has also been informed that his CT scan reveals a nodule at the right lung base that will require outpatient CT scan for further evaluation. He is to follow-up with his primary Dr. for this.  I personally performed the services described in this documentation, which was scribed in my presence. The recorded information has been reviewed and is accurate.      Garrett Speak, MD 10/16/14 [phone]

## 2014-10-16 NOTE — ED Notes (Signed)
MD at bedside. 

## 2014-10-16 NOTE — ED Notes (Signed)
Pain in his left groin since yesterday. He does not feel a hernia or knot.

## 2014-10-16 NOTE — ED Notes (Signed)
Wheelchair offered pt declined

## 2014-10-16 NOTE — Discharge Instructions (Signed)
Hydrocodone as prescribed as needed for pain.  Return to the emergency department if you develop worsening pain, high fever, vomiting, or other new and concerning symptoms.  Follow-up with [facility] [place] [facility]. Call Monday to arrange this appointment.  You also have an abnormality on your CT scan which will require a dedicated CT scan of your chest. Follow-up with your primary Dr. to make these arrangements.   Inguinal Hernia, Adult Muscles help keep everything in the body in its proper place. But if a weak spot in the muscles develops, something can poke through. That is called a hernia. When this happens in the lower part of the belly (abdomen), it is called an inguinal hernia. (It takes its name from a part of the body in this region called the inguinal canal.) A weak spot in the wall of muscles lets some fat or part of the small intestine bulge through. An inguinal hernia can develop at any age. Men get them more often than women. CAUSES  In adults, an inguinal hernia develops over time.  It can be triggered by:  Suddenly straining the muscles of the lower abdomen.  Lifting heavy objects.  Straining to have a bowel movement. Difficult bowel movements (constipation) can lead to this.  Constant coughing. This may be caused by smoking or lung disease.  Being overweight.  Being pregnant.  Working at a job that requires long periods of standing or heavy lifting.  Having had an inguinal hernia before. One type can be an emergency situation. It is called a strangulated inguinal hernia. It develops if part of the small intestine slips through the weak spot and cannot get back into the abdomen. The blood supply can be cut off. If that happens, part of the intestine may die. This situation requires emergency surgery. SYMPTOMS  Often, a small inguinal hernia has no symptoms. It is found when a healthcare provider does a physical exam. Larger hernias usually have symptoms.   In adults,  symptoms may include:  A lump in the groin. This is easier to see when the person is standing. It might disappear when lying down.  In men, a lump in the scrotum.  Pain or burning in the groin. This occurs especially when lifting, straining or coughing.  A dull ache or feeling of pressure in the groin.  Signs of a strangulated hernia can include:  A bulge in the groin that becomes very painful and tender to the touch.  A bulge that turns red or purple.  Fever, nausea and vomiting.  Inability to have a bowel movement or to pass gas. DIAGNOSIS  To decide if you have an inguinal hernia, a healthcare provider will probably do a physical examination.  This will include asking questions about any symptoms you have noticed.  The healthcare provider might feel the groin area and ask you to cough. If an inguinal hernia is felt, the healthcare provider may try to slide it back into the abdomen.  Usually no other tests are needed. TREATMENT  Treatments can vary. The size of the hernia makes a difference. Options include:  Watchful waiting. This is often suggested if the hernia is small and you have had no symptoms.  No medical procedure will be done unless symptoms develop.  You will need to watch closely for symptoms. If any occur, contact your healthcare provider right away.  Surgery. This is used if the hernia is larger or you have symptoms.  Open surgery. This is usually an outpatient procedure (you  will not stay overnight in a hospital). An cut (incision) is made through the skin in the groin. The hernia is put back inside the abdomen. The weak area in the muscles is then repaired by herniorrhaphy or hernioplasty. Herniorrhaphy: in this type of surgery, the weak muscles are sewn back together. Hernioplasty: a patch or mesh is used to close the weak area in the abdominal wall.  Laparoscopy. In this procedure, a surgeon makes small incisions. A thin tube with a tiny video camera  (called a laparoscope) is put into the abdomen. The surgeon repairs the hernia with mesh by looking with the video camera and using two long instruments. HOME CARE INSTRUCTIONS   After surgery to repair an inguinal hernia:  You will need to take pain medicine prescribed by your healthcare provider. Follow all directions carefully.  You will need to take care of the wound from the incision.  Your activity will be restricted for awhile. This will probably include no heavy lifting for several weeks. You also should not do anything too active for a few weeks. When you can return to work will depend on the type of job that you have.  During "watchful waiting" periods, you should:  Maintain a healthy weight.  Eat a diet high in fiber (fruits, vegetables and whole grains).  Drink plenty of fluids to avoid constipation. This means drinking enough water and other liquids to keep your urine clear or pale yellow.  Do not lift heavy objects.  Do not stand for long periods of time.  Quit smoking. This should keep you from developing a frequent cough. SEEK MEDICAL CARE IF:   A bulge develops in your groin area.  You feel pain, a burning sensation or pressure in the groin. This might be worse if you are lifting or straining.  You develop a fever of more than 100.5 F (38.1 C). SEEK IMMEDIATE MEDICAL CARE IF:   Pain in the groin increases suddenly.  A bulge in the groin gets bigger suddenly and does not go down.  For men, there is sudden pain in the scrotum. Or, the size of the scrotum increases.  A bulge in the groin area becomes red or purple and is painful to touch.  You have nausea or vomiting that does not go away.  You feel your heart beating much faster than normal.  You cannot have a bowel movement or pass gas.  You develop a fever of more than 102.0 F (38.9 C). Document Released: 09/17/2008 Document Revised: 07/24/2011 Document Reviewed: 09/17/2008 [facility] Patient  Information 2015 [place], [place]. This information is not intended to replace advice given to you by your health care provider. Make sure you discuss any questions you have with your health care provider.

## 2014-10-21 ENCOUNTER — Encounter (INDEPENDENT_AMBULATORY_CARE_PROVIDER_SITE_OTHER): Payer: Medicare Other | Admitting: Ophthalmology

## 2014-10-21 DIAGNOSIS — H43813 Vitreous degeneration, bilateral: Secondary | ICD-10-CM

## 2014-10-21 DIAGNOSIS — I10 Essential (primary) hypertension: Secondary | ICD-10-CM | POA: Diagnosis not present

## 2014-10-21 DIAGNOSIS — H3531 Nonexudative age-related macular degeneration: Secondary | ICD-10-CM | POA: Diagnosis not present

## 2014-10-21 DIAGNOSIS — E11329 Type 2 diabetes mellitus with mild nonproliferative diabetic retinopathy without macular edema: Secondary | ICD-10-CM

## 2014-10-21 DIAGNOSIS — H3532 Exudative age-related macular degeneration: Secondary | ICD-10-CM

## 2014-10-21 DIAGNOSIS — H35033 Hypertensive retinopathy, bilateral: Secondary | ICD-10-CM | POA: Diagnosis not present

## 2014-10-21 DIAGNOSIS — E11319 Type 2 diabetes mellitus with unspecified diabetic retinopathy without macular edema: Secondary | ICD-10-CM

## 2014-10-26 ENCOUNTER — Other Ambulatory Visit: Payer: Self-pay | Admitting: Internal Medicine

## 2014-10-26 ENCOUNTER — Ambulatory Visit
Admission: RE | Admit: 2014-10-26 | Discharge: 2014-10-26 | Disposition: A | Discharge status: Home / Self Care | Payer: Medicare Other | Source: Ambulatory Visit | Attending: Internal Medicine | Admitting: Internal Medicine

## 2014-10-26 DIAGNOSIS — R059 Cough, unspecified: Secondary | ICD-10-CM

## 2014-10-26 DIAGNOSIS — R05 Cough: Secondary | ICD-10-CM

## 2014-10-27 ENCOUNTER — Other Ambulatory Visit: Payer: Self-pay | Admitting: Internal Medicine

## 2014-10-27 DIAGNOSIS — R911 Solitary pulmonary nodule: Secondary | ICD-10-CM

## 2014-10-30 ENCOUNTER — Other Ambulatory Visit: Payer: Self-pay | Admitting: Internal Medicine

## 2014-10-30 ENCOUNTER — Ambulatory Visit
Admission: RE | Admit: 2014-10-30 | Discharge: 2014-10-30 | Disposition: A | Discharge status: Home / Self Care | Payer: Medicare Other | Source: Ambulatory Visit | Attending: Internal Medicine | Admitting: Internal Medicine

## 2014-10-30 DIAGNOSIS — R911 Solitary pulmonary nodule: Secondary | ICD-10-CM

## 2014-11-12 ENCOUNTER — Telehealth: Payer: Self-pay | Admitting: Internal Medicine

## 2014-11-12 NOTE — Telephone Encounter (Signed)
new patient appt-s/w patient and gave np appt for 07/19 @ 1:45 w/Dr. Julien Nordmann

## 2014-11-13 ENCOUNTER — Encounter (INDEPENDENT_AMBULATORY_CARE_PROVIDER_SITE_OTHER): Payer: Medicare Other | Admitting: Ophthalmology

## 2014-11-13 DIAGNOSIS — E11319 Type 2 diabetes mellitus with unspecified diabetic retinopathy without macular edema: Secondary | ICD-10-CM

## 2014-11-13 DIAGNOSIS — H35033 Hypertensive retinopathy, bilateral: Secondary | ICD-10-CM | POA: Diagnosis not present

## 2014-11-13 DIAGNOSIS — E11329 Type 2 diabetes mellitus with mild nonproliferative diabetic retinopathy without macular edema: Secondary | ICD-10-CM

## 2014-11-13 DIAGNOSIS — H3531 Nonexudative age-related macular degeneration: Secondary | ICD-10-CM

## 2014-11-13 DIAGNOSIS — I10 Essential (primary) hypertension: Secondary | ICD-10-CM | POA: Diagnosis not present

## 2014-11-13 DIAGNOSIS — H3532 Exudative age-related macular degeneration: Secondary | ICD-10-CM | POA: Diagnosis not present

## 2014-11-30 ENCOUNTER — Other Ambulatory Visit: Payer: Self-pay | Admitting: Medical Oncology

## 2014-11-30 DIAGNOSIS — C3490 Malignant neoplasm of unspecified part of unspecified bronchus or lung: Secondary | ICD-10-CM

## 2014-12-01 ENCOUNTER — Ambulatory Visit (HOSPITAL_BASED_OUTPATIENT_CLINIC_OR_DEPARTMENT_OTHER): Payer: Medicare Other | Admitting: Internal Medicine

## 2014-12-01 ENCOUNTER — Encounter: Payer: Self-pay | Admitting: Internal Medicine

## 2014-12-01 ENCOUNTER — Ambulatory Visit: Payer: Medicare Other

## 2014-12-01 ENCOUNTER — Other Ambulatory Visit (HOSPITAL_BASED_OUTPATIENT_CLINIC_OR_DEPARTMENT_OTHER): Payer: Medicare Other

## 2014-12-01 VITALS — BP 153/76 | HR 72 | Temp 98.3°F | Resp 18 | Ht 71.0 in | Wt 179.2 lb

## 2014-12-01 DIAGNOSIS — R918 Other nonspecific abnormal finding of lung field: Secondary | ICD-10-CM

## 2014-12-01 DIAGNOSIS — C3490 Malignant neoplasm of unspecified part of unspecified bronchus or lung: Secondary | ICD-10-CM

## 2014-12-01 HISTORY — DX: Other nonspecific abnormal finding of lung field: R91.8

## 2014-12-01 LAB — COMPREHENSIVE METABOLIC PANEL (CC13)
ALBUMIN: 3.9 g/dL (ref 3.5–5.0)
ALT: 16 U/L (ref 0–55)
ANION GAP: 9 meq/L (ref 3–11)
AST: 21 U/L (ref 5–34)
Alkaline Phosphatase: 100 U/L (ref 40–150)
BUN: 16 mg/dL (ref 7.0–26.0)
CO2: 26 mEq/L (ref 22–29)
Calcium: 9.7 mg/dL (ref 8.4–10.4)
Chloride: 103 mEq/L (ref 98–109)
Creatinine: 0.9 mg/dL (ref 0.7–1.3)
EGFR: 74 mL/min/{1.73_m2} — AB (ref >90)
Glucose: 184 mg/dl — ABNORMAL HIGH (ref 70–140)
POTASSIUM: 4.4 meq/L (ref 3.5–5.1)
Sodium: 137 mEq/L (ref 136–145)
TOTAL PROTEIN: 7.1 g/dL (ref 6.4–8.3)
Total Bilirubin: 0.3 mg/dL (ref 0.20–1.20)

## 2014-12-01 LAB — CBC WITH DIFFERENTIAL/PLATELET
BASO%: 0.9 % (ref 0.0–2.0)
BASOS ABS: 0.1 10*3/uL (ref 0.0–0.1)
EOS ABS: 0.4 10*3/uL (ref 0.0–0.5)
EOS%: 5.7 % (ref 0.0–7.0)
HEMATOCRIT: 36.6 % — AB (ref 38.4–49.9)
HGB: 12 g/dL — ABNORMAL LOW (ref 13.0–17.1)
LYMPH#: 1.7 10*3/uL (ref 0.9–3.3)
LYMPH%: 26.3 % (ref 14.0–49.0)
MCH: 26.5 pg — ABNORMAL LOW (ref 27.2–33.4)
MCHC: 32.9 g/dL (ref 32.0–36.0)
MCV: 80.6 fL (ref 79.3–98.0)
MONO#: 0.5 10*3/uL (ref 0.1–0.9)
MONO%: 7.9 % (ref 0.0–14.0)
NEUT%: 59.2 % (ref 39.0–75.0)
NEUTROS ABS: 3.9 10*3/uL (ref 1.5–6.5)
Platelets: 249 10*3/uL (ref 140–400)
RBC: 4.54 10*6/uL (ref 4.20–5.82)
RDW: 14.1 % (ref 11.0–14.6)
WBC: 6.6 10*3/uL (ref 4.0–10.3)

## 2014-12-01 NOTE — Progress Notes (Signed)
[facility] Telephone:([phone]   Fax:([phone]  CONSULT NOTE  REFERRING PHYSICIAN: Dr. Lorene Dy  REASON FOR CONSULTATION:  81 years old white male with questionable lung cancer.  HPI Garrett Henson is a 81 y.o. male with past medical history significant for hypertension, diabetes mellitus, coronary artery disease and stroke. The patient also has a remote history of smoking but quit in 1961. The patient mentions that 6 weeks ago he was doing some yard work and fell to left groin pain after some heavy lifting. He presented to the emergency department at the [facility] of the abdomen and pelvis were performed on 10/16/2014 and it showed small left inguinal hernia but there was also a nodule measuring 1.3 cm in the right lower lobe of the lung. He was seen by his primary care physician Dr. Mancel Bale and chest x-ray performed on 10/26/2014 showed right lower lobe nodule increased in size from 2012. This was followed by CT scan of the chest on 10/30/2014 and it showed 2.0 x 2.0 cm spiculated nodule in the posterior superior aspect of the right lower lobe consistent with carcinoma of the lung. There was also multiple tiny nodules in the left lung that were indeterminate.  Dr. Mancel Bale kindly referred the patient to me today for evaluation and recommendation regarding these abnormalities in his chest. When seen today the patient is feeling fine with no specific complaints. He denied having any significant weight loss or night sweats. He has no chest pain, shortness breath, cough or hemoptysis. The patient denied having any significant nausea or vomiting, no fever or chills. He has no headache or visual changes. Family history significant for brother who died from sarcoma and daughter had colon cancer at age 6. The patient is married and has 6 children. He was accompanied today by his wife Vickii Chafe. 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 He has remote history of smoking for a few years but quit in 1961. He has no history of alcohol or drug abuse.  HPI  Past Medical History  Diagnosis Date  . Diabetes mellitus   . Coronary artery disease   . Prostate cancer   . Lung mass 12/01/2014  . Lung mass 12/01/2014    Past Surgical History  Procedure Laterality Date  . Prostatectomy    . Hip surgery      Family History  Problem Relation Age of Onset  . Hyperlipidemia    . Hypertension    . Diabetes    . Deep vein thrombosis    . Stroke    . Coronary artery disease      Social History History  Substance Use Topics  . Smoking status: Former Smoker -- 0.50 packs/day for 30 years    Quit date: 05/16/1959  . Smokeless tobacco: Not on file  . Alcohol Use: No    Allergies  Allergen Reactions  . Ivp Dye [Iodinated Diagnostic Agents] Shortness Of Breath  . Toviaz [Fesoterodine Fumarate Er] Swelling    Mouth swelling    Current Outpatient Prescriptions  Medication Sig Dispense Refill  . amLODipine (NORVASC) 5 MG tablet Take 5 mg by mouth daily.    Marland Kitchen aspirin 81 MG tablet Take 81 mg by mouth daily.    Marland Kitchen BESIVANCE 0.6 % SUSP Place 1 drop into both eyes 4 (four) times daily. For two days after monthly    . Butalbital-APAP-Caffeine (FIORICET) 50-300-40 MG CAPS  Take 1 tablet by mouth 2 (two) times daily.    . cimetidine (TAGAMET) 400 MG tablet Take 400 mg by mouth 2 (two) times daily.    . clopidogrel (PLAVIX) 75 MG tablet Take 75 mg by mouth daily.    . fish oil-omega-3 fatty acids 1000 MG capsule Take 1 g by mouth every morning.      . meclizine (ANTIVERT) 25 MG tablet Take 25 mg by mouth 3 (three) times daily as needed for dizziness.    . metFORMIN (GLUCOPHAGE) 1000 MG tablet Take 1,000 mg by mouth 2 (two) times daily with a meal.      . Multiple Vitamins-Minerals (MULTIVITAMINS THER. W/MINERALS) TABS Take 1 tablet by mouth every morning.      Marland Kitchen MYRBETRIQ 50 MG TB24 tablet Take 50 mg by mouth  daily.    Marland Kitchen PHYTOSTEROL ESTERS PO Take 900 mg by mouth daily.    . rosuvastatin (CRESTOR) 10 MG tablet Take 10 mg by mouth daily.    Marland Kitchen triamcinolone cream (KENALOG) 0.1 %     . nitroGLYCERIN (NITROSTAT) 0.4 MG SL tablet Place 0.4 mg under the tongue every 5 (five) minutes as needed. FOR CHEST PAIN      No current facility-administered medications for this visit.    Review of Systems  Constitutional: negative Eyes: negative Ears, nose, mouth, throat, and face: negative Respiratory: negative Cardiovascular: negative Gastrointestinal: negative Genitourinary:negative Integument/breast: negative Hematologic/lymphatic: negative Musculoskeletal:negative Neurological: negative Behavioral/Psych: negative Endocrine: negative Allergic/Immunologic: negative  Physical Exam  IHK:VQQVZ, healthy, no distress, well nourished and well developed SKIN: skin color, texture, turgor are normal, no rashes or significant lesions HEAD: Normocephalic, No masses, lesions, tenderness or abnormalities EYES: normal, PERRLA, Conjunctiva are pink and non-injected EARS: External ears normal, Canals clear OROPHARYNX:no exudate, no erythema and lips, buccal mucosa, and tongue normal  NECK: supple, no adenopathy, no JVD LYMPH:  no palpable lymphadenopathy, no hepatosplenomegaly LUNGS: clear to auscultation , and palpation HEART: regular rate & rhythm, no murmurs and no gallops ABDOMEN:abdomen soft, non-tender, normal bowel sounds and no masses or organomegaly BACK: Back symmetric, no curvature., No CVA tenderness EXTREMITIES:no joint deformities, effusion, or inflammation, no edema, no skin discoloration  NEURO: alert & oriented x 3 with fluent speech, no focal motor/sensory deficits  PERFORMANCE STATUS: ECOG 0  LABORATORY DATA: Lab Results  Component Value Date   WBC 6.6 12/01/2014   HGB 12.0* 12/01/2014   HCT 36.6* 12/01/2014   MCV 80.6 12/01/2014   PLT 249 12/01/2014      Chemistry        Component Value Date/Time   NA 137 12/01/2014 1256   NA 136 04/21/2011 2236   K 4.4 12/01/2014 1256   K 3.8 04/21/2011 2236   CL 101 04/21/2011 2236   CO2 26 12/01/2014 1256   CO2 26 04/21/2011 2236   BUN 16.0 12/01/2014 1256   BUN 14 04/21/2011 2236   CREATININE 0.9 12/01/2014 1256   CREATININE 0.94 04/22/2011 1738      Component Value Date/Time   CALCIUM 9.7 12/01/2014 1256   CALCIUM 8.9 04/21/2011 2236   ALKPHOS 100 12/01/2014 1256   ALKPHOS 69 04/21/2011 2236   AST 21 12/01/2014 1256   AST 18 04/21/2011 2236   ALT 16 12/01/2014 1256   ALT 13 04/21/2011 2236   BILITOT 0.30 12/01/2014 1256   BILITOT 0.2* 04/21/2011 2236       RADIOGRAPHIC STUDIES: No results found.  ASSESSMENT: This is a very pleasant 81 years old white male  with questionable stage I (T1a, N0, M0) non-small cell lung cancer presenting with right lower lobe pulmonary nodule. The patient also has multiple tiny nodules in the left lung that are nonspecific and could present inflammatory lesions but metastatic disease could not be excluded.   PLAN: I had a lengthy discussion with the patient and his wife today about his current condition and further investigation to confirm his diagnosis. I recommended for the patient to have a PET scan performed for further evaluation of the right lower lobe pulmonary nodule. If the right lower lobe nodule is hypermetabolic, I would refer the patient to thoracic surgery for evaluation of surgical resection, but if the patient is not a surgical candidate, he may be considered for CT-guided biopsy followed by stereotactic radiotherapy to this peripheral lesion. I will see the patient back for follow-up visit in 2 weeks for reevaluation and discussion of his treatment options based on the PET scan results The patient was advised to call immediately if he has any concerning symptoms in the interval. The patient voices understanding of current disease status and treatment options  and is in agreement with the current care plan.  All questions were answered. The patient knows to call the clinic with any problems, questions or concerns. We can certainly see the patient much sooner if necessary.  Thank you so much for allowing me to participate in the care of Garrett Henson. I will continue to follow up the patient with you and assist in his care.  I spent 40 minutes counseling the patient face to face. The total time spent in the appointment was 60 minutes.  Disclaimer: This note was dictated with voice recognition software. Similar sounding words can inadvertently be transcribed and may not be corrected upon review.   Reinaldo Helt K. December 01, 2014, 5:51 PM

## 2014-12-02 ENCOUNTER — Telehealth: Payer: Self-pay | Admitting: Internal Medicine

## 2014-12-02 NOTE — Telephone Encounter (Signed)
vm full.....mailed pt appt sched/avs and letter °

## 2014-12-09 ENCOUNTER — Encounter (INDEPENDENT_AMBULATORY_CARE_PROVIDER_SITE_OTHER): Payer: Medicare Other | Admitting: Ophthalmology

## 2014-12-09 DIAGNOSIS — H3531 Nonexudative age-related macular degeneration: Secondary | ICD-10-CM | POA: Diagnosis not present

## 2014-12-09 DIAGNOSIS — H3532 Exudative age-related macular degeneration: Secondary | ICD-10-CM | POA: Diagnosis not present

## 2014-12-09 DIAGNOSIS — H43813 Vitreous degeneration, bilateral: Secondary | ICD-10-CM

## 2014-12-09 DIAGNOSIS — H35033 Hypertensive retinopathy, bilateral: Secondary | ICD-10-CM

## 2014-12-09 DIAGNOSIS — I10 Essential (primary) hypertension: Secondary | ICD-10-CM

## 2014-12-09 DIAGNOSIS — E11319 Type 2 diabetes mellitus with unspecified diabetic retinopathy without macular edema: Secondary | ICD-10-CM

## 2014-12-09 DIAGNOSIS — E11329 Type 2 diabetes mellitus with mild nonproliferative diabetic retinopathy without macular edema: Secondary | ICD-10-CM

## 2014-12-11 ENCOUNTER — Ambulatory Visit (HOSPITAL_COMMUNITY)
Admission: RE | Admit: 2014-12-11 | Discharge: 2014-12-11 | Disposition: A | Discharge status: Home / Self Care | Payer: Medicare Other | Source: Ambulatory Visit | Attending: Internal Medicine | Admitting: Internal Medicine

## 2014-12-11 ENCOUNTER — Encounter (INDEPENDENT_AMBULATORY_CARE_PROVIDER_SITE_OTHER): Payer: Medicare Other | Admitting: Ophthalmology

## 2014-12-11 DIAGNOSIS — K8020 Calculus of gallbladder without cholecystitis without obstruction: Secondary | ICD-10-CM | POA: Diagnosis not present

## 2014-12-11 DIAGNOSIS — I2510 Atherosclerotic heart disease of native coronary artery without angina pectoris: Secondary | ICD-10-CM | POA: Insufficient documentation

## 2014-12-11 DIAGNOSIS — J320 Chronic maxillary sinusitis: Secondary | ICD-10-CM | POA: Insufficient documentation

## 2014-12-11 DIAGNOSIS — R918 Other nonspecific abnormal finding of lung field: Secondary | ICD-10-CM | POA: Diagnosis not present

## 2014-12-11 DIAGNOSIS — I700 Atherosclerosis of aorta: Secondary | ICD-10-CM | POA: Diagnosis not present

## 2014-12-11 LAB — GLUCOSE, CAPILLARY: Glucose-Capillary: 122 mg/dL — ABNORMAL HIGH (ref 65–99)

## 2014-12-11 MED ORDER — FLUDEOXYGLUCOSE F - 18 (FDG) INJECTION
8.4100 | Freq: Once | INTRAVENOUS | Status: AC | PRN
  Administered 2014-12-11: 8.41 via INTRAVENOUS

## 2014-12-23 ENCOUNTER — Encounter: Payer: Self-pay | Admitting: Internal Medicine

## 2014-12-23 ENCOUNTER — Telehealth: Payer: Self-pay | Admitting: Internal Medicine

## 2014-12-23 ENCOUNTER — Ambulatory Visit (HOSPITAL_BASED_OUTPATIENT_CLINIC_OR_DEPARTMENT_OTHER): Payer: Medicare Other | Admitting: Internal Medicine

## 2014-12-23 VITALS — BP 167/71 | HR 71 | Temp 98.0°F | Resp 17 | Ht 71.0 in | Wt 180.5 lb

## 2014-12-23 DIAGNOSIS — R918 Other nonspecific abnormal finding of lung field: Secondary | ICD-10-CM

## 2014-12-23 NOTE — Telephone Encounter (Signed)
per pof to sch PFT-gave pt time/date/location-copy of avs

## 2014-12-23 NOTE — Progress Notes (Signed)
[place] Telephone:([phone]) [phone]   Fax:(336) [place], MD [address] [place] [address]  DIAGNOSIS: Questionable stage IA (T1b, N0, M0) non-small cell lung cancer  PRIOR THERAPY: None  CURRENT THERAPY: None.  INTERVAL HISTORY: Garrett Henson 81 y.o. male returns to the clinic today for follow-up visit accompanied by his wife. The patient is feeling fine today with no specific complaints. He still very active and doing a lot of yard work. He denied having any significant chest pain, shortness breath, cough or hemoptysis. The patient denied having any significant weight loss or night sweats. He has no nausea or vomiting. He had a PET scan performed recently and he is here for evaluation and discussion of his scan results.  MEDICAL HISTORY: Past Medical History  Diagnosis Date  . Diabetes mellitus   . Coronary artery disease   . Prostate cancer   . Lung mass 12/01/2014  . Lung mass 12/01/2014    ALLERGIES:  is allergic to ivp dye and toviaz.  MEDICATIONS:  Current Outpatient Prescriptions  Medication Sig Dispense Refill  . amLODipine (NORVASC) 5 MG tablet Take 5 mg by mouth daily.    Marland Kitchen aspirin 81 MG tablet Take 81 mg by mouth daily.    Marland Kitchen BESIVANCE 0.6 % SUSP Place 1 drop into both eyes 4 (four) times daily. For two days after monthly    . Butalbital-APAP-Caffeine (FIORICET) 50-300-40 MG CAPS Take 1 tablet by mouth 2 (two) times daily.    . cimetidine (TAGAMET) 400 MG tablet Take 400 mg by mouth 2 (two) times daily.    . clopidogrel (PLAVIX) 75 MG tablet Take 75 mg by mouth daily.    . fish oil-omega-3 fatty acids 1000 MG capsule Take 1 g by mouth every morning.      . meclizine (ANTIVERT) 25 MG tablet Take 25 mg by mouth 3 (three) times daily as needed for dizziness.    . metFORMIN (GLUCOPHAGE) 1000 MG tablet Take 1,000 mg by mouth 2 (two) times daily with a meal.      . Multiple Vitamins-Minerals  (MULTIVITAMINS THER. W/MINERALS) TABS Take 1 tablet by mouth every morning.      Marland Kitchen MYRBETRIQ 50 MG TB24 tablet Take 50 mg by mouth daily.    . nitroGLYCERIN (NITROSTAT) 0.4 MG SL tablet Place 0.4 mg under the tongue every 5 (five) minutes as needed. FOR CHEST PAIN     . PHYTOSTEROL ESTERS PO Take 900 mg by mouth daily.    . rosuvastatin (CRESTOR) 10 MG tablet Take 10 mg by mouth daily.    Marland Kitchen triamcinolone cream (KENALOG) 0.1 %      No current facility-administered medications for this visit.    SURGICAL HISTORY:  Past Surgical History  Procedure Laterality Date  . Prostatectomy    . Hip surgery      REVIEW OF SYSTEMS:  A comprehensive review of systems was negative.   PHYSICAL EXAMINATION: General appearance: alert, cooperative and no distress Head: Normocephalic, without obvious abnormality, atraumatic Neck: no adenopathy, no JVD, supple, symmetrical, trachea midline and thyroid not enlarged, symmetric, no tenderness/mass/nodules Lymph nodes: Cervical, supraclavicular, and axillary nodes normal. Resp: clear to auscultation bilaterally Back: symmetric, no curvature. ROM normal. No CVA tenderness. Cardio: regular rate and rhythm, S1, S2 normal, no murmur, click, rub or gallop GI: soft, non-tender; bowel sounds normal; no masses,  no organomegaly Extremities: extremities normal, atraumatic, no cyanosis or edema  ECOG PERFORMANCE STATUS: 1 - Symptomatic but completely ambulatory  Blood pressure 167/71, pulse 71, temperature 98 F (36.7 C), temperature source Oral, resp. rate 17, height '5\' 11"'$  (1.803 m), weight 180 lb 8 oz (81.874 kg), SpO2 98 %.  LABORATORY DATA: Lab Results  Component Value Date   WBC 6.6 12/01/2014   HGB 12.0* 12/01/2014   HCT 36.6* 12/01/2014   MCV 80.6 12/01/2014   PLT 249 12/01/2014      Chemistry      Component Value Date/Time   NA 137 12/01/2014 1256   NA 136 04/21/2011 2236   K 4.4 12/01/2014 1256   K 3.8 04/21/2011 2236   CL 101 04/21/2011 2236    CO2 26 12/01/2014 1256   CO2 26 04/21/2011 2236   BUN 16.0 12/01/2014 1256   BUN 14 04/21/2011 2236   CREATININE 0.9 12/01/2014 1256   CREATININE 0.94 04/22/2011 1738      Component Value Date/Time   CALCIUM 9.7 12/01/2014 1256   CALCIUM 8.9 04/21/2011 2236   ALKPHOS 100 12/01/2014 1256   ALKPHOS 69 04/21/2011 2236   AST 21 12/01/2014 1256   AST 18 04/21/2011 2236   ALT 16 12/01/2014 1256   ALT 13 04/21/2011 2236   BILITOT 0.30 12/01/2014 1256   BILITOT 0.2* 04/21/2011 2236       RADIOGRAPHIC STUDIES: Nm Pet Image Initial (pi) Skull Base To Thigh  12/11/2014   CLINICAL DATA:  Initial treatment strategy for lung mass.  EXAM: NUCLEAR MEDICINE PET SKULL BASE TO THIGH  TECHNIQUE: 8.4 mCi F-18 FDG was injected intravenously. Full-ring PET imaging was performed from the skull base to thigh after the radiotracer. CT data was obtained and used for attenuation correction and anatomic localization.  FASTING BLOOD GLUCOSE:  Value: 122 mg/dl  COMPARISON:  10/30/2014  FINDINGS: NECK  No hypermetabolic lymph nodes in the neck. Mild chronic right maxillary sinusitis.  CHEST  The superior segment right lower lobe nodule measures 2.7 by 1.3 cm on image 31 series 6 and has maximum standard uptake value of 5.2.  No hypermetabolic adenopathy. Coronary, aortic arch, and branch vessel atherosclerotic vascular disease. The tiny nodules referenced on prior CT are below PET-CT sensitive size thresholds.  ABDOMEN/PELVIS  Physiologic activity in the bowel. Cholelithiasis. Aortoiliac atherosclerotic vascular disease. Otherwise unremarkable.  SKELETON  No focal hypermetabolic activity to suggest skeletal metastasis. Postoperative or posttraumatic findings along the right iliac crest. Bilateral hip hardware in place.  IMPRESSION: 1. The right lower lobe superior segment nodule is hypermetabolic, compatible with malignancy. It abuts the pleural surface but pleural invasion is not definitively shown. Assuming non-small  cell lung cancer, this appearance is compatible with T1b N0 M0 disease (stage IA). 2. The other tiny nodules shown on CT are not hypermetabolic but are far below sensitive PET-CT size thresholds. These likely merit periodic chest CT follow up. 3. Atherosclerosis. 4. Cholelithiasis. 5. Chronic right maxillary sinusitis.   Electronically Signed   By: Van Clines M.D.   On: 12/11/2014 10:15    ASSESSMENT AND PLAN: This is a very pleasant 81 years old white male with questionably stage IA non-small cell lung cancer pending tissue diagnosis. I had a lengthy discussion with the patient and his wife about his current disease status and treatment options. The PET scan showed no evidence for metastatic disease and only 3 right lower lobe superior segment hypermetabolic nodule. I recommended for the patient to see a cardiothoracic surgeon for evaluation of resection. I will order pulmonary function  test to be available before his surgical evaluation. If the patient is not a good surgical candidate, we will arrange for the patient to have CT-guided core biopsy of the left lower lobe lung mass followed by stereotactic radiotherapy. He will come back for follow-up visit after his surgical evaluation. The patient was advised to call immediately if he has any concerning symptoms in the interval. The patient voices understanding of current disease status and treatment options and is in agreement with the current care plan.  All questions were answered. The patient knows to call the clinic with any problems, questions or concerns. We can certainly see the patient much sooner if necessary.  Disclaimer: This note was dictated with voice recognition software. Similar sounding words can inadvertently be transcribed and may not be corrected upon review.

## 2014-12-28 ENCOUNTER — Telehealth: Payer: Self-pay | Admitting: Medical Oncology

## 2014-12-28 ENCOUNTER — Telehealth: Payer: Self-pay | Admitting: *Deleted

## 2014-12-28 NOTE — Telephone Encounter (Signed)
Oncology Nurse Navigator Documentation  Oncology Nurse Navigator Flowsheets 12/28/2014  Navigator Encounter Type Other/I called [facility] and spoke with Ebony Hail.  She gave me appt for patient to be seen with Dr. Servando Snare on 12/29/14 arrive at 2:00.  I called and spoke with patient's wife.  She is aware of time and place of appt.    Treatment Phase Abnormal Scans  Barriers/Navigation Needs No barriers at this time  Interventions Coordination of Care  Time Spent with Patient 15

## 2014-12-28 NOTE — Telephone Encounter (Signed)
appt given to wife for Dr Servando Snare

## 2014-12-29 ENCOUNTER — Institutional Professional Consult (permissible substitution) (INDEPENDENT_AMBULATORY_CARE_PROVIDER_SITE_OTHER): Payer: Medicare Other | Admitting: Cardiothoracic Surgery

## 2014-12-29 VITALS — BP 175/72 | HR 62 | Resp 16 | Ht 71.5 in | Wt 172.0 lb

## 2014-12-29 DIAGNOSIS — D381 Neoplasm of uncertain behavior of trachea, bronchus and lung: Secondary | ICD-10-CM | POA: Diagnosis not present

## 2014-12-29 NOTE — Progress Notes (Signed)
[address]       [place] [address]             [id number]                    Garrett Henson [facility] #[id number] Date of Birth: September 07, 1924  Referring: Curt Bears, MD Primary Care: Myriam Jacobson, MD  Chief Complaint:    Chief Complaint  Patient presents with  . Lung Lesion    RLL.Marland KitchenMarland KitchenCT CHEST 10/30/14, PET 12/11/14, PFT SCHEDULED FOR 12/30/14    History of Present Illness:    Garrett Henson 81 y.o. male is seen in the office  today for a right hypermetabolic lung lesion. In mid June the patient was hauling lumber out of his yard after taking apart and old swimming pool. He developed discomfort in his left groin and was seen in the emergency room, patient notes he was told he had a hernia subsequently this is disappeared. While in emergency room a CT scan of the abdomen was performed this demonstrated a lung nodule and a formal CT scan of the chest was performed. July 29 the patient had a PET scan. He discussed the findings with Dr. Julien Nordmann and was referred for consideration of surgical resection of what appears to be clinically a stage I carcinoma the lung. The patient does have a previous history of a stroke right frontal and parietal affecting his left side but he recovered very quickly. He had a myocardial infarction 4-5 years ago, treated medically. He has a history of diabetes on oral agents. He has a history of prostate cancer status post prostatectomy 20 years ago.  Spiral the patient's age he remains active working around his yard. He has a previous smoker but quit in 1961.   He denies any occupational exposure to hazardous chemicals or asbestos. 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   Zubrod Score: At the time of surgery this patient's most appropriate activity status/level should be described as: '[]'$      0    Normal activity, no symptoms '[x]'$     1    Restricted in physical strenuous activity but ambulatory, able to do out light work '[]'$     2    Ambulatory and capable of self care, unable to do work activities, up and about               >50 % of waking hours                              '[]'$     3    Only limited self care, in bed greater than 50% of waking hours '[]'$     4    Completely disabled, no self care, confined to bed or chair '[]'$     5    Moribund   Past Medical History  Diagnosis Date  . Diabetes mellitus   . Coronary artery disease   . Prostate cancer   . Lung mass 12/01/2014  . Lung mass 12/01/2014    Past Surgical History  Procedure Laterality Date  . Prostatectomy    . Hip surgery      Family History  Problem Relation Age of Onset  . Hyperlipidemia    . Hypertension    . Diabetes    . Deep vein  thrombosis    . Stroke    . Coronary artery disease      Social History   Social History  . Marital Status: Married    Spouse Name: N/A  . Number of Children: N/A  . Years of Education: N/A   Occupational History  . Not on file.   Social History Main Topics  . Smoking status: Former Smoker -- 0.50 packs/day for 30 years    Quit date: 05/16/1959  . Smokeless tobacco: Not on file  . Alcohol Use: No  . Drug Use: No  . Sexual Activity: Not on file   Other Topics Concern  . Not on file   Social History Narrative    History  Smoking status  . Former Smoker -- 0.50 packs/day for 30 years  . Quit date: 05/16/1959  Smokeless tobacco  . Not on file    History  Alcohol Use No     Allergies  Allergen Reactions  . Ivp Dye [Iodinated Diagnostic Agents] Shortness Of Breath  . Toviaz [Fesoterodine Fumarate Er] Swelling    Mouth swelling    Current Outpatient Prescriptions  Medication Sig Dispense Refill  . amLODipine (NORVASC) 5 MG tablet Take 5 mg by mouth daily.    Marland Kitchen aspirin 81 MG tablet Take 81 mg by mouth daily.    Marland Kitchen BESIVANCE 0.6 % SUSP Place 1 drop  into both eyes 4 (four) times daily. For two days after monthly    . Butalbital-APAP-Caffeine (FIORICET) 50-300-40 MG CAPS Take 1 tablet by mouth 2 (two) times daily.    . cimetidine (TAGAMET) 400 MG tablet Take 400 mg by mouth 2 (two) times daily.    . clopidogrel (PLAVIX) 75 MG tablet Take 75 mg by mouth daily.    . fish oil-omega-3 fatty acids 1000 MG capsule Take 1 g by mouth every morning.      . meclizine (ANTIVERT) 25 MG tablet Take 25 mg by mouth 3 (three) times daily as needed for dizziness.    . metFORMIN (GLUCOPHAGE) 1000 MG tablet Take 1,000 mg by mouth 2 (two) times daily with a meal.      . Multiple Vitamins-Minerals (MULTIVITAMINS THER. W/MINERALS) TABS Take 1 tablet by mouth every morning.      Marland Kitchen MYRBETRIQ 50 MG TB24 tablet Take 50 mg by mouth daily.    . nitroGLYCERIN (NITROSTAT) 0.4 MG SL tablet Place 0.4 mg under the tongue every 5 (five) minutes as needed. FOR CHEST PAIN     . PHYTOSTEROL ESTERS PO Take 900 mg by mouth daily.    . rosuvastatin (CRESTOR) 10 MG tablet Take 10 mg by mouth daily.    Marland Kitchen triamcinolone cream (KENALOG) 0.1 %      No current facility-administered medications for this visit.      Review of Systems:     Cardiac Review of Systems: Y or N  Chest Pain [ n   ]  Resting SOB [ n  ] Exertional SOB  Blue.Reese  ]  Orthopnea [  n]   Pedal Edema Florencio.Farrier   ]    Palpitations [ n ] Syncope  [ n ]   Presyncope [ y  ]  General Review of Systems: [Y] = yes [  ]=no Constitional: recent weight change [n  ];  Wt loss over the last 3 months [   ] anorexia [  ]; fatigue Blue.Reese  ]; nausea [  ]; night sweats [n  ]; fever [n  ]; or chills [  ];  Dental: poor dentition[  ]; Last Dentist visit:   Eye : blurred vision [  ]; diplopia [   ]; vision changes [  ];  Amaurosis fugax[  ]; Resp: cough [  ];  wheezing[  ];  hemoptysis[  ]; shortness of breath[  ]; paroxysmal nocturnal dyspnea[  ]; dyspnea on exertion[  ]; or orthopnea[  ];  GI:  gallstones[  ], vomiting[  ];  dysphagia[  ];  melena[  ];  hematochezia [  ]; heartburn[  ];   Hx of  Colonoscopy[  ]; GU: kidney stones [ n ]; hematuria[ n ];   dysuria [n  ];  nocturia[n  ];  history of     obstruction [ n ]; urinary frequency [ y ]             Skin: rash, swelling[  ];, hair loss[  ];  peripheral edema[  ];  or itching[  ]; Musculosketetal: myalgias[  ];  joint swelling[  ];  joint erythema[  ];  joint pain[  ];  back pain[  ];  Heme/Lymph: bruising[ y ];  bleeding[  ];  anemia[  ];  Neuro: TIA[ n ];  headaches[ n ];  stroke[ y ];  vertigo[y  ];  seizures[  n];   paresthesias[ n ];  difficulty walking[ n ];  Psych:depression[  ]; anxiety[  ];  Endocrine: diabetes[y  ];  thyroid dysfunction[ n ];  Immunizations: Flu up to date Blue.Reese  ]; Pneumococcal up to date [ y];  Other:  Physical Exam: BP 175/72 mmHg  Pulse 62  Resp 16  Ht 5' 11.5" (1.816 m)  Wt 172 lb (78.019 kg)  BMI 23.66 kg/m2  PHYSICAL EXAMINATION: General appearance: alert, cooperative, appears stated age and no distress Head: Normocephalic, without obvious abnormality, atraumatic Neck: no adenopathy, no carotid bruit, no JVD, supple, symmetrical, trachea midline and thyroid not enlarged, symmetric, no tenderness/mass/nodules Lymph nodes: Cervical, supraclavicular, and axillary nodes normal. Resp: clear to auscultation bilaterally Back: symmetric, no curvature. ROM normal. No CVA tenderness. Cardio: regular rate and rhythm, S1, S2 normal, no murmur, click, rub or gallop GI: soft, non-tender; bowel sounds normal; no masses,  no organomegaly Extremities: extremities normal, atraumatic, no cyanosis or edema and Homans sign is negative, no sign of DVT Neurologic: Grossly normal  Diagnostic Studies & Laboratory data:     Recent Radiology Findings:     Nm Pet Image Initial (pi) Skull Base To Thigh  12/11/2014   CLINICAL DATA:  Initial treatment strategy for lung mass.  EXAM: NUCLEAR MEDICINE PET SKULL BASE TO THIGH  TECHNIQUE: 8.4 mCi F-18 FDG was  injected intravenously. Full-ring PET imaging was performed from the skull base to thigh after the radiotracer. CT data was obtained and used for attenuation correction and anatomic localization.  FASTING BLOOD GLUCOSE:  Value: 122 mg/dl  COMPARISON:  10/30/2014  FINDINGS: NECK  No hypermetabolic lymph nodes in the neck. Mild chronic right maxillary sinusitis.  CHEST  The superior segment right lower lobe nodule measures 2.7 by 1.3 cm on image 31 series 6 and has maximum standard uptake value of 5.2.  No hypermetabolic adenopathy. Coronary, aortic arch, and branch vessel atherosclerotic vascular disease. The tiny nodules referenced on prior CT are below PET-CT sensitive size thresholds.  ABDOMEN/PELVIS  Physiologic activity in the bowel. Cholelithiasis. Aortoiliac atherosclerotic vascular disease. Otherwise unremarkable.  SKELETON  No focal hypermetabolic activity to suggest skeletal metastasis. Postoperative or posttraumatic findings along the right iliac crest. Bilateral  hip hardware in place.  IMPRESSION: 1. The right lower lobe superior segment nodule is hypermetabolic, compatible with malignancy. It abuts the pleural surface but pleural invasion is not definitively shown. Assuming non-small cell lung cancer, this appearance is compatible with T1b N0 M0 disease (stage IA). 2. The other tiny nodules shown on CT are not hypermetabolic but are far below sensitive PET-CT size thresholds. These likely merit periodic chest CT follow up. 3. Atherosclerosis. 4. Cholelithiasis. 5. Chronic right maxillary sinusitis.   Electronically Signed   By: Van Clines M.D.   On: 12/11/2014 10:15     CLINICAL DATA: Right lower lobe pulmonary nodule.  EXAM: CT CHEST WITHOUT CONTRAST  TECHNIQUE: Multidetector CT imaging of the chest was performed following the standard protocol without IV contrast.  COMPARISON: Chest x-ray dated 10/26/2014 and CT scan of the abdomen dated 10/16/2014  FINDINGS: There is a 28  x 28 mm mass in the 18 x 18 mm spiculated mass in the posterior superior aspect of the right lower lobe consistent with a carcinoma of the lung.  There is a 3 mm nodule in the left upper lobe laterally on image 13 of series 6, a 2.2 mm nodule in the left lower lobe on image 33 of series 6 and 2 mm vague density in the left upper lobe on image 30 of series 6 and a 3 mm density in the left upper lobe on image 37 of series 6. There are 2 small intrapulmonary nodes in the right lung. These are not significant.  There is no appreciable hilar or mediastinal adenopathy. Heart size is normal. Extensive coronary artery calcification. Extensive calcification in the thoracic aorta.  The left lobe of the thyroid gland appears to have been removed.  Images of the upper abdomen demonstrate multiple small gallstones. Chronic accentuation of the thoracic kyphosis with an old compression fracture of T12.  IMPRESSION: 1. Spiculated 20 mm mass in the right lower lobe consistent with carcinoma of the lung. 2. Multiple tiny nodules in the left lung, indeterminate. Follow-up CT scan without contrast in 6 months recommended. 3. Cholelithiasis.   Electronically Signed  By: Lorriane Shire M.D.  On: 10/30/2014 15:43      I have independently reviewed the above radiology studies  and reviewed the findings with the patient.   6 Minute Walk Test Results  Patient:Braxtyn R Figuereo Date:12/29/2014   Supplemental O2 during test?none   BaselineEnd  [id number] Heartrate6280 Dyspneanoslight Fatiguenosloght O2  sat97%92% Blood pressure175/72170/80   Patient ambulated at a steady pace for a total distance of 980 feet with no stops.  Ambulation was limited primarily due to nothing  Overall the test was tolerated well     Recent Lab Findings: Lab Results  Component Value Date   WBC 6.6 12/01/2014   HGB 12.0* 12/01/2014   HCT 36.6* 12/01/2014   PLT 249 12/01/2014   GLUCOSE 184* 12/01/2014   CHOL 145 04/23/2011   TRIG 77 04/23/2011   HDL 59 04/23/2011   LDLCALC 71 04/23/2011   ALT 16 12/01/2014   AST 21 12/01/2014   NA 137 12/01/2014   K 4.4 12/01/2014   CL 101 04/21/2011   CREATININE 0.9 12/01/2014   BUN 16.0 12/01/2014   CO2 26 12/01/2014   INR 1.07 04/21/2011   HGBA1C 6.6* 04/23/2011      Assessment / Plan:   80 years old white male with questionable stage I (T1a, N0, M0) non-small cell lung cancer presenting with right lower lobe pulmonary nodule. The  patient also has multiple tiny nodules in the left lung that are nonspecific and could present inflammatory lesions but metastatic disease could not be excluded. I have discussed the likely diagnosis of lung cancer with the patient and his wife treatment options including surgical resection possibly video-assisted thoracoscopy and wedge resection versus stereotactic radiotherapy were discussed with the patient. Pulmonary function studies are pending for tomorrow, the patient by history is not significant limited shortness of breath. He did read his may well on 6 minute walk test in the office today. He does have a history of diabetes, stroke, coronary occlusive disease and before proceeding with any invasive procedure he would need cardiology clearance from a cardiac standpoint. We will arrange this as soon as possible and reviewed the pulmonary function studies when they're available and I will plan on seeing the patient  back next week to make a final decision about surgical or ablative treatment with radiation for his presumed right lower lobe 2.2 cm lung mass. Regardless of treatment he will need continued serial CT scans to evaluate the 3 mm nodules present in both the left and right lung.       Atherosclerosis of coronary arteries, noted on ct of chest  Cholelithiasis.  I  spent 40 minutes counseling the patient face to face and 50% or more the  time was spent in counseling and coordination of care. The total time spent in the appointment was 60 minutes.  [name] MD      [address] [place] [address] Office [phone]   Beeper [phone]  12/29/2014 4:27 PM

## 2014-12-29 NOTE — Progress Notes (Signed)
6 Minute Walk Test Results  Patient: Garrett Henson Date:  12/29/2014   Supplemental O2 during test? none      Baseline   End  Time   1648    1654 Heartrate  62    80 Dyspnea  no    slight Fatigue  no    sloght O2 sat   97%    92% Blood pressure 175/72    170/80   Patient ambulated at a steady pace for a total distance of 980 feet with no stops.  Ambulation was limited primarily due to nothing  Overall the test was tolerated well

## 2014-12-29 NOTE — Patient Instructions (Signed)
Pulmonary Nodule A pulmonary nodule is a small, round growth of tissue in the lung. Pulmonary nodules can range in size from less than 1/5 inch (4 mm) to a little bigger than an inch (25 mm). Most pulmonary nodules are detected when imaging tests of the lung are being performed for a different problem. Pulmonary nodules are usually not cancerous (benign). However, some pulmonary nodules are cancerous (malignant). Follow-up treatment or testing is based on the size of the pulmonary nodule and your risk of getting lung cancer.  CAUSES Benign pulmonary nodules can be caused by various things. Some of the causes include:   Bacterial, fungal, or viral infections. This is usually an old infection that is no longer active, but it can sometimes be a current, active infection.  A benign mass of tissue.  Inflammation from conditions such as rheumatoid arthritis.   Abnormal blood vessels in the lungs. Malignant pulmonary nodules can result from lung cancer or from cancers that spread to the lung from other places in the body. SIGNS AND SYMPTOMS Pulmonary nodules usually do not cause symptoms. DIAGNOSIS Most often, pulmonary nodules are found incidentally when an X-ray or CT scan is performed to look for some other problem in the lung area. To help determine whether a pulmonary nodule is benign or malignant, your health care provider will take a medical history and order a variety of tests. Tests done may include:   Blood tests.  A skin test called a tuberculin test. This test is used to determine if you have been exposed to the germ that causes tuberculosis.   Chest X-rays. If possible, a new X-ray may be compared with X-rays you have had in the past.   CT scan. This test shows smaller pulmonary nodules more clearly than an X-ray.   Positron emission tomography (PET) scan. In this test, a safe amount of a radioactive substance is injected into the bloodstream. Then, the scan takes a picture of  the pulmonary nodule. The radioactive substance is eliminated from your body in your urine.   Biopsy. A tiny piece of the pulmonary nodule is removed so it can be checked under a microscope. TREATMENT  Pulmonary nodules that are benign normally do not require any treatment because they usually do not cause symptoms or breathing problems. Your health care provider may want to monitor the pulmonary nodule through follow-up CT scans. The frequency of these CT scans will vary based on the size of the nodule and the risk factors for lung cancer. For example, CT scans will need to be done more frequently if the pulmonary nodule is larger and if you have a history of smoking and a family history of cancer. Further testing or biopsies may be done if any follow-up CT scan shows that the size of the pulmonary nodule has increased. HOME CARE INSTRUCTIONS  Only take over-the-counter or prescription medicines as directed by your health care provider.  Keep all follow-up appointments with your health care provider. SEEK MEDICAL CARE IF:  You have trouble breathing when you are active.   You feel sick or unusually tired.   You do not feel like eating.   You lose weight without trying to.   You develop chills or night sweats.  SEEK IMMEDIATE MEDICAL CARE IF:  You cannot catch your breath, or you begin wheezing.   You cannot stop coughing.   You cough up blood.   You become dizzy or feel like you are going to pass out.   You   have sudden chest pain.   You have a fever or persistent symptoms for more than 2-3 days.   You have a fever and your symptoms suddenly get worse. MAKE SURE YOU:  Understand these instructions.  Will watch your condition.  Will get help right away if you are not doing well or get worse. Document Released: 02/26/2009 Document Revised: 01/01/2013 Document Reviewed: 10/21/2012 [employer] Patient Information 2015 [employer]. This information is not intended  to replace advice given to you by your health care provider. Make sure you discuss any questions you have with your health care provider.   Lung Cancer Lung cancer is an abnormal growth of cells in one or both of your lungs. These extra cells may form a mass of tissue called a growth or tumor. Tumors can be either cancerous (malignant) or not cancerous (benign).  Lung cancer is the most common cause of cancer death in men and women. There are several different types of lung cancers. Usually, lung cancer is described as either small cell lung cancer or nonsmall cell lung cancer. Other types of cancer occur in the lungs, including carcinoid and cancers spread from other organs. The types of cancer have different behavior and treatment. RISK FACTORS Smoking is the most common risk factor for developing lung cancer. Other risk factors include:  Radon gas exposure.  Asbestos and other industrial substance exposure.  Second hand tobacco smoke.  Air pollution.  Family or personal history of lung cancer.  Age older than 65 years. CAUSES  Lung cancer usually starts when the lungs are exposed to harmful chemicals. Smoking is the most common risk factor for lung cancer. When you quit smoking, your risk of lung cancer falls each year (but is never the same as a person who has never smoked).  SYMPTOMS  Lung cancer may not have any symptoms in its early stages. The symptoms can depend on the type of cancer, its location, and other factors. Symptoms can include:  Cough (either new, different, or more severe).  Shortness of breath.  Coughing up blood (hemoptysis).  Chest pain.  Hoarseness.  Swelling of the face.  Drooping eyelid.  Changes in blood tests, such as low sodium (hyponatremia), high calcium (hypercalcemia), or low blood count (anemia).  Weight loss. DIAGNOSIS  Your health care provider may suspect lung cancer based on your symptoms or based on tests obtained for other reasons.  Tests or procedures used to find or confirm the presence of lung cancer may include:  Chest X-ray.  CT scan of the lungs and chest.  Blood tests.  Taking a tissue sample (biopsy) from your lung to look for cancer cells. Your cancer will be staged to determine its severity and extent. Staging is a careful attempt to find out the size of the tumor, whether the cancer has spread, and if so, to what parts of the body. You may need to have more tests to determine the stage of your cancer. The test results will help determine what treatment plan is best for you.   Stage 0--This is the earliest stage of lung cancer. In this stage the tumor is present in only a few layers of cells and has not grown beyond the inner lining of the lungs. Stage 0 (carcinoma in situ) is considered noninvasive, meaning at this stage it is not yet capable of spreading to other regions.  Stage I-- The cancer is located only in the lungs and not spread to any lymph nodes.  Stage II--The cancer   is in the lungs and the nearby lymph nodes.  Stage III--The cancer is in the lungs and the lymph nodes in the middle of the chest. This is also called locally advanced disease. This stage has two subtypes:  Stage IIIa - The cancer has spread only to lymph nodes on the same side of the chest where the cancer started.  Stage IIIb - The cancer has spread to lymph nodes on the opposite side of the chest or above the collar bone.  Stage IV-- This is the most advanced stage of lung cancer and is also called advanced disease. This stage describes when the cancer has spread to both lungs, the fluid in the area around the lungs, or to another body part. Your health care provider may tell you the detailed stage of your cancer, which includes both a number and a letter.  TREATMENT  Depending on the type and stage, lung cancer may be treated with surgery, radiation therapy, chemotherapy, or targeted therapy. Some people have a combination of  these therapies. Your treatment plan will be developed by your health care team.  HOME CARE INSTRUCTIONS   Do not smoke.  Only take over-the-counter or prescription medicines for pain, discomfort, or fever as directed by your health care provider.  Maintain a healthy diet.  Consider joining a support group. This may help you learn to cope with the stress of having lung cancer.  Seek advice to help you manage treatment side effects.  Keep all follow-up appointments as directed by your health care provider.  Inform your cancer specialist if you are admitted to the hospital. SEEK MEDICAL CARE IF:   You are losing weight without trying.  You have a persistent cough.  You feel short of breath.  You tire easily. SEEK IMMEDIATE MEDICAL CARE IF:   You cough up clotted blood or bright red blood.  Your pain is not manageable or controlled by medicine.  You develop new difficulty breathing or chest pain.  You develop swelling in one or both ankles or legs, or swelling in your face or neck.  You develop headache or confusion. Document Released: 08/07/2000 Document Revised: 02/19/2013 Document Reviewed: 09/04/2013 [employer] Patient Information 2015 [employer]. This information is not intended to replace advice given to you by your health care provider. Make sure you discuss any questions you have with your health care provider.  Lung Resection A lung resection is a procedure to remove part or all of a lung. When an entire lung is removed, the procedure is called a pneumonectomy. When only part of a lung is removed, the procedure is called a lobectomy. A lung resection is typically done to get rid of a tumor or cancer, but it may be done to treat other conditions. This procedure can help relieve some or all of your symptoms and can also help keep the problem from getting worse. Lung resection may provide the best chance for curing your disease. However, the procedure may not necessarily  cure lung cancer if that is the problem.  LET YOUR HEALTH CARE PROVIDER KNOW ABOUT:   Any allergies you have.  All medicines you are taking, including vitamins, herbs, eye drops, creams, and over-the-counter medicines.  Previous problems you or members of your family have had with the use of anesthetics.  Any blood disorders you have.  Previous surgeries you have had.  Medical conditions you have. RISKS AND COMPLICATIONS  Generally, lung resection is a safe procedure. However, problems can occur and include:    Excessive bleeding.  Infection.  Inability to breathe without a ventilator.  Persistent shortness of breath.  Heart problems, including abnormal rhythms and a risk of heart attack or heart failure.  Blood clots.  Injury to a blood vessel.  Injury to a nerve.  Failure to heal properly.  Stroke.  Bronchopleural fistula. This is a small hole between one of the main breathing tubes (bronchus) and the lining of the lungs. This is rare.  Reaction to anesthesia. BEFORE THE PROCEDURE  You may have tests done before the procedure, including:  Blood tests.  Urine tests.  X-rays.  Other imaging tests (such as CT scans, MRI scans, and PET scans). These tests are done to find the exact size and location of the condition being treated with this surgery.  Pulmonary function tests. These are breathing tests to assess the function of your lungs before surgery and to decide how to best help your breathing after surgery.  Heart testing. This is done to make sure your heart is strong enough for the procedure.  Bronchoscopy. This is a technique that allows your health care provider to look at the inside of your airways. This is done using a soft, flexible tube (bronchoscope). Along with imaging tests, this can help your health care provider know the exact location and size of the area that will be removed during surgery.  Lymph node sampling. This may need to be done to see  if the tumor has spread. It may be done as a separate surgery or right before your lung resection procedure. PROCEDURE  An IV tube will be placed in your arm. You will be given a medicine that makes you fall asleep (general anesthetic). You may also get pain medicine through a thin, flexible tube (catheter) in your back.  A breathing tube will be placed in your throat.  Once the surgical team has prepared you for surgery, your surgeon will make an incision on your side. Some resections are done through large incisions, while others can be done through small incisions using smaller instruments and assisted with small cameras (laparoscopic surgery).  Your surgeon will carefully cut the veins, arteries, and bronchus leading to your lung. After being cut, each of these pieces will be sewn or stapled closed. The lung or part of the lung will then be removed.  Your surgeon will check inside your chest to make sure there is no bleeding in or around the lungs. Lymph nodes near the lung may also be removed for later tests.  Your surgeon may put tubes into your chest to drain extra fluid and air after surgery.  Your incision will be closed. This may be done using:  Stitches that absorb into your body and do not need to be removed.  Stitches that must be removed.  Staples that must be removed. AFTER THE PROCEDURE   You will be taken to the recovery area and your progress will be monitored. You may still have a breathing tube and other tubes or catheters in your body immediately after surgery. These will be removed during your recovery. You may be put on a respirator following surgery if some assistance is needed to help your breathing. When you are awake and not experiencing immediate problems from surgery, you will be moved to the intensive care unit (ICU) where you will continue your recovery.  You may feel pain in your chest and throat. Sometimes during recovery, patients may shiver or feel  nauseous. You will be given medicine to   help with pain and nausea.  The breathing tube will be taken out as soon as your health care providers feel you can breathe on your own. For most people, this happens on the same day as the surgery.  If your surgery and time in the ICU go well, most of the tubes and equipment will be taken out within 1-2 days after surgery. This is about how long most people stay in the ICU. You may need to stay longer, depending on how you are doing.  You should also start respiratory therapy in the ICU. This therapy uses breathing exercises to help your other lung stay healthy and get stronger.  As you improve, you will be moved to a regular hospital room for continued respiratory therapy, help with your bladder and bowels, and to continue medicines.  After your lung or part of your lung is taken out, there will be a space inside your chest. This space will often fill up with fluid over time. The amount of time this takes is different for each person.  You will receive care until you are doing well and your health care provider feels it is safe for you to go home or to transfer to an extended care facility. Document Released: 07/22/2002 Document Revised: 09/15/2013 Document Reviewed: 06/20/2013 [employer] Patient Information 2015 [employer]. This information is not intended to replace advice given to you by your health care provider. Make sure you discuss any questions you have with your health care provider.  Lung Resection, Care After Refer to this sheet in the next few weeks. These instructions provide you with information on caring for yourself after your procedure. Your health care provider may also give you more specific instructions. Your treatment has been planned according to current medical practices, but problems sometimes occur. Call your health care provider if you have any problems or questions after your procedure. WHAT TO EXPECT AFTER THE PROCEDURE After  your procedure, it is typical to have the following:   You may feel pain in your chest and throat.  Patients may sometimes shiver or feel nauseous during recovery. HOME CARE INSTRUCTIONS  You may resume a normal diet and activities as directed by your health care provider.  Do not use any tobacco products, including cigarettes, chewing tobacco, or electronic cigarettes. If you need help quitting, ask your health care provider.  There are many different ways to close and cover an incision, including stitches, skin glue, and adhesive strips. Follow your health care provider's instructions on:  Incision care.  Bandage (dressing) changes and removal.  Incision closure removal.  Take medicines only as directed by your health care provider.  Keep all follow-up visits as directed by your health care provider. This is important.  Try to breathe deeply and cough as directed. Holding a pillow firmly over your ribs may help with discomfort.  If you were given an incentive spirometer in the hospital, continue to use it as directed by your health care provider.  Walk as directed by your health care provider.  You may take a shower and gently wash the area of your incision with water and soap as directed by your health care provider. Do not use anything else to clean your incision except as directed by your health care provider. Do not take baths, swim, or use a hot tub until your health care provider approves. SEEK MEDICAL CARE IF:  You notice redness, swelling, or increasing pain at the incision site.  You are bleeding at   the incision site.  You see pus coming from the incision site.  You notice a bad smell coming from the incision site or bandage.  Your incision breaks open.  You cough up blood or pus, or you develop a cough that produces bad-smelling sputum.  You have pain or swelling in your legs.  You have increasing pain that is not controlled with medicine.  You have trouble  managing any of the tubes that have been left in place after surgery.  You have fever or chills. SEEK IMMEDIATE MEDICAL CARE IF:   You have chest pain or an irregular or rapid heartbeat.  You have dizzy episodes or faint.  You have shortness of breath or difficulty breathing.  You have persistent nausea or vomiting.  You have a rash. Document Released: 11/18/2004 Document Revised: 09/15/2013 Document Reviewed: 06/20/2013 [employer] Patient Information 2015 [employer]. This information is not intended to replace advice given to you by your health care provider. Make sure you discuss any questions you have with your health care provider.     

## 2014-12-30 ENCOUNTER — Ambulatory Visit (HOSPITAL_COMMUNITY)
Admission: RE | Admit: 2014-12-30 | Discharge: 2014-12-30 | Disposition: A | Discharge status: Home / Self Care | Payer: Medicare Other | Source: Ambulatory Visit | Attending: Internal Medicine | Admitting: Internal Medicine

## 2014-12-30 ENCOUNTER — Other Ambulatory Visit: Payer: Self-pay | Admitting: *Deleted

## 2014-12-30 DIAGNOSIS — R918 Other nonspecific abnormal finding of lung field: Secondary | ICD-10-CM | POA: Diagnosis not present

## 2014-12-30 LAB — PULMONARY FUNCTION TEST
DL/VA % pred: 88 %
DL/VA: 4.1 ml/min/mmHg/L
DLCO cor % pred: 62 %
DLCO cor: 21.03 ml/min/mmHg
DLCO unc % pred: 57 %
DLCO unc: 19.31 ml/min/mmHg
FEF 25-75 Post: 1.91 L/sec
FEF 25-75 Pre: 1.11 L/sec
FEF2575-%CHANGE-POST: 72 %
FEF2575-%PRED-POST: 113 %
FEF2575-%Pred-Pre: 65 %
FEV1-%CHANGE-POST: 14 %
FEV1-%Pred-Post: 77 %
FEV1-%Pred-Pre: 67 %
FEV1-Post: 2.07 L
FEV1-Pre: 1.81 L
FEV1FVC-%CHANGE-POST: 5 %
FEV1FVC-%Pred-Pre: 100 %
FEV6-%Change-Post: 7 %
FEV6-%PRED-PRE: 72 %
FEV6-%Pred-Post: 77 %
FEV6-PRE: 2.58 L
FEV6-Post: 2.78 L
FEV6FVC-%Change-Post: 0 %
FEV6FVC-%PRED-PRE: 108 %
FEV6FVC-%Pred-Post: 107 %
FVC-%CHANGE-POST: 8 %
FVC-%PRED-POST: 72 %
FVC-%Pred-Pre: 66 %
FVC-POST: 2.8 L
FVC-Pre: 2.58 L
PRE FEV6/FVC RATIO: 100 %
Post FEV1/FVC ratio: 74 %
Post FEV6/FVC ratio: 99 %
Pre FEV1/FVC ratio: 70 %
RV % PRED: 96 %
RV: 2.77 L
TLC % pred: 75 %
TLC: 5.46 L

## 2014-12-30 MED ORDER — ALBUTEROL SULFATE (2.5 MG/3ML) 0.083% IN NEBU
2.5000 mg | INHALATION_SOLUTION | Freq: Once | RESPIRATORY_TRACT | Status: AC
  Administered 2014-12-30: 2.5 mg via RESPIRATORY_TRACT

## 2015-01-06 ENCOUNTER — Encounter (INDEPENDENT_AMBULATORY_CARE_PROVIDER_SITE_OTHER): Payer: Medicare Other | Admitting: Ophthalmology

## 2015-01-06 DIAGNOSIS — E11319 Type 2 diabetes mellitus with unspecified diabetic retinopathy without macular edema: Secondary | ICD-10-CM

## 2015-01-06 DIAGNOSIS — H3531 Nonexudative age-related macular degeneration: Secondary | ICD-10-CM

## 2015-01-06 DIAGNOSIS — H35033 Hypertensive retinopathy, bilateral: Secondary | ICD-10-CM | POA: Diagnosis not present

## 2015-01-06 DIAGNOSIS — I10 Essential (primary) hypertension: Secondary | ICD-10-CM | POA: Diagnosis not present

## 2015-01-06 DIAGNOSIS — E11329 Type 2 diabetes mellitus with mild nonproliferative diabetic retinopathy without macular edema: Secondary | ICD-10-CM | POA: Diagnosis not present

## 2015-01-06 DIAGNOSIS — H43813 Vitreous degeneration, bilateral: Secondary | ICD-10-CM

## 2015-01-06 DIAGNOSIS — H3532 Exudative age-related macular degeneration: Secondary | ICD-10-CM

## 2015-01-12 ENCOUNTER — Encounter: Payer: Self-pay | Admitting: Cardiology

## 2015-01-12 ENCOUNTER — Ambulatory Visit (INDEPENDENT_AMBULATORY_CARE_PROVIDER_SITE_OTHER): Payer: Medicare Other | Admitting: Cardiology

## 2015-01-12 VITALS — BP 174/60 | HR 73 | Ht 71.5 in | Wt 181.5 lb

## 2015-01-12 DIAGNOSIS — I2510 Atherosclerotic heart disease of native coronary artery without angina pectoris: Secondary | ICD-10-CM | POA: Diagnosis not present

## 2015-01-12 DIAGNOSIS — Z01810 Encounter for preprocedural cardiovascular examination: Secondary | ICD-10-CM | POA: Diagnosis not present

## 2015-01-12 NOTE — Patient Instructions (Addendum)
Your physician has requested that you have a lexiscan myoview ASAP. For further information please visit [email or web address]. Please follow instruction sheet, as given.  Your physician recommends that you schedule a follow-up appointment with Dr. Stanford Breed - 6-8 weeks.

## 2015-01-12 NOTE — Progress Notes (Signed)
Cardiology Office Note   Date:  01/12/2015   ID:  SHON INDELICATO, DOB Jul 02, 1924, MRN [id number]  PCP:  Myriam Jacobson, MD  Cardiologist:  Dr. Stanford Breed   Chief Complaint  Patient presents with  . Medical Clearance    Dr. Servando Snare surgical clearance; occasional dizziness when changing positions (not new);       History of Present Illness: Garrett Henson is a 81 y.o. male who presents for surgical risk eval with Hx of CAD, CVA and now with stage I  lung cancer with plans for VATs VS. Wedge resection.  Has seen Dr. Servando Snare.  Pt has not been seen since 2010. On recent CT scan + coronary atherosclerosis.  Hx of positive stress test leading to cardiac cath in 2010 with diffuse RCA disease of 30-40%, LCX 50%, 1st OM 60-70%, LAD 30-40% 1st diag 60%.  EF 50-55%.  Echo in 2012 after CVA with EF 55-60%. G1DD.    Pt denies chest pain or DOE.  He vacuums his home 2-3 rooms at a time this must rest for an hour or so before he does more, due to fatigue.   No palpitations or skipped beats.  occ lightheadedness he has had for years.  + HTN, Hyperlipidemia treated and DM-2 no insulin oral agents.   Past Medical History  Diagnosis Date  . Diabetes mellitus   . Coronary artery disease   . Prostate cancer   . Lung mass 12/01/2014  . Lung mass 12/01/2014    Past Surgical History  Procedure Laterality Date  . Prostatectomy    . Hip surgery       Current Outpatient Prescriptions  Medication Sig Dispense Refill  . amLODipine (NORVASC) 5 MG tablet Take 5 mg by mouth daily.    Marland Kitchen aspirin 81 MG tablet Take 81 mg by mouth daily.    Marland Kitchen BESIVANCE 0.6 % SUSP Place 1 drop into both eyes 4 (four) times daily. For two days after monthly    . Butalbital-APAP-Caffeine (FIORICET) 50-300-40 MG CAPS Take 1 tablet by mouth 2 (two) times daily.    . cimetidine (TAGAMET) 400 MG tablet Take 400 mg by mouth 2 (two) times daily.    . clopidogrel (PLAVIX) 75 MG tablet Take 75 mg by mouth daily.    .  fish oil-omega-3 fatty acids 1000 MG capsule Take 1 g by mouth every morning.      . meclizine (ANTIVERT) 25 MG tablet Take 25 mg by mouth 3 (three) times daily as needed for dizziness.    . metFORMIN (GLUCOPHAGE) 1000 MG tablet Take 1,000 mg by mouth 2 (two) times daily with a meal.      . Multiple Vitamins-Minerals (MULTIVITAMINS THER. W/MINERALS) TABS Take 1 tablet by mouth every morning.      Marland Kitchen MYRBETRIQ 50 MG TB24 tablet Take 50 mg by mouth daily.    . nitroGLYCERIN (NITROSTAT) 0.4 MG SL tablet Place 0.4 mg under the tongue every 5 (five) minutes as needed. FOR CHEST PAIN     . PHYTOSTEROL ESTERS PO Take 900 mg by mouth daily.    . rosuvastatin (CRESTOR) 10 MG tablet Take 10 mg by mouth daily.    [facility] ophthalmic ointment Place 1 application into the left eye 3 (three) times daily. For 10 days  1  . triamcinolone cream (KENALOG) 0.1 %      No current facility-administered medications for this visit.    Allergies:   Ivp dye and Toviaz  Social History:  The patient  reports that he quit smoking about 55 years ago. He does not have any smokeless tobacco history on file. He reports that he does not drink alcohol or use illicit drugs.   Family History:  The patient's family history includes Coronary artery disease in an other family member; Deep vein thrombosis in an other family member; Diabetes in an other family member; Hyperlipidemia in an other family member; Hypertension in an other family member; Stroke in an other family member.    ROS:  General:no colds or fevers, some weight gain Skin:no rashes or ulcers HEENT:no blurred vision, no congestion CV:see HPI PUL:see HPI GI:no diarrhea constipation or melena, no indigestion GU:no hematuria, no dysuria MS:no joint pain, no claudication Neuro:no syncope, + lightheadedness on occ, chronic Endo:+ diabetes, no thyroid disease  Wt Readings from Last 3 Encounters:  01/12/15 181 lb 8 oz (82.328 kg)  12/29/14 172 lb (78.019 kg)    12/23/14 180 lb 8 oz (81.874 kg)     PHYSICAL EXAM: VS:  BP 174/60 mmHg  Pulse 73  Ht 5' 11.5" (1.816 m)  Wt 181 lb 8 oz (82.328 kg)  BMI 24.96 kg/m2 , BMI Body mass index is 24.96 kg/(m^2). General:Pleasant affect, NAD Skin:Warm and dry, brisk capillary refill HEENT:normocephalic, sclera clear, mucus membranes moist Neck:supple, no JVD, no bruits  Heart:S1S2 RRR without murmur, gallup, rub or click Lungs:clear without rales, rhonchi, or wheezes IDP:OEUM, non tender, + BS, do not palpate liver spleen or masses Ext:no lower ext edema, 2+ pedal pulses, 2+ radial pulses Neuro:alert and oriented X 3, MAE, follows commands, + facial symmetry    EKG:  EKG is ordered today. The ekg ordered today demonstrates SR no changes from 2012.   Recent Labs: 12/01/2014: ALT 16; BUN 16.0; Creatinine 0.9; HGB 12.0*; Platelets 249; Potassium 4.4; Sodium 137    Lipid Panel    Component Value Date/Time   CHOL 145 04/23/2011 0355   TRIG 77 04/23/2011 0355   HDL 59 04/23/2011 0355   CHOLHDL 2.5 04/23/2011 0355   VLDL 15 04/23/2011 0355   LDLCALC 71 04/23/2011 0355       Other studies Reviewed: Additional studies/ records that were reviewed today include: see above, and previous notes.   ASSESSMENT AND PLAN:  1.  Surgical risk eval with hx of CAD, last cath 2010.  Treated medically with statin.  Due to functional status will need to risk stratify with lexiscan myoview.  This week if possible.  2. Hyperlipidemia followed by PCP on crestor.  3. Lung Cancer with need for surgery  4. DM-2 on oral agents.  5. HTN elevated today.  On amlodipine 5 mg.   Dr. Stanford Breed to evaluate for further recommendations.       Current medicines are reviewed with the patient today.  The patient Has no concerns regarding medicines.  The following changes have been made:  See above Labs/ tests ordered today include:see above  Disposition:   FU:  see above  Signed, Isaiah Serge, NP  01/12/2015  10:06 AM    [facility] [place], [place], [address] [place], [place] Phone: [phone]; Fax: [phone]  As above, patient seen and examined. Briefly he is an 81 year old male with past medical history of coronary artery disease, diabetes mellitus, hypertension and hyperlipidemia who we are asked to evaluate preoperatively prior to lung resection for lung cancer. Patient has limited mobility because  of arthritis. He notes some dyspnea on exertion but no orthopnea, PND, pedal edema or chest pain. Given multiple risk factors and history of coronary disease we will plan to proceed with a [place] nuclear study for risk stratification. If normal or low risk he may proceed with surgery. Continue aspirin and statin. Hold Plavix prior to surgery. And follow-up carotid Dopplers for history of moderate cerebrovascular disease. Kirk Ruths

## 2015-01-13 ENCOUNTER — Telehealth (HOSPITAL_COMMUNITY): Payer: Self-pay | Admitting: Radiology

## 2015-01-13 NOTE — Telephone Encounter (Signed)
Encounter complete. 

## 2015-01-14 NOTE — Telephone Encounter (Signed)
Encounter complete. 

## 2015-01-15 ENCOUNTER — Ambulatory Visit (HOSPITAL_COMMUNITY)
Admission: RE | Admit: 2015-01-15 | Discharge: 2015-01-15 | Disposition: A | Discharge status: Home / Self Care | Payer: Medicare Other | Source: Ambulatory Visit | Attending: Cardiovascular Disease | Admitting: Cardiovascular Disease

## 2015-01-15 DIAGNOSIS — F17200 Nicotine dependence, unspecified, uncomplicated: Secondary | ICD-10-CM | POA: Diagnosis not present

## 2015-01-15 DIAGNOSIS — I2510 Atherosclerotic heart disease of native coronary artery without angina pectoris: Secondary | ICD-10-CM

## 2015-01-15 DIAGNOSIS — R5383 Other fatigue: Secondary | ICD-10-CM | POA: Insufficient documentation

## 2015-01-15 DIAGNOSIS — E119 Type 2 diabetes mellitus without complications: Secondary | ICD-10-CM | POA: Insufficient documentation

## 2015-01-15 DIAGNOSIS — Z8249 Family history of ischemic heart disease and other diseases of the circulatory system: Secondary | ICD-10-CM | POA: Diagnosis not present

## 2015-01-15 DIAGNOSIS — Z01810 Encounter for preprocedural cardiovascular examination: Secondary | ICD-10-CM

## 2015-01-15 DIAGNOSIS — R9439 Abnormal result of other cardiovascular function study: Secondary | ICD-10-CM | POA: Insufficient documentation

## 2015-01-15 LAB — MYOCARDIAL PERFUSION IMAGING
CHL CUP RESTING HR STRESS: 75 {beats}/min
CSEPPHR: 97 {beats}/min
LV dias vol: 85 mL
LVSYSVOL: 29 mL
NUC STRESS TID: 1.05
SDS: 2
SRS: 3
SSS: 5

## 2015-01-15 MED ORDER — TECHNETIUM TC 99M SESTAMIBI GENERIC - CARDIOLITE
10.6000 | Freq: Once | INTRAVENOUS | Status: AC | PRN
  Administered 2015-01-15: 11 via INTRAVENOUS

## 2015-01-15 MED ORDER — TECHNETIUM TC 99M SESTAMIBI GENERIC - CARDIOLITE
32.1000 | Freq: Once | INTRAVENOUS | Status: AC | PRN
  Administered 2015-01-15: 32.1 via INTRAVENOUS

## 2015-01-15 MED ORDER — REGADENOSON 0.4 MG/5ML IV SOLN
0.4000 mg | Freq: Once | INTRAVENOUS | Status: AC
  Administered 2015-01-15: 0.4 mg via INTRAVENOUS

## 2015-01-20 ENCOUNTER — Encounter (INDEPENDENT_AMBULATORY_CARE_PROVIDER_SITE_OTHER): Payer: Medicare Other | Admitting: Ophthalmology

## 2015-01-20 DIAGNOSIS — H3532 Exudative age-related macular degeneration: Secondary | ICD-10-CM

## 2015-01-21 ENCOUNTER — Ambulatory Visit (INDEPENDENT_AMBULATORY_CARE_PROVIDER_SITE_OTHER): Payer: Medicare Other | Admitting: Cardiothoracic Surgery

## 2015-01-21 ENCOUNTER — Other Ambulatory Visit: Payer: Self-pay | Admitting: *Deleted

## 2015-01-21 ENCOUNTER — Encounter: Payer: Self-pay | Admitting: Cardiothoracic Surgery

## 2015-01-21 VITALS — BP 166/74 | HR 66 | Resp 16 | Ht 71.5 in | Wt 181.5 lb

## 2015-01-21 DIAGNOSIS — R911 Solitary pulmonary nodule: Secondary | ICD-10-CM

## 2015-01-21 DIAGNOSIS — D381 Neoplasm of uncertain behavior of trachea, bronchus and lung: Secondary | ICD-10-CM

## 2015-01-21 DIAGNOSIS — I2510 Atherosclerotic heart disease of native coronary artery without angina pectoris: Secondary | ICD-10-CM | POA: Diagnosis not present

## 2015-01-21 NOTE — Patient Instructions (Signed)
Stop Plavix today  Stop Fish oil        Lung Resection A lung resection is a procedure to remove part or all of a lung. When an entire lung is removed, the procedure is called a pneumonectomy. When only part of a lung is removed, the procedure is called a lobectomy. A lung resection is typically done to get rid of a tumor or cancer, but it may be done to treat other conditions. This procedure can help relieve some or all of your symptoms and can also help keep the problem from getting worse. Lung resection may provide the best chance for curing your disease. However, the procedure may not necessarily cure lung cancer if that is the problem.  LET [facility] CARE PROVIDER KNOW ABOUT:   Any allergies you have.  All medicines you are taking, including vitamins, herbs, eye drops, creams, and over-the-counter medicines.  Previous problems you or members of your family have had with the use of anesthetics.  Any blood disorders you have.  Previous surgeries you have had.  Medical conditions you have. RISKS AND COMPLICATIONS  Generally, lung resection is a safe procedure. However, problems can occur and include:  Excessive bleeding.  Infection.  Inability to breathe without a ventilator.  Persistent shortness of breath.  Heart problems, including abnormal rhythms and a risk of heart attack or heart failure.  Blood clots.  Injury to a blood vessel.  Injury to a nerve.  Failure to heal properly.  Stroke.  Bronchopleural fistula. This is a small hole between one of the main breathing tubes (bronchus) and the lining of the lungs. This is rare.  Reaction to anesthesia. BEFORE THE PROCEDURE  You may have tests done before the procedure, including:  Blood tests.  Urine tests.  X-rays.  Other imaging tests (such as CT scans, MRI scans, and PET scans). These tests are done to find the exact size and location of the condition being treated with this surgery.  Pulmonary  function tests. These are breathing tests to assess the function of your lungs before surgery and to decide how to best help your breathing after surgery.  Heart testing. This is done to make sure your heart is strong enough for the procedure.  Bronchoscopy. This is a technique that allows your health care provider to look at the inside of your airways. This is done using a soft, flexible tube (bronchoscope). Along with imaging tests, this can help your health care provider know the exact location and size of the area that will be removed during surgery.  Lymph node sampling. This may need to be done to see if the tumor has spread. It may be done as a separate surgery or right before your lung resection procedure. PROCEDURE  An IV tube will be placed in your arm. You will be given a medicine that makes you fall asleep (general anesthetic). You may also get pain medicine through a thin, flexible tube (catheter) in your back.  A breathing tube will be placed in your throat.  Once the surgical team has prepared you for surgery, your surgeon will make an incision on your side. Some resections are done through large incisions, while others can be done through small incisions using smaller instruments and assisted with small cameras (laparoscopic surgery).  Your surgeon will carefully cut the veins, arteries, and bronchus leading to your lung. After being cut, each of these pieces will be sewn or stapled closed. The lung or part of the lung  will then be removed.  Your surgeon will check inside your chest to make sure there is no bleeding in or around the lungs. Lymph nodes near the lung may also be removed for later tests.  Your surgeon may put tubes into your chest to drain extra fluid and air after surgery.  Your incision will be closed. This may be done using:  Stitches that absorb into your body and do not need to be removed.  Stitches that must be removed.  Staples that must be  removed. AFTER THE PROCEDURE   You will be taken to the recovery area and your progress will be monitored. You may still have a breathing tube and other tubes or catheters in your body immediately after surgery. These will be removed during your recovery. You may be put on a respirator following surgery if some assistance is needed to help your breathing. When you are awake and not experiencing immediate problems from surgery, you will be moved to the intensive care unit (ICU) where you will continue your recovery.  You may feel pain in your chest and throat. Sometimes during recovery, patients may shiver or feel nauseous. You will be given medicine to help with pain and nausea.  The breathing tube will be taken out as soon as your health care providers feel you can breathe on your own. For most people, this happens on the same day as the surgery.  If your surgery and time in the ICU go well, most of the tubes and equipment will be taken out within 1-2 days after surgery. This is about how long most people stay in the ICU. You may need to stay longer, depending on how you are doing.  You should also start respiratory therapy in the ICU. This therapy uses breathing exercises to help your other lung stay healthy and get stronger.  As you improve, you will be moved to a regular hospital room for continued respiratory therapy, help with your bladder and bowels, and to continue medicines.  After your lung or part of your lung is taken out, there will be a space inside your chest. This space will often fill up with fluid over time. The amount of time this takes is different for each person.  You will receive care until you are doing well and your health care provider feels it is safe for you to go home or to transfer to an extended care facility. Document Released: 07/22/2002 Document Revised: 09/15/2013 Document Reviewed: 06/20/2013 [facility] Patient Information 2015 [place], [place]. This information is  not intended to replace advice given to you by your health care provider. Make sure you discuss any questions you have with your health care provider.Lung Resection, Care After Refer to this sheet in the next few weeks. These instructions provide you with information on caring for yourself after your procedure. Your health care provider may also give you more specific instructions. Your treatment has been planned according to current medical practices, but problems sometimes occur. Call your health care provider if you have any problems or questions after your procedure. WHAT TO EXPECT AFTER THE PROCEDURE After your procedure, it is typical to have the following:   You may feel pain in your chest and throat.  Patients may sometimes shiver or feel nauseous during recovery. HOME CARE INSTRUCTIONS  You may resume a normal diet and activities as directed by your health care provider.  Do not use any tobacco products, including cigarettes, chewing tobacco, or electronic cigarettes. If you  need help quitting, ask your health care provider.  There are many different ways to close and cover an incision, including stitches, skin glue, and adhesive strips. Follow your health care provider's instructions on:  Incision care.  Bandage (dressing) changes and removal.  Incision closure removal.  Take medicines only as directed by your health care provider.  Keep all follow-up visits as directed by your health care provider. This is important.  Try to breathe deeply and cough as directed. Holding a pillow firmly over your ribs may help with discomfort.  If you were given an incentive spirometer in the hospital, continue to use it as directed by your health care provider.  Walk as directed by your health care provider.  You may take a shower and gently wash the area of your incision with water and soap as directed by your health care provider. Do not use anything else to clean your incision except as  directed by your health care provider. Do not take baths, swim, or use a hot tub until your health care provider approves. SEEK MEDICAL CARE IF:  You notice redness, swelling, or increasing pain at the incision site.  You are bleeding at the incision site.  You see pus coming from the incision site.  You notice a bad smell coming from the incision site or bandage.  Your incision breaks open.  You cough up blood or pus, or you develop a cough that produces bad-smelling sputum.  You have pain or swelling in your legs.  You have increasing pain that is not controlled with medicine.  You have trouble managing any of the tubes that have been left in place after surgery.  You have fever or chills. SEEK IMMEDIATE MEDICAL CARE IF:   You have chest pain or an irregular or rapid heartbeat.  You have dizzy episodes or faint.  You have shortness of breath or difficulty breathing.  You have persistent nausea or vomiting.  You have a rash. Document Released: 11/18/2004 Document Revised: 09/15/2013 Document Reviewed: 06/20/2013 [facility] Patient Information 2015 [place], [place]. This information is not intended to replace advice given to you by your health care provider. Make sure you discuss any questions you have with your health care provider.

## 2015-01-21 NOTE — Progress Notes (Signed)
[address]       [place] [address]             [phone]                    Leo R Mendiola Sorrel Medical Record #[id number] Date of Birth: 1924/07/30  Referring: Curt Bears, MD Primary Care: Myriam Jacobson, MD  Chief Complaint:    Chief Complaint  Patient presents with  . Follow-up    after PFT 12/30/14 and cardiac clearance by Dr. Stanford Breed on 01/12/15    History of Present Illness:    Jacqueline Delapena Maler 81 y.o. male is seen in the office  today for a right hypermetabolic lung lesion. In mid June the patient was hauling lumber out of his yard after taking apart and old swimming pool. He developed discomfort in his left groin and was seen in the emergency room, patient notes he was told he had a hernia subsequently this is disappeared. While in emergency room a CT scan of the abdomen was performed this demonstrated a lung nodule and a formal CT scan of the chest was performed. July 29 the patient had a PET scan.  On reviewe of films from 2012 a right lung mass was present but has increased in size He discussed the findings with Dr. Julien Nordmann and was referred for consideration of surgical resection of what appears to be clinically a stage I carcinoma the lung. The patient does have a previous history of a stroke right frontal and parietal affecting his left side but he recovered very quickly. He had a myocardial infarction 4-5 years ago, treated medically. He has a history of diabetes on oral agents. He has a history of prostate cancer status post prostatectomy 20 years ago.  In spite  the patient's age he remains active working around his yard. He has a previous smoker but quit in 1961.   He denies any occupational exposure to hazardous chemicals or asbestos. 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   Zubrod Score: At the time of surgery this patient's most appropriate activity status/level should be described as: '[]'$     0    Normal activity, no symptoms '[x]'$     1    Restricted in physical strenuous activity but ambulatory, able to do out light work '[]'$     2    Ambulatory and capable of self care, unable to do work activities, up and about               >50 % of waking hours                              '[]'$     3    Only limited self care, in bed greater than 50% of waking hours '[]'$     4    Completely disabled, no self care, confined to bed or chair '[]'$     5    Moribund   Past Medical History  Diagnosis Date  . Diabetes mellitus   . Coronary artery disease   . Prostate cancer   . Lung mass 12/01/2014  . Lung mass 12/01/2014    Past Surgical History  Procedure Laterality Date  . Prostatectomy    . Hip surgery  Family History  Problem Relation Age of Onset  . Hyperlipidemia    . Hypertension    . Diabetes    . Deep vein thrombosis    . Stroke    . Coronary artery disease      Social History   Social History  . Marital Status: Married    Spouse Name: N/A  . Number of Children: N/A  . Years of Education: N/A   Occupational History  . Not on file.   Social History Main Topics  . Smoking status: Former Smoker -- 0.50 packs/day for 30 years    Quit date: 05/16/1959  . Smokeless tobacco: Not on file  . Alcohol Use: No  . Drug Use: No  . Sexual Activity: Not on file   Other Topics Concern  . Not on file   Social History Narrative    History  Smoking status  . Former Smoker -- 0.50 packs/day for 30 years  . Quit date: 05/16/1959  Smokeless tobacco  . Not on file    History  Alcohol Use No     Allergies  Allergen Reactions  . Ivp Dye [Iodinated Diagnostic Agents] Shortness Of Breath  . Toviaz [Fesoterodine Fumarate Er] Swelling    Mouth swelling    Current Outpatient Prescriptions  Medication Sig Dispense Refill  .  amLODipine (NORVASC) 5 MG tablet Take 5 mg by mouth daily.    Marland Kitchen aspirin 81 MG tablet Take 81 mg by mouth daily.    Marland Kitchen BESIVANCE 0.6 % SUSP Place 1 drop into both eyes 4 (four) times daily. For two days after monthly    . Butalbital-APAP-Caffeine (FIORICET) 50-300-40 MG CAPS Take 1 tablet by mouth 2 (two) times daily.    . cimetidine (TAGAMET) 400 MG tablet Take 400 mg by mouth 2 (two) times daily.    . clopidogrel (PLAVIX) 75 MG tablet Take 75 mg by mouth daily.    . fish oil-omega-3 fatty acids 1000 MG capsule Take 1 g by mouth every morning.      . meclizine (ANTIVERT) 25 MG tablet Take 25 mg by mouth 3 (three) times daily as needed for dizziness.    . metFORMIN (GLUCOPHAGE) 1000 MG tablet Take 1,000 mg by mouth 2 (two) times daily with a meal.      . Multiple Vitamins-Minerals (MULTIVITAMINS THER. W/MINERALS) TABS Take 1 tablet by mouth every morning.      Marland Kitchen MYRBETRIQ 50 MG TB24 tablet Take 50 mg by mouth daily.    . nitroGLYCERIN (NITROSTAT) 0.4 MG SL tablet Place 0.4 mg under the tongue every 5 (five) minutes as needed. FOR CHEST PAIN     . PHYTOSTEROL ESTERS PO Take 900 mg by mouth daily.    . rosuvastatin (CRESTOR) 10 MG tablet Take 10 mg by mouth daily.    Baird Cancer ophthalmic ointment Place 1 application into the left eye 3 (three) times daily. For 10 days  1  . triamcinolone cream (KENALOG) 0.1 %      No current facility-administered medications for this visit.      Review of Systems:     Cardiac Review of Systems: Y or N  Chest Pain [ n   ]  Resting SOB [ n  ] Exertional SOB  Blue.Reese  ]  Orthopnea [  n]   Pedal Edema Florencio.Farrier   ]    Palpitations [ n ] Syncope  [ n ]   Presyncope [ y  ]  General Review of Systems: [Y] =  yes [  ]=no Constitional: recent weight change [n  ];  Wt loss over the last 3 months [   ] anorexia [  ]; fatigue Blue.Reese  ]; nausea [  ]; night sweats [n  ]; fever [n  ]; or chills [  ];          Dental: poor dentition[  ]; Last Dentist visit:   Eye : blurred vision [  ];  diplopia [   ]; vision changes [  ];  Amaurosis fugax[  ]; Resp: cough [  ];  wheezing[  ];  hemoptysis[  ]; shortness of breath[  ]; paroxysmal nocturnal dyspnea[  ]; dyspnea on exertion[  ]; or orthopnea[  ];  GI:  gallstones[  ], vomiting[  ];  dysphagia[  ]; melena[  ];  hematochezia [  ]; heartburn[  ];   Hx of  Colonoscopy[  ]; GU: kidney stones [ n ]; hematuria[ n ];   dysuria [n  ];  nocturia[n  ];  history of     obstruction [ n ]; urinary frequency [ y ]             Skin: rash, swelling[  ];, hair loss[  ];  peripheral edema[  ];  or itching[  ]; Musculosketetal: myalgias[  ];  joint swelling[  ];  joint erythema[  ];  joint pain[  ];  back pain[  ];  Heme/Lymph: bruising[ y ];  bleeding[  ];  anemia[  ];  Neuro: TIA[ n ];  headaches[ n ];  stroke[ y ];  vertigo[y  ];  seizures[  n];   paresthesias[ n ];  difficulty walking[ n ];  Psych:depression[  ]; anxiety[  ];  Endocrine: diabetes[y  ];  thyroid dysfunction[ n ];  Immunizations: Flu up to date Blue.Reese  ]; Pneumococcal up to date [ y];  Other:  Physical Exam: BP 166/74 mmHg  Pulse 66  Resp 16  Ht 5' 11.5" (1.816 m)  Wt 181 lb 8 oz (82.328 kg)  BMI 24.96 kg/m2  SpO2 98%  PHYSICAL EXAMINATION: General appearance: alert, cooperative, appears stated age and no distress Head: Normocephalic, without obvious abnormality, atraumatic Neck: no adenopathy, no carotid bruit, no JVD, supple, symmetrical, trachea midline and thyroid not enlarged, symmetric, no tenderness/mass/nodules Lymph nodes: Cervical, supraclavicular, and axillary nodes normal. Resp: clear to auscultation bilaterally Back: symmetric, no curvature. ROM normal. No CVA tenderness. Cardio: regular rate and rhythm, S1, S2 normal, no murmur, click, rub or gallop GI: soft, non-tender; bowel sounds normal; no masses,  no organomegaly Extremities: extremities normal, atraumatic, no cyanosis or edema and Homans sign is negative, no sign of DVT Neurologic: Grossly  normal  Diagnostic Studies & Laboratory data:     Recent Radiology Findings:     Nm Pet Image Initial (pi) Skull Base To Thigh  12/11/2014   CLINICAL DATA:  Initial treatment strategy for lung mass.  EXAM: NUCLEAR MEDICINE PET SKULL BASE TO THIGH  TECHNIQUE: 8.4 mCi F-18 FDG was injected intravenously. Full-ring PET imaging was performed from the skull base to thigh after the radiotracer. CT data was obtained and used for attenuation correction and anatomic localization.  FASTING BLOOD GLUCOSE:  Value: 122 mg/dl  COMPARISON:  10/30/2014  FINDINGS: NECK  No hypermetabolic lymph nodes in the neck. Mild chronic right maxillary sinusitis.  CHEST  The superior segment right lower lobe nodule measures 2.7 by 1.3 cm on image 31 series 6 and has maximum standard uptake value of  5.2.  No hypermetabolic adenopathy. Coronary, aortic arch, and branch vessel atherosclerotic vascular disease. The tiny nodules referenced on prior CT are below PET-CT sensitive size thresholds.  ABDOMEN/PELVIS  Physiologic activity in the bowel. Cholelithiasis. Aortoiliac atherosclerotic vascular disease. Otherwise unremarkable.  SKELETON  No focal hypermetabolic activity to suggest skeletal metastasis. Postoperative or posttraumatic findings along the right iliac crest. Bilateral hip hardware in place.  IMPRESSION: 1. The right lower lobe superior segment nodule is hypermetabolic, compatible with malignancy. It abuts the pleural surface but pleural invasion is not definitively shown. Assuming non-small cell lung cancer, this appearance is compatible with T1b N0 M0 disease (stage IA). 2. The other tiny nodules shown on CT are not hypermetabolic but are far below sensitive PET-CT size thresholds. These likely merit periodic chest CT follow up. 3. Atherosclerosis. 4. Cholelithiasis. 5. Chronic right maxillary sinusitis.   Electronically Signed   By: Van Clines M.D.   On: 12/11/2014 10:15     CLINICAL DATA: Right lower lobe  pulmonary nodule.  EXAM: CT CHEST WITHOUT CONTRAST  TECHNIQUE: Multidetector CT imaging of the chest was performed following the standard protocol without IV contrast.  COMPARISON: Chest x-ray dated 10/26/2014 and CT scan of the abdomen dated 10/16/2014  FINDINGS: There is a 28 x 28 mm mass in the 18 x 18 mm spiculated mass in the posterior superior aspect of the right lower lobe consistent with a carcinoma of the lung.  There is a 3 mm nodule in the left upper lobe laterally on image 13 of series 6, a 2.2 mm nodule in the left lower lobe on image 33 of series 6 and 2 mm vague density in the left upper lobe on image 30 of series 6 and a 3 mm density in the left upper lobe on image 37 of series 6. There are 2 small intrapulmonary nodes in the right lung. These are not significant.  There is no appreciable hilar or mediastinal adenopathy. Heart size is normal. Extensive coronary artery calcification. Extensive calcification in the thoracic aorta.  The left lobe of the thyroid gland appears to have been removed.  Images of the upper abdomen demonstrate multiple small gallstones. Chronic accentuation of the thoracic kyphosis with an old compression fracture of T12.  IMPRESSION: 1. Spiculated 20 mm mass in the right lower lobe consistent with carcinoma of the lung. 2. Multiple tiny nodules in the left lung, indeterminate. Follow-up CT scan without contrast in 6 months recommended. 3. Cholelithiasis.   Electronically Signed  By: Lorriane Shire M.D.  On: 10/30/2014 15:43      *RADIOLOGY REPORT*  Clinical Data: Stroke  CHEST - 2 VIEW  Comparison: 04/21/2011  Findings: Hyperaeration. Ovoid opacity in the right lower lung zone has developed. Linear atelectasis at the left base. No pneumothorax. No pleural effusion. Stable T12 compression deformity. Osteopenia.  IMPRESSION: New right lower lung zone ovoid opacity. Pulmonary nodule is  not excluded. This may represent focal airspace disease. Attention on short-term follow-up.  Original Report Authenticated By: Jamas Lav, M.D.        I have independently reviewed the above radiology studies  and reviewed the findings with the patient.  PFT's FEV1 1.81  67%  DLCO 19.31  57%   6 Minute Walk Test Results  Patient:Arnav R Fleisher Date:12/29/2014   Supplemental O2 during test?none   BaselineEnd  [id number] Heartrate6280 Dyspneanoslight Fatiguenosloght O2 sat97%92% Blood pressure175/72170/80   Patient ambulated at a steady pace for a total distance of 980 feet with no stops.  Ambulation was limited primarily due to nothing  Overall the test was tolerated well     Recent Lab Findings: Lab Results  Component Value Date   WBC 6.6 12/01/2014   HGB 12.0* 12/01/2014   HCT 36.6* 12/01/2014   PLT 249 12/01/2014   GLUCOSE 184* 12/01/2014   CHOL 145 04/23/2011   TRIG 77 04/23/2011   HDL 59 04/23/2011   LDLCALC 71 04/23/2011   ALT 16 12/01/2014   AST 21 12/01/2014   NA 137 12/01/2014   K 4.4 12/01/2014   CL 101 04/21/2011   CREATININE 0.9 12/01/2014   BUN 16.0 12/01/2014   CO2 26 12/01/2014   INR 1.07 04/21/2011   HGBA1C 6.6* 04/23/2011      Assessment / Plan:   81 years old white male with questionable stage I (T1a, N0, M0) non-small cell lung cancer presenting with right lower lobe pulmonary nodule. The patient also has multiple tiny nodules in the  left lung that are nonspecific and could present inflammatory lesions but metastatic disease could not be excluded. I have discussed the likely diagnosis of lung cancer with the patient and his wife treatment options including surgical resection possibly video-assisted thoracoscopy and wedge resection versus stereotactic radiotherapy were discussed with the patient. Pulmonary function completed, the patient by history is not significant limited shortness of breath.  He does have a history of diabetes, stroke, coronary occlusive disease. He has had   cardiology clearance from a cardiac standpoint.      Atherosclerosis of coronary arteries, noted on ct of chest  Cholelithiasis.   I have discussed further the options with the patient and he wishes to proceed with resection. Will plane for September 13,2016   [name] MD      [address] [place] [address] Office [phone]   Beeper [phone]  01/21/2015 1:04 PM

## 2015-01-22 ENCOUNTER — Ambulatory Visit (HOSPITAL_COMMUNITY)
Admission: RE | Admit: 2015-01-22 | Discharge: 2015-01-22 | Disposition: A | Discharge status: Home / Self Care | Payer: Medicare Other | Source: Ambulatory Visit | Attending: Cardiothoracic Surgery | Admitting: Cardiothoracic Surgery

## 2015-01-22 ENCOUNTER — Encounter (HOSPITAL_COMMUNITY)
Admission: RE | Admit: 2015-01-22 | Discharge: 2015-01-22 | Disposition: A | Discharge status: Home / Self Care | Payer: Medicare Other | Source: Ambulatory Visit | Attending: Cardiothoracic Surgery | Admitting: Cardiothoracic Surgery

## 2015-01-22 ENCOUNTER — Encounter (HOSPITAL_COMMUNITY): Payer: Self-pay

## 2015-01-22 VITALS — BP 128/76 | HR 86 | Temp 98.1°F | Resp 18 | Ht 71.5 in | Wt 183.7 lb

## 2015-01-22 DIAGNOSIS — K219 Gastro-esophageal reflux disease without esophagitis: Secondary | ICD-10-CM | POA: Diagnosis not present

## 2015-01-22 DIAGNOSIS — Z8673 Personal history of transient ischemic attack (TIA), and cerebral infarction without residual deficits: Secondary | ICD-10-CM | POA: Insufficient documentation

## 2015-01-22 DIAGNOSIS — I2510 Atherosclerotic heart disease of native coronary artery without angina pectoris: Secondary | ICD-10-CM | POA: Diagnosis not present

## 2015-01-22 DIAGNOSIS — R911 Solitary pulmonary nodule: Secondary | ICD-10-CM | POA: Diagnosis not present

## 2015-01-22 DIAGNOSIS — E785 Hyperlipidemia, unspecified: Secondary | ICD-10-CM | POA: Insufficient documentation

## 2015-01-22 DIAGNOSIS — Z0183 Encounter for blood typing: Secondary | ICD-10-CM | POA: Diagnosis not present

## 2015-01-22 DIAGNOSIS — Z87891 Personal history of nicotine dependence: Secondary | ICD-10-CM | POA: Diagnosis not present

## 2015-01-22 DIAGNOSIS — I10 Essential (primary) hypertension: Secondary | ICD-10-CM | POA: Diagnosis not present

## 2015-01-22 DIAGNOSIS — Z01818 Encounter for other preprocedural examination: Secondary | ICD-10-CM | POA: Diagnosis not present

## 2015-01-22 DIAGNOSIS — Z01812 Encounter for preprocedural laboratory examination: Secondary | ICD-10-CM | POA: Diagnosis not present

## 2015-01-22 DIAGNOSIS — Z7902 Long term (current) use of antithrombotics/antiplatelets: Secondary | ICD-10-CM | POA: Insufficient documentation

## 2015-01-22 DIAGNOSIS — E119 Type 2 diabetes mellitus without complications: Secondary | ICD-10-CM | POA: Insufficient documentation

## 2015-01-22 DIAGNOSIS — Z8546 Personal history of malignant neoplasm of prostate: Secondary | ICD-10-CM | POA: Insufficient documentation

## 2015-01-22 DIAGNOSIS — H3530 Unspecified macular degeneration: Secondary | ICD-10-CM | POA: Diagnosis not present

## 2015-01-22 HISTORY — DX: Effusion, unspecified joint: M25.40

## 2015-01-22 HISTORY — DX: Hyperlipidemia, unspecified: E78.5

## 2015-01-22 HISTORY — DX: Transient cerebral ischemic attack, unspecified: G45.9

## 2015-01-22 HISTORY — DX: Urgency of urination: R39.15

## 2015-01-22 HISTORY — DX: Dizziness and giddiness: R42

## 2015-01-22 HISTORY — DX: Nocturia: R35.1

## 2015-01-22 HISTORY — DX: Frequency of micturition: R35.0

## 2015-01-22 HISTORY — DX: Gastro-esophageal reflux disease without esophagitis: K21.9

## 2015-01-22 HISTORY — DX: Personal history of colon polyps, unspecified: Z86.0100

## 2015-01-22 HISTORY — DX: Unspecified macular degeneration: H35.30

## 2015-01-22 HISTORY — DX: Unspecified osteoarthritis, unspecified site: M19.90

## 2015-01-22 HISTORY — DX: Personal history of other diseases of the nervous system and sense organs: Z86.69

## 2015-01-22 HISTORY — DX: Dorsalgia, unspecified: M54.9

## 2015-01-22 HISTORY — DX: Overactive bladder: N32.81

## 2015-01-22 HISTORY — DX: Personal history of colonic polyps: Z86.010

## 2015-01-22 HISTORY — DX: Essential (primary) hypertension: I10

## 2015-01-22 HISTORY — DX: Personal history of other medical treatment: Z92.89

## 2015-01-22 LAB — CBC
HCT: 35.9 % — ABNORMAL LOW (ref 39.0–52.0)
Hemoglobin: 12.2 g/dL — ABNORMAL LOW (ref 13.0–17.0)
MCH: 27.5 pg (ref 26.0–34.0)
MCHC: 34 g/dL (ref 30.0–36.0)
MCV: 81 fL (ref 78.0–100.0)
Platelets: 228 10*3/uL (ref 150–400)
RBC: 4.43 MIL/uL (ref 4.22–5.81)
RDW: 13.8 % (ref 11.5–15.5)
WBC: 6.2 10*3/uL (ref 4.0–10.5)

## 2015-01-22 LAB — URINE MICROSCOPIC-ADD ON

## 2015-01-22 LAB — COMPREHENSIVE METABOLIC PANEL
ALT: 19 U/L (ref 17–63)
AST: 27 U/L (ref 15–41)
Albumin: 3.9 g/dL (ref 3.5–5.0)
Alkaline Phosphatase: 82 U/L (ref 38–126)
Anion gap: 11 (ref 5–15)
BUN: 14 mg/dL (ref 6–20)
CO2: 24 mmol/L (ref 22–32)
Calcium: 9.2 mg/dL (ref 8.9–10.3)
Chloride: 102 mmol/L (ref 101–111)
Creatinine, Ser: 0.92 mg/dL (ref 0.61–1.24)
GFR calc Af Amer: >60 mL/min (ref >60)
GFR calc non Af Amer: >60 mL/min (ref >60)
Glucose, Bld: 135 mg/dL — ABNORMAL HIGH (ref 65–99)
Potassium: 3.9 mmol/L (ref 3.5–5.1)
Sodium: 137 mmol/L (ref 135–145)
Total Bilirubin: 0.5 mg/dL (ref 0.3–1.2)
Total Protein: 6.9 g/dL (ref 6.5–8.1)

## 2015-01-22 LAB — GLUCOSE, CAPILLARY: Glucose-Capillary: 128 mg/dL — ABNORMAL HIGH (ref 65–99)

## 2015-01-22 LAB — BLOOD GAS, ARTERIAL
Acid-Base Excess: 1 mmol/L (ref 0.0–2.0)
Bicarbonate: 24.6 mEq/L — ABNORMAL HIGH (ref 20.0–24.0)
Drawn by: 421801
FIO2: 0.21
O2 Saturation: 97.2 %
Patient temperature: 98.6
TCO2: 25.7 mmol/L (ref 0–100)
pCO2 arterial: 35.5 mmHg (ref 35.0–45.0)
pH, Arterial: 7.454 — ABNORMAL HIGH (ref 7.350–7.450)
pO2, Arterial: 85.2 mmHg (ref 80.0–100.0)

## 2015-01-22 LAB — URINALYSIS, ROUTINE W REFLEX MICROSCOPIC
Bilirubin Urine: NEGATIVE
Glucose, UA: NEGATIVE mg/dL
Hgb urine dipstick: NEGATIVE
Ketones, ur: NEGATIVE mg/dL
Nitrite: NEGATIVE
Protein, ur: NEGATIVE mg/dL
Specific Gravity, Urine: 1.023 (ref 1.005–1.030)
Urobilinogen, UA: 1 mg/dL (ref 0.0–1.0)
pH: 6.5 (ref 5.0–8.0)

## 2015-01-22 LAB — SURGICAL PCR SCREEN
MRSA, PCR: NEGATIVE
Staphylococcus aureus: NEGATIVE

## 2015-01-22 LAB — APTT: aPTT: 29 seconds (ref 24–37)

## 2015-01-22 LAB — PROTIME-INR
INR: 1.05 (ref 0.00–1.49)
Prothrombin Time: 13.9 seconds (ref 11.6–15.2)

## 2015-01-22 NOTE — Progress Notes (Addendum)
Cardiologist is Dr.Crenshaw and was seen in the office 01/12/15  Medical Md is Dr.Ronald Mancel Bale  Echo report in epic from 2012  Stress test report in epic from 2016  EKG in epic from 01-12-15  Heart cath denies ever having one

## 2015-01-22 NOTE — Pre-Procedure Instructions (Signed)
Garrett Henson  01/22/2015      [id number] [place], [place] [address] [place] [address] Phone: [phone] Fax: [phone]    Your procedure is scheduled on Tues, Sept 13 @ 7:30 AM  Report to [facility] Admitting at 5:30 AM  Call this number if you have problems the morning of surgery:  [phone]   Remember:  Do not eat food or drink liquids after midnight.  Take these medicines the morning of surgery with A SIP OF WATER Amlodipine(Norvasc),Tagamet(Cimetidine),Antivert(Meclizine),and Myrbetriq              Stop taking your Fish Oil,Plavix,and Aspirin. No Goody's,BC's,Aleve,Ibuprofen,Fish Oil,or any Herbal Medications.                How to Manage Your Diabetes Before Surgery   Why is it important to control my blood sugar before and after surgery?   Improving blood sugar levels before and after surgery helps healing and can limit problems.  A way of improving blood sugar control is eating a healthy diet by:  - Eating less sugar and carbohydrates  - Increasing activity/exercise  - Talk with your doctor about reaching your blood sugar goals  High blood sugars (greater than 180 mg/dL) can raise your risk of infections and slow down your recovery so you will need to focus on controlling your diabetes during the weeks before surgery.  Make sure that the doctor who takes care of your diabetes knows about your planned surgery including the date and location.  How do I manage my blood sugars before surgery?   Check your blood sugar at least 4 times a day, 2 days before surgery to make sure that they are not too high or low.   Check your blood sugar the morning of your surgery when you wake up and every 2               hours until you get to the Short-Stay unit.  If your blood sugar is less than 70 mg/dL, you will need to treat for low blood sugar by:  Treat a low blood sugar (less than 70 mg/dL) with 1/2 cup of  clear juice (cranberry or apple), 4 glucose tablets, OR glucose gel.  Recheck blood sugar in 15 minutes after treatment (to make sure it is greater than 70 mg/dL).  If blood sugar is not greater than 70 mg/dL on re-check, call [phone] for further instructions.   Report your blood sugar to the Short-Stay nurse when you get to Short-Stay.  References:  [facility], 2007 "How to Manage your Diabetes Before and After Surgery".  What do I do about my diabetes medications?   Do not take oral diabetes medicines (pills) the morning of surgery.  .    THE MORNING OF SURGERY, NO METFORMIN    Do not take other diabetes injectables the day of surgery including Byetta, Victoza, Bydureon, and Trulicity.    If your CBG is greater than 220 mg/dL, you may take 1/2 of your sliding scale (correction) dose of insulin.   For patients with "Insulin Pumps":  Contact your diabetes doctor for specific instructions before surgery.   Decrease basal insulin rates by 20% at midnight the night before surgery.  Note that if your surgery is planned to be longer than 2 hours, your insulin pump will be removed and intravenous (IV) insulin will be started and managed by the nurses  and anesthesiologist.  You will be able to restart your insulin pump once you are awake and able to manage it.  Make sure to bring insulin pump supplies to the hospital with you in case your site needs to be changed.         Do not wear jewelry.  Do not wear lotions, powders, or colognes.    Men may shave face and neck.  Do not bring valuables to the hospital.  [facility] is not responsible for any belongings or valuables.  Contacts, dentures or bridgework may not be worn into surgery.  Leave your suitcase in the car.  After surgery it may be brought to your room.  For patients admitted to the hospital, discharge time will be determined by your treatment team.  Patients discharged the day  of surgery will not be allowed to drive home.    Special instructions:  [place] - Preparing for Surgery  Before surgery, you can play an important role.  Because skin is not sterile, your skin needs to be as free of germs as possible.  You can reduce the number of germs on you skin by washing with CHG (chlorahexidine gluconate) soap before surgery.  CHG is an antiseptic cleaner which kills germs and bonds with the skin to continue killing germs even after washing.  Please DO NOT use if you have an allergy to CHG or antibacterial soaps.  If your skin becomes reddened/irritated stop using the CHG and inform your nurse when you arrive at Short Stay.  Do not shave (including legs and underarms) for at least 48 hours prior to the first CHG shower.  You may shave your face.  Please follow these instructions carefully:   1.  Shower with CHG Soap the night before surgery and the                                morning of Surgery.  2.  If you choose to wash your hair, wash your hair first as usual with your       normal shampoo.  3.  After you shampoo, rinse your hair and body thoroughly to remove the                      Shampoo.  4.  Use CHG as you would any other liquid soap.  You can apply chg directly       to the skin and wash gently with scrungie or a clean washcloth.  5.  Apply the CHG Soap to your body ONLY FROM THE NECK DOWN.        Do not use on open wounds or open sores.  Avoid contact with your eyes,       ears, mouth and genitals (private parts).  Wash genitals (private parts)       with your normal soap.  6.  Wash thoroughly, paying special attention to the area where your surgery        will be performed.  7.  Thoroughly rinse your body with warm water from the neck down.  8.  DO NOT shower/wash with your normal soap after using and rinsing off       the CHG Soap.  9.  Pat yourself dry with a clean towel.            10.  Wear clean pajamas.  11.  Place clean sheets on your  bed the night of your first shower and do not        sleep with pets.  Day of Surgery  Do not apply any lotions/deoderants the morning of surgery.  Please wear clean clothes to the hospital/surgery center.    Please read over the following fact sheets that you were given. Pain Booklet, Coughing and Deep Breathing, Blood Transfusion Information, MRSA Information and Surgical Site Infection Prevention

## 2015-01-23 LAB — ABO/RH: ABO/RH(D): A POS

## 2015-01-25 NOTE — Progress Notes (Signed)
Anesthesia Chart Review: 81 year old male scheduled for video bronch, right VATS, lung resection on 01/26/15 by Dr. Servando Snare.   History includes RLL hypermetabolic lung lesion, remote former smoker, HTN, vertigo, DM2, HLD, overactive bladder, moderate CAD (medical therapy recommended after 2010 cath), GERD, migraines, TIA '12, prostate cancer s/p prostatectomy, macular degeneration, partial thyroidectomy.  PCP is Dr. Lorene Dy. HEM-ONC is Dr. Julien Nordmann. Cardiologist is Dr. Stanford Breed. Plavix is on hold for surgery.   01/12/15 EKG: NSR  01/15/15 Nuclear stress test:  There was no ST segment deviation noted during stress.  This is a low risk study.  The left ventricular ejection fraction is hyperdynamic (>65%).  Nuclear stress EF: 66%. Low risk stress nuclear study with a small, mild, fixed inferior defect and a small, moderate intensity, partially reversible apical defect; findings consistent with inferior and apical thinning with very mild apical ischemia; EF 66 and normal wall motion. Results reviewed by Dr. Stanford Breed who stated, "Ok to proceed with surgery."  04/23/11 Echo: Study Conclusions - Left ventricle: The cavity size was normal. Systolic function was normal. The estimated ejection fraction was in the range of 55% to 60%. Wall motion was normal; there were no regional wall motion abnormalities. Doppler parameters are consistent with abnormal left ventricular relaxation (grade 1 diastolic dysfunction). - Aortic valve: Mildly calcified annulus. Mildly thickened leaflets. - Mitral valve: Calcified annulus. Mildly calcified leaflets . Mild regurgitation. - Atrial septum: No defect or patent foramen ovale was identified. Impressions: - No cardiac source of embolism was identified, but cannot be ruled out on the basis of this examination.  03/03/09 Cardiac cath (done for chest pain with Cardiolite revealing apical ischemia): Diffuse RCA disease of 30-40%, LCX 50%,  1st OM 60-70% (small vessel and not a good candidate for PCI), LAD 30-40% 1st diag 60% (small vessel and not a good candidate for PCI). EF 50-55%. Conclusions: Moderate CAD. No stenoses that are suitable for angioplasty. Medical therapy recommended.  04/23/11 Carotid duplex:  Summary: - Right - There is a 40% to 59% low range ICA stenosis. Moderate ECA stenosis at the origin. - Left - There is a 40% to 59% upper range ICA stenosis. Moderate ECA stenosis. - Vertebrals are patent and flow is antegrade.  01/22/15 CXR: IMPRESSION: Known hypermetabolic right lower lobe pulmonary nodule. There is no active cardiopulmonary disease otherwise.  Preoperative labs noted. UA showed moderate leukocytes, negative nitrites. Labs are marked as reviewed by Dr. Servando Snare in [place].  If no acute changes then I anticipate that he can proceed as planned.  Garrett Henson [facility] Short Stay Center/Anesthesiology Phone [phone] 01/25/2015 9:54 AM

## 2015-01-26 ENCOUNTER — Inpatient Hospital Stay (HOSPITAL_COMMUNITY): Payer: Medicare Other | Admitting: Anesthesiology

## 2015-01-26 ENCOUNTER — Encounter: Payer: Self-pay | Admitting: Anatomic Pathology & Clinical Pathology

## 2015-01-26 ENCOUNTER — Inpatient Hospital Stay (HOSPITAL_COMMUNITY)
Admission: RE | Admit: 2015-01-26 | Discharge: 2015-01-30 | DRG: 164 | Disposition: A | Discharge status: Home Health | Payer: Medicare Other | Source: Ambulatory Visit | Attending: Cardiothoracic Surgery | Admitting: Cardiothoracic Surgery

## 2015-01-26 ENCOUNTER — Inpatient Hospital Stay (HOSPITAL_COMMUNITY): Payer: Medicare Other

## 2015-01-26 ENCOUNTER — Encounter (HOSPITAL_COMMUNITY): Payer: Self-pay | Admitting: General Practice

## 2015-01-26 ENCOUNTER — Inpatient Hospital Stay (HOSPITAL_COMMUNITY): Payer: Medicare Other | Admitting: Vascular Surgery

## 2015-01-26 ENCOUNTER — Encounter (HOSPITAL_COMMUNITY)
Admission: RE | Disposition: A | Discharge status: Home Health | Payer: Self-pay | Source: Ambulatory Visit | Attending: Cardiothoracic Surgery

## 2015-01-26 DIAGNOSIS — I82501 Chronic embolism and thrombosis of unspecified deep veins of right lower extremity: Secondary | ICD-10-CM | POA: Diagnosis present

## 2015-01-26 DIAGNOSIS — Z9889 Other specified postprocedural states: Secondary | ICD-10-CM

## 2015-01-26 DIAGNOSIS — K219 Gastro-esophageal reflux disease without esophagitis: Secondary | ICD-10-CM | POA: Diagnosis present

## 2015-01-26 DIAGNOSIS — R911 Solitary pulmonary nodule: Secondary | ICD-10-CM

## 2015-01-26 DIAGNOSIS — R42 Dizziness and giddiness: Secondary | ICD-10-CM | POA: Diagnosis present

## 2015-01-26 DIAGNOSIS — J9811 Atelectasis: Secondary | ICD-10-CM | POA: Diagnosis not present

## 2015-01-26 DIAGNOSIS — Z8546 Personal history of malignant neoplasm of prostate: Secondary | ICD-10-CM

## 2015-01-26 DIAGNOSIS — N3281 Overactive bladder: Secondary | ICD-10-CM | POA: Diagnosis present

## 2015-01-26 DIAGNOSIS — Z8249 Family history of ischemic heart disease and other diseases of the circulatory system: Secondary | ICD-10-CM

## 2015-01-26 DIAGNOSIS — K8020 Calculus of gallbladder without cholecystitis without obstruction: Secondary | ICD-10-CM | POA: Diagnosis present

## 2015-01-26 DIAGNOSIS — Z91041 Radiographic dye allergy status: Secondary | ICD-10-CM

## 2015-01-26 DIAGNOSIS — Z888 Allergy status to other drugs, medicaments and biological substances status: Secondary | ICD-10-CM | POA: Diagnosis not present

## 2015-01-26 DIAGNOSIS — Z87891 Personal history of nicotine dependence: Secondary | ICD-10-CM | POA: Diagnosis not present

## 2015-01-26 DIAGNOSIS — I252 Old myocardial infarction: Secondary | ICD-10-CM | POA: Diagnosis not present

## 2015-01-26 DIAGNOSIS — K5900 Constipation, unspecified: Secondary | ICD-10-CM | POA: Diagnosis not present

## 2015-01-26 DIAGNOSIS — C3490 Malignant neoplasm of unspecified part of unspecified bronchus or lung: Secondary | ICD-10-CM | POA: Diagnosis present

## 2015-01-26 DIAGNOSIS — Z8673 Personal history of transient ischemic attack (TIA), and cerebral infarction without residual deficits: Secondary | ICD-10-CM | POA: Diagnosis not present

## 2015-01-26 DIAGNOSIS — Z9689 Presence of other specified functional implants: Secondary | ICD-10-CM

## 2015-01-26 DIAGNOSIS — E785 Hyperlipidemia, unspecified: Secondary | ICD-10-CM | POA: Diagnosis present

## 2015-01-26 DIAGNOSIS — C3431 Malignant neoplasm of lower lobe, right bronchus or lung: Principal | ICD-10-CM | POA: Diagnosis present

## 2015-01-26 DIAGNOSIS — Z7902 Long term (current) use of antithrombotics/antiplatelets: Secondary | ICD-10-CM | POA: Diagnosis not present

## 2015-01-26 DIAGNOSIS — Z86010 Personal history of colonic polyps: Secondary | ICD-10-CM | POA: Diagnosis not present

## 2015-01-26 DIAGNOSIS — M79669 Pain in unspecified lower leg: Secondary | ICD-10-CM

## 2015-01-26 DIAGNOSIS — I10 Essential (primary) hypertension: Secondary | ICD-10-CM | POA: Diagnosis present

## 2015-01-26 DIAGNOSIS — E119 Type 2 diabetes mellitus without complications: Secondary | ICD-10-CM | POA: Diagnosis present

## 2015-01-26 DIAGNOSIS — H3530 Unspecified macular degeneration: Secondary | ICD-10-CM | POA: Diagnosis present

## 2015-01-26 DIAGNOSIS — I2510 Atherosclerotic heart disease of native coronary artery without angina pectoris: Secondary | ICD-10-CM | POA: Diagnosis present

## 2015-01-26 DIAGNOSIS — R918 Other nonspecific abnormal finding of lung field: Secondary | ICD-10-CM | POA: Diagnosis present

## 2015-01-26 DIAGNOSIS — M79609 Pain in unspecified limb: Secondary | ICD-10-CM | POA: Diagnosis not present

## 2015-01-26 HISTORY — PX: VIDEO ASSISTED THORACOSCOPY (VATS)/WEDGE RESECTION: SHX6174

## 2015-01-26 HISTORY — PX: VIDEO BRONCHOSCOPY: SHX5072

## 2015-01-26 LAB — GLUCOSE, CAPILLARY
Glucose-Capillary: 137 mg/dL — ABNORMAL HIGH (ref 65–99)
Glucose-Capillary: 145 mg/dL — ABNORMAL HIGH (ref 65–99)
Glucose-Capillary: 173 mg/dL — ABNORMAL HIGH (ref 65–99)
Glucose-Capillary: 181 mg/dL — ABNORMAL HIGH (ref 65–99)
Glucose-Capillary: 199 mg/dL — ABNORMAL HIGH (ref 65–99)

## 2015-01-26 LAB — CBC
HCT: 30.9 % — ABNORMAL LOW (ref 39.0–52.0)
Hemoglobin: 10.2 g/dL — ABNORMAL LOW (ref 13.0–17.0)
MCH: 26.8 pg (ref 26.0–34.0)
MCHC: 33 g/dL (ref 30.0–36.0)
MCV: 81.3 fL (ref 78.0–100.0)
Platelets: 239 10*3/uL (ref 150–400)
RBC: 3.8 MIL/uL — ABNORMAL LOW (ref 4.22–5.81)
RDW: 13.7 % (ref 11.5–15.5)
WBC: 13.8 10*3/uL — ABNORMAL HIGH (ref 4.0–10.5)

## 2015-01-26 LAB — BASIC METABOLIC PANEL
Anion gap: 5 (ref 5–15)
BUN: 15 mg/dL (ref 6–20)
CO2: 23 mmol/L (ref 22–32)
Calcium: 8.2 mg/dL — ABNORMAL LOW (ref 8.9–10.3)
Chloride: 107 mmol/L (ref 101–111)
Creatinine, Ser: 0.91 mg/dL (ref 0.61–1.24)
GFR calc Af Amer: >60 mL/min (ref >60)
GFR calc non Af Amer: >60 mL/min (ref >60)
Glucose, Bld: 235 mg/dL — ABNORMAL HIGH (ref 65–99)
Potassium: 4.5 mmol/L (ref 3.5–5.1)
Sodium: 135 mmol/L (ref 135–145)

## 2015-01-26 SURGERY — BRONCHOSCOPY, VIDEO-ASSISTED
Anesthesia: General | Site: Chest | Laterality: Right

## 2015-01-26 MED ORDER — LIDOCAINE HCL (CARDIAC) 20 MG/ML IV SOLN
INTRAVENOUS | Status: AC
  Filled 2015-01-26: qty 5

## 2015-01-26 MED ORDER — MIDAZOLAM HCL 2 MG/2ML IJ SOLN
INTRAMUSCULAR | Status: AC
  Filled 2015-01-26: qty 4

## 2015-01-26 MED ORDER — LABETALOL HCL 5 MG/ML IV SOLN
INTRAVENOUS | Status: AC
  Filled 2015-01-26: qty 4

## 2015-01-26 MED ORDER — ONDANSETRON HCL 4 MG/2ML IJ SOLN: 4.0000 mg | Freq: Four times a day (QID) | INTRAMUSCULAR | Status: DC | PRN

## 2015-01-26 MED ORDER — ROSUVASTATIN CALCIUM 10 MG PO TABS
10.0000 mg | ORAL_TABLET | Freq: Every day | ORAL | Status: DC
  Administered 2015-01-26 – 2015-01-29 (×4): 10 mg via ORAL
  Filled 2015-01-26 (×6): qty 1

## 2015-01-26 MED ORDER — HEMOSTATIC AGENTS (NO CHARGE) OPTIME
TOPICAL | Status: DC | PRN
  Administered 2015-01-26: 1 via TOPICAL

## 2015-01-26 MED ORDER — MIRABEGRON ER 50 MG PO TB24
50.0000 mg | ORAL_TABLET | Freq: Every day | ORAL | Status: DC
  Administered 2015-01-27 – 2015-01-30 (×4): 50 mg via ORAL
  Filled 2015-01-26 (×5): qty 1

## 2015-01-26 MED ORDER — ROCURONIUM BROMIDE 100 MG/10ML IV SOLN
INTRAVENOUS | Status: DC | PRN
  Administered 2015-01-26 (×2): 20 mg via INTRAVENOUS
  Administered 2015-01-26: 50 mg via INTRAVENOUS

## 2015-01-26 MED ORDER — FENTANYL CITRATE (PF) 250 MCG/5ML IJ SOLN
INTRAMUSCULAR | Status: AC
  Filled 2015-01-26: qty 5

## 2015-01-26 MED ORDER — PROPOFOL 10 MG/ML IV BOLUS
INTRAVENOUS | Status: AC
  Filled 2015-01-26: qty 20

## 2015-01-26 MED ORDER — ASPIRIN EC 81 MG PO TBEC
81.0000 mg | DELAYED_RELEASE_TABLET | Freq: Every day | ORAL | Status: DC
  Administered 2015-01-27 – 2015-01-30 (×4): 81 mg via ORAL
  Filled 2015-01-26 (×5): qty 1

## 2015-01-26 MED ORDER — LACTATED RINGERS IV SOLN
INTRAVENOUS | Status: DC | PRN
  Administered 2015-01-26: 07:00:00 via INTRAVENOUS

## 2015-01-26 MED ORDER — SUGAMMADEX SODIUM 200 MG/2ML IV SOLN
INTRAVENOUS | Status: AC
  Filled 2015-01-26: qty 2

## 2015-01-26 MED ORDER — SODIUM CHLORIDE 0.9 % IJ SOLN
9.0000 mL | INTRAMUSCULAR | Status: DC | PRN
  Administered 2015-01-27 (×2): 10 mL via INTRAVENOUS
  Filled 2015-01-26 (×2): qty 9

## 2015-01-26 MED ORDER — ONDANSETRON HCL 4 MG/2ML IJ SOLN: 4.0000 mg | Freq: Once | INTRAMUSCULAR | Status: DC | PRN

## 2015-01-26 MED ORDER — POTASSIUM CHLORIDE 10 MEQ/50ML IV SOLN: 10.0000 meq | Freq: Every day | INTRAVENOUS | Status: DC | PRN

## 2015-01-26 MED ORDER — ONDANSETRON HCL 4 MG/2ML IJ SOLN
INTRAMUSCULAR | Status: AC
  Filled 2015-01-26: qty 2

## 2015-01-26 MED ORDER — FENTANYL CITRATE (PF) 100 MCG/2ML IJ SOLN
INTRAMUSCULAR | Status: AC
  Administered 2015-01-26: 25 ug via INTRAVENOUS
  Filled 2015-01-26: qty 2

## 2015-01-26 MED ORDER — SODIUM CHLORIDE 0.9 % IV SOLN
Freq: Once | INTRAVENOUS | Status: AC
  Administered 2015-01-26: 18:00:00 via INTRAVENOUS

## 2015-01-26 MED ORDER — INSULIN ASPART 100 UNIT/ML ~~LOC~~ SOLN
0.0000 [IU] | SUBCUTANEOUS | Status: DC
  Administered 2015-01-26 – 2015-01-27 (×5): 4 [IU] via SUBCUTANEOUS
  Administered 2015-01-27: 8 [IU] via SUBCUTANEOUS
  Administered 2015-01-27 – 2015-01-28 (×2): 2 [IU] via SUBCUTANEOUS
  Administered 2015-01-28: 4 [IU] via SUBCUTANEOUS
  Administered 2015-01-28 – 2015-01-30 (×7): 2 [IU] via SUBCUTANEOUS

## 2015-01-26 MED ORDER — 0.9 % SODIUM CHLORIDE (POUR BTL) OPTIME
TOPICAL | Status: DC | PRN
  Administered 2015-01-26: 1000 mL

## 2015-01-26 MED ORDER — MIDAZOLAM HCL 5 MG/5ML IJ SOLN
INTRAMUSCULAR | Status: DC | PRN
  Administered 2015-01-26: 1 mg via INTRAVENOUS
  Administered 2015-01-26: 0.5 mg via INTRAVENOUS

## 2015-01-26 MED ORDER — ONDANSETRON HCL 4 MG/2ML IJ SOLN
INTRAMUSCULAR | Status: DC | PRN
  Administered 2015-01-26: 4 mg via INTRAVENOUS

## 2015-01-26 MED ORDER — OXYCODONE HCL 5 MG PO TABS
ORAL_TABLET | ORAL | Status: AC
  Administered 2015-01-26: 10 mg via ORAL
  Filled 2015-01-26: qty 2

## 2015-01-26 MED ORDER — DEXTROSE 5 % IV SOLN
1.5000 g | Freq: Two times a day (BID) | INTRAVENOUS | Status: AC
  Administered 2015-01-26 – 2015-01-27 (×2): 1.5 g via INTRAVENOUS
  Filled 2015-01-26 (×2): qty 1.5

## 2015-01-26 MED ORDER — LIDOCAINE HCL (CARDIAC) 20 MG/ML IV SOLN
INTRAVENOUS | Status: DC | PRN
  Administered 2015-01-26: 100 mg via INTRAVENOUS

## 2015-01-26 MED ORDER — AMLODIPINE BESYLATE 5 MG PO TABS
5.0000 mg | ORAL_TABLET | Freq: Every day | ORAL | Status: DC
  Administered 2015-01-26 – 2015-01-29 (×4): 5 mg via ORAL
  Filled 2015-01-26 (×5): qty 1

## 2015-01-26 MED ORDER — OXYCODONE HCL 5 MG PO TABS
5.0000 mg | ORAL_TABLET | ORAL | Status: DC | PRN
  Administered 2015-01-26: 10 mg via ORAL

## 2015-01-26 MED ORDER — ROCURONIUM BROMIDE 50 MG/5ML IV SOLN
INTRAVENOUS | Status: AC
  Filled 2015-01-26: qty 2

## 2015-01-26 MED ORDER — ADULT MULTIVITAMIN W/MINERALS CH
1.0000 | ORAL_TABLET | Freq: Every day | ORAL | Status: DC
  Administered 2015-01-26 – 2015-01-29 (×4): 1 via ORAL
  Filled 2015-01-26 (×5): qty 1

## 2015-01-26 MED ORDER — TRAMADOL HCL 50 MG PO TABS: 50.0000 mg | ORAL_TABLET | Freq: Four times a day (QID) | ORAL | Status: DC | PRN

## 2015-01-26 MED ORDER — PHENYLEPHRINE 40 MCG/ML (10ML) SYRINGE FOR IV PUSH (FOR BLOOD PRESSURE SUPPORT)
PREFILLED_SYRINGE | INTRAVENOUS | Status: AC
  Filled 2015-01-26: qty 10

## 2015-01-26 MED ORDER — KCL IN DEXTROSE-NACL 20-5-0.45 MEQ/L-%-% IV SOLN
INTRAVENOUS | Status: DC
  Administered 2015-01-26 – 2015-01-28 (×2): via INTRAVENOUS
  Filled 2015-01-26 (×2): qty 1000

## 2015-01-26 MED ORDER — ACETAMINOPHEN 160 MG/5ML PO SOLN: 1000.0000 mg | Freq: Four times a day (QID) | ORAL | Status: DC

## 2015-01-26 MED ORDER — PROPOFOL 10 MG/ML IV BOLUS
INTRAVENOUS | Status: DC | PRN
  Administered 2015-01-26: 50 mg via INTRAVENOUS
  Administered 2015-01-26: 100 mg via INTRAVENOUS
  Administered 2015-01-26: 150 mg via INTRAVENOUS

## 2015-01-26 MED ORDER — SUGAMMADEX SODIUM 200 MG/2ML IV SOLN
INTRAVENOUS | Status: DC | PRN
  Administered 2015-01-26: 200 mg via INTRAVENOUS

## 2015-01-26 MED ORDER — DEXTROSE 5 % IV SOLN
1.5000 g | INTRAVENOUS | Status: AC
  Administered 2015-01-26: 1.5 g via INTRAVENOUS
  Filled 2015-01-26: qty 1.5

## 2015-01-26 MED ORDER — NALOXONE HCL 0.4 MG/ML IJ SOLN: 0.4000 mg | INTRAMUSCULAR | Status: DC | PRN

## 2015-01-26 MED ORDER — DIPHENHYDRAMINE HCL 12.5 MG/5ML PO ELIX
12.5000 mg | ORAL_SOLUTION | Freq: Four times a day (QID) | ORAL | Status: DC | PRN
  Filled 2015-01-26: qty 5

## 2015-01-26 MED ORDER — DEXTROSE 5 % IV SOLN
10.0000 mg | INTRAVENOUS | Status: DC | PRN
  Administered 2015-01-26: 20 ug/min via INTRAVENOUS

## 2015-01-26 MED ORDER — FENTANYL 10 MCG/ML IV SOLN
INTRAVENOUS | Status: DC
  Administered 2015-01-26: 12:00:00 via INTRAVENOUS
  Administered 2015-01-26: 78 ug via INTRAVENOUS
  Administered 2015-01-26: 60 ug via INTRAVENOUS
  Administered 2015-01-26: 100 ug via INTRAVENOUS
  Administered 2015-01-27: 120 ug via INTRAVENOUS
  Administered 2015-01-27: 200 ug via INTRAVENOUS
  Administered 2015-01-27: 250 ug via INTRAVENOUS
  Administered 2015-01-27: 10 ug via INTRAVENOUS
  Administered 2015-01-27: 06:00:00 via INTRAVENOUS
  Administered 2015-01-27: 40 ug via INTRAVENOUS
  Administered 2015-01-28: 20 ug via INTRAVENOUS
  Administered 2015-01-28: 160 ug via INTRAVENOUS
  Administered 2015-01-28: 01:00:00 via INTRAVENOUS
  Filled 2015-01-26 (×3): qty 50

## 2015-01-26 MED ORDER — ACETAMINOPHEN 500 MG PO TABS
1000.0000 mg | ORAL_TABLET | Freq: Four times a day (QID) | ORAL | Status: DC
  Administered 2015-01-26 – 2015-01-30 (×15): 1000 mg via ORAL
  Filled 2015-01-26 (×15): qty 2

## 2015-01-26 MED ORDER — DIPHENHYDRAMINE HCL 50 MG/ML IJ SOLN: 12.5000 mg | Freq: Four times a day (QID) | INTRAMUSCULAR | Status: DC | PRN

## 2015-01-26 MED ORDER — SUCCINYLCHOLINE CHLORIDE 20 MG/ML IJ SOLN
INTRAMUSCULAR | Status: AC
  Filled 2015-01-26: qty 1

## 2015-01-26 MED ORDER — FENTANYL CITRATE (PF) 100 MCG/2ML IJ SOLN
25.0000 ug | INTRAMUSCULAR | Status: DC | PRN
  Administered 2015-01-26 (×4): 25 ug via INTRAVENOUS

## 2015-01-26 MED ORDER — SENNOSIDES-DOCUSATE SODIUM 8.6-50 MG PO TABS
1.0000 | ORAL_TABLET | Freq: Every day | ORAL | Status: DC
  Administered 2015-01-26 – 2015-01-29 (×4): 1 via ORAL
  Filled 2015-01-26 (×5): qty 1

## 2015-01-26 MED ORDER — BISACODYL 5 MG PO TBEC
10.0000 mg | DELAYED_RELEASE_TABLET | Freq: Every day | ORAL | Status: DC
  Administered 2015-01-27 – 2015-01-30 (×4): 10 mg via ORAL
  Filled 2015-01-26 (×4): qty 2

## 2015-01-26 MED ORDER — FENTANYL CITRATE (PF) 100 MCG/2ML IJ SOLN
INTRAMUSCULAR | Status: DC | PRN
  Administered 2015-01-26 (×2): 50 ug via INTRAVENOUS
  Administered 2015-01-26: 25 ug via INTRAVENOUS
  Administered 2015-01-26: 50 ug via INTRAVENOUS
  Administered 2015-01-26: 75 ug via INTRAVENOUS
  Administered 2015-01-26 (×4): 50 ug via INTRAVENOUS

## 2015-01-26 SURGICAL SUPPLY — 97 items
ADH SKN CLS APL DERMABOND .7 (GAUZE/BANDAGES/DRESSINGS) ×2
APL SRG 22X2 LUM MLBL SLNT (VASCULAR PRODUCTS)
APL SRG 7X2 LUM MLBL SLNT (VASCULAR PRODUCTS)
APPLICATOR TIP COSEAL (VASCULAR PRODUCTS) IMPLANT
APPLICATOR TIP EXT COSEAL (VASCULAR PRODUCTS) IMPLANT
BAG SPEC RTRVL LRG 6X4 10 (ENDOMECHANICALS) ×2
BLADE SURG 11 STRL SS (BLADE) IMPLANT
BRUSH CYTOL CELLEBRITY 1.5X140 (MISCELLANEOUS) IMPLANT
CANISTER SUCTION 2500CC (MISCELLANEOUS) ×4 IMPLANT
CATH KIT ON Q 5IN SLV (PAIN MANAGEMENT) IMPLANT
CATH THORACIC 28FR (CATHETERS) ×2 IMPLANT
CATH THORACIC 36FR (CATHETERS) IMPLANT
CATH THORACIC 36FR RT ANG (CATHETERS) IMPLANT
CLIP TI MEDIUM 6 (CLIP) ×4 IMPLANT
CONN ST 1/4X3/8  BEN (MISCELLANEOUS)
CONN ST 1/4X3/8 BEN (MISCELLANEOUS) IMPLANT
CONT SPEC 4OZ CLIKSEAL STRL BL (MISCELLANEOUS) ×8 IMPLANT
CONT SPEC STER OR (MISCELLANEOUS) ×4 IMPLANT
COVER TABLE BACK 60X90 (DRAPES) ×4 IMPLANT
DERMABOND ADVANCED (GAUZE/BANDAGES/DRESSINGS) ×2
DERMABOND ADVANCED .7 DNX12 (GAUZE/BANDAGES/DRESSINGS) IMPLANT
DRAIN CHANNEL 28F RND 3/8 FF (WOUND CARE) IMPLANT
DRAIN CHANNEL 32F RND 10.7 FF (WOUND CARE) IMPLANT
DRAPE LAPAROSCOPIC ABDOMINAL (DRAPES) ×4 IMPLANT
DRAPE WARM FLUID 44X44 (DRAPE) ×4 IMPLANT
DRILL BIT 7/64X5 (BIT) IMPLANT
ELECT BLADE 4.0 EZ CLEAN MEGAD (MISCELLANEOUS) ×4
ELECT BLADE 6.5 EXT (BLADE) ×2 IMPLANT
ELECT REM PT RETURN 9FT ADLT (ELECTROSURGICAL) ×4
ELECTRODE BLDE 4.0 EZ CLN MEGD (MISCELLANEOUS) ×2 IMPLANT
ELECTRODE REM PT RTRN 9FT ADLT (ELECTROSURGICAL) ×2 IMPLANT
FORCEPS BIOP RJ4 1.8 (CUTTING FORCEPS) IMPLANT
GAUZE SPONGE 4X4 12PLY STRL (GAUZE/BANDAGES/DRESSINGS) ×4 IMPLANT
GLOVE BIO SURGEON STRL SZ 6 (GLOVE) ×2 IMPLANT
GLOVE BIO SURGEON STRL SZ 6.5 (GLOVE) ×6 IMPLANT
GLOVE BIO SURGEONS STRL SZ 6.5 (GLOVE) ×2
GLOVE BIOGEL PI IND STRL 6.5 (GLOVE) IMPLANT
GLOVE BIOGEL PI INDICATOR 6.5 (GLOVE) ×6
GOWN STRL REUS W/ TWL LRG LVL3 (GOWN DISPOSABLE) ×8 IMPLANT
GOWN STRL REUS W/TWL LRG LVL3 (GOWN DISPOSABLE) ×16
HEMOSTAT SURGICEL 2X14 (HEMOSTASIS) ×2 IMPLANT
KIT BASIN OR (CUSTOM PROCEDURE TRAY) ×4 IMPLANT
KIT CLEAN ENDO COMPLIANCE (KITS) ×4 IMPLANT
KIT ROOM TURNOVER OR (KITS) ×4 IMPLANT
KIT SUCTION CATH 14FR (SUCTIONS) ×4 IMPLANT
MARKER SKIN DUAL TIP RULER LAB (MISCELLANEOUS) ×4 IMPLANT
NDL BIOPSY TRANSBRONCH 21G (NEEDLE) IMPLANT
NEEDLE BIOPSY TRANSBRONCH 21G (NEEDLE) IMPLANT
NS IRRIG 1000ML POUR BTL (IV SOLUTION) ×16 IMPLANT
OIL SILICONE PENTAX (PARTS (SERVICE/REPAIRS)) ×4 IMPLANT
PACK CHEST (CUSTOM PROCEDURE TRAY) ×4 IMPLANT
PAD ARMBOARD 7.5X6 YLW CONV (MISCELLANEOUS) ×12 IMPLANT
PASSER SUT SWANSON 36MM LOOP (INSTRUMENTS) ×2 IMPLANT
POUCH SPECIMEN RETRIEVAL 10MM (ENDOMECHANICALS) ×2 IMPLANT
RELOAD GOLD ECHELON 45 (STAPLE) ×16 IMPLANT
SCISSORS LAP 5X35 DISP (ENDOMECHANICALS) IMPLANT
SEALANT PROGEL (MISCELLANEOUS) ×2 IMPLANT
SEALANT SURG COSEAL 4ML (VASCULAR PRODUCTS) IMPLANT
SEALANT SURG COSEAL 8ML (VASCULAR PRODUCTS) IMPLANT
SOLUTION ANTI FOG 6CC (MISCELLANEOUS) ×4 IMPLANT
SPONGE GAUZE 4X4 12PLY STER LF (GAUZE/BANDAGES/DRESSINGS) ×2 IMPLANT
STAPLER ECHELON POWERED (MISCELLANEOUS) ×2 IMPLANT
SUT PROLENE 3 0 SH DA (SUTURE) IMPLANT
SUT PROLENE 4 0 RB 1 (SUTURE)
SUT PROLENE 4-0 RB1 .5 CRCL 36 (SUTURE) IMPLANT
SUT SILK  1 MH (SUTURE) ×4
SUT SILK 1 MH (SUTURE) ×8 IMPLANT
SUT SILK 1 TIES 10X30 (SUTURE) IMPLANT
SUT SILK 2 0 SH (SUTURE) IMPLANT
SUT SILK 2 0SH CR/8 30 (SUTURE) ×2 IMPLANT
SUT SILK 3 0SH CR/8 30 (SUTURE) IMPLANT
SUT STEEL 1 (SUTURE) IMPLANT
SUT VIC AB 1 CTX 18 (SUTURE) ×2 IMPLANT
SUT VIC AB 1 CTX 36 (SUTURE)
SUT VIC AB 1 CTX36XBRD ANBCTR (SUTURE) IMPLANT
SUT VIC AB 2-0 CTX 36 (SUTURE) ×2 IMPLANT
SUT VIC AB 2-0 UR6 27 (SUTURE) IMPLANT
SUT VIC AB 3-0 SH 8-18 (SUTURE) IMPLANT
SUT VIC AB 3-0 X1 27 (SUTURE) ×2 IMPLANT
SUT VICRYL 0 UR6 27IN ABS (SUTURE) IMPLANT
SUT VICRYL 2 TP 1 (SUTURE) IMPLANT
SWAB COLLECTION DEVICE MRSA (MISCELLANEOUS) IMPLANT
SYR 20ML ECCENTRIC (SYRINGE) ×4 IMPLANT
SYSTEM SAHARA CHEST DRAIN ATS (WOUND CARE) ×4 IMPLANT
TAPE CLOTH SURG 4X10 WHT LF (GAUZE/BANDAGES/DRESSINGS) ×2 IMPLANT
TAPE UMBILICAL COTTON 1/8X30 (MISCELLANEOUS) ×4 IMPLANT
TIP APPLICATOR SPRAY EXTEND 16 (VASCULAR PRODUCTS) ×2 IMPLANT
TOWEL OR 17X24 6PK STRL BLUE (TOWEL DISPOSABLE) ×8 IMPLANT
TOWEL OR 17X26 10 PK STRL BLUE (TOWEL DISPOSABLE) ×8 IMPLANT
TRAP SPECIMEN MUCOUS 40CC (MISCELLANEOUS) ×4 IMPLANT
TRAY FOLEY CATH 16FRSI W/METER (SET/KITS/TRAYS/PACK) ×4 IMPLANT
TROCAR XCEL BLUNT TIP 100MML (ENDOMECHANICALS) IMPLANT
TUBE ANAEROBIC SPECIMEN COL (MISCELLANEOUS) IMPLANT
TUBE CONNECTING 20'X1/4 (TUBING) ×2
TUBE CONNECTING 20X1/4 (TUBING) ×6 IMPLANT
TUNNELER SHEATH ON-Q 11GX8 DSP (PAIN MANAGEMENT) IMPLANT
WATER STERILE IRR 1000ML POUR (IV SOLUTION) ×8 IMPLANT

## 2015-01-26 NOTE — H&P (Signed)
[address]       [place] [address]             [phone]                    Bryse R Karen [place] Medical Record #[id number] Date of Birth: 1925-05-10  Referring: Dr Julien Nordmann Primary Care: Myriam Jacobson, MD  Chief Complaint:    Lung Mass   History of Present Illness:    Garrett Henson 81 y.o. male is seen in the office   for a right hypermetabolic lung lesion. In mid June the patient was hauling lumber out of his yard after taking apart and old swimming pool. He developed discomfort in his left groin and was seen in the emergency room, patient notes he was told he had a hernia subsequently this is disappeared. While in emergency room a CT scan of the abdomen was performed this demonstrated a lung nodule and a formal CT scan of the chest was performed. July 29 the patient had a PET scan.  On reviewe of films from 2012 a right lung mass was present but has increased in size He discussed the findings with Dr. Julien Nordmann and was referred for consideration of surgical resection of what appears to be clinically a stage I carcinoma the lung. The patient does have a previous history of a stroke right frontal and parietal affecting his left side but he recovered very quickly. He had a myocardial infarction 4-5 years ago, treated medically. He has a history of diabetes on oral agents. He has a history of prostate cancer status post prostatectomy 20 years ago.  In spite  the patient's age he remains active working around his yard. He has a previous smoker but quit in 1961.   He denies any occupational exposure to hazardous chemicals or asbestos. 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   Zubrod Score: At the time of surgery this patient's most appropriate activity status/level should  be described as: '[]'$     0    Normal activity, no symptoms '[x]'$     1    Restricted in physical strenuous activity but ambulatory, able to do out light work '[]'$     2    Ambulatory and capable of self care, unable to do work activities, up and about               >50 % of waking hours                              '[]'$     3    Only limited self care, in bed greater than 50% of waking hours '[]'$     4    Completely disabled, no self care, confined to bed or chair '[]'$     5    Moribund   Past Medical History  Diagnosis Date  . Lung mass 12/01/2014  . Lung mass 12/01/2014  . Hypertension     takes Amlodipine daily  . Vertigo     takes Antivert daily as needed  . Diabetes mellitus     takes Metformin daily  . Hyperlipidemia     takes Crestor daily  . Overactive bladder     takes Myrbetriq daily  . Coronary artery  disease     takes Plavix daily  . GERD (gastroesophageal reflux disease)     takes tagamet daily  . History of colon polyps     benign  . History of migraine     many yrs ago  . TIA (transient ischemic attack)   . Arthritis   . Joint swelling   . Back pain   . Urinary frequency   . Urinary urgency   . Prostate cancer   . Nocturia   . History of blood transfusion     no abnormal reaction noted  . Macular degeneration     wet and on the left;injection 01/20/15    Past Surgical History  Procedure Laterality Date  . Prostatectomy    . Hip surgery Bilateral     pins   . Thyroidectomy, partial    . Cataract surgery Bilateral   . Colonosopy    . Esophagogastroduodenoscopy      Family History  Problem Relation Age of Onset  . Hyperlipidemia    . Hypertension    . Diabetes    . Deep vein thrombosis    . Stroke    . Coronary artery disease      Social History   Social History  . Marital Status: Married    Spouse Name: N/A  . Number of Children: N/A  . Years of Education: N/A   Occupational History  . Not on file.   Social History Main Topics  . Smoking status:  Former Smoker -- 0.50 packs/day for 30 years    Quit date: 06/05/1959  . Smokeless tobacco: Not on file  . Alcohol Use: No  . Drug Use: No  . Sexual Activity: Not on file   Other Topics Concern  . Not on file   Social History Narrative    History  Smoking status  . Former Smoker -- 0.50 packs/day for 30 years  . Quit date: 06/05/1959  Smokeless tobacco  . Not on file    History  Alcohol Use No     Allergies  Allergen Reactions  . Ivp Dye [Iodinated Diagnostic Agents] Shortness Of Breath  . Toviaz [Fesoterodine Fumarate Er] Swelling    Mouth swelling    Current Facility-Administered Medications  Medication Dose Route Frequency Provider Last Rate Last Dose  . cefUROXime (ZINACEF) 1.5 g in dextrose 5 % 50 mL IVPB  1.5 g Intravenous 60 min Pre-Op [name], MD       Facility-Administered Medications Ordered in Other Encounters  Medication Dose Route Frequency Provider Last Rate Last Dose  . fentaNYL (SUBLIMAZE) injection    Anesthesia Intra-op Moshe Salisbury, CRNA   25 mcg at 01/26/15 [phone]  . lactated ringers infusion    Continuous PRN Moshe Salisbury, CRNA      . lactated ringers infusion    Continuous PRN Moshe Salisbury, CRNA      . midazolam (VERSED) 5 MG/5ML injection    Anesthesia Intra-op Moshe Salisbury, CRNA   0.5 mg at 01/26/15 0347      Review of Systems:     Cardiac Review of Systems: Y or N  Chest Pain [ n   ]  Resting SOB [ n  ] Exertional SOB  Blue.Reese  ]  Orthopnea [  n]   Pedal Edema Florencio.Farrier   ]    Palpitations [ n ] Syncope  [ n ]   Presyncope [ y  ]  General Review of Systems: [Y] = yes [  ]=  no Constitional: recent weight change [n  ];  Wt loss over the last 3 months [   ] anorexia [  ]; fatigue Blue.Reese  ]; nausea [  ]; night sweats [n  ]; fever [n  ]; or chills [  ];          Dental: poor dentition[  ]; Last Dentist visit:   Eye : blurred vision [  ]; diplopia [   ]; vision changes [  ];  Amaurosis fugax[  ]; Resp: cough [  ];  wheezing[  ];  hemoptysis[  ];  shortness of breath[  ]; paroxysmal nocturnal dyspnea[  ]; dyspnea on exertion[  ]; or orthopnea[  ];  GI:  gallstones[  ], vomiting[  ];  dysphagia[  ]; melena[  ];  hematochezia [  ]; heartburn[  ];   Hx of  Colonoscopy[  ]; GU: kidney stones [ n ]; hematuria[ n ];   dysuria [n  ];  nocturia[n  ];  history of     obstruction [ n ]; urinary frequency [ y ]             Skin: rash, swelling[  ];, hair loss[  ];  peripheral edema[  ];  or itching[  ]; Musculosketetal: myalgias[  ];  joint swelling[  ];  joint erythema[  ];  joint pain[  ];  back pain[  ];  Heme/Lymph: bruising[ y ];  bleeding[  ];  anemia[  ];  Neuro: TIA[ n ];  headaches[ n ];  stroke[ y ];  vertigo[y  ];  seizures[  n];   paresthesias[ n ];  difficulty walking[ n ];  Psych:depression[  ]; anxiety[  ];  Endocrine: diabetes[y  ];  thyroid dysfunction[ n ];  Immunizations: Flu up to date Blue.Reese  ]; Pneumococcal up to date [ y];  Other:  Physical Exam: BP 175/59 mmHg  Pulse 67  Temp(Src) 97.7 F (36.5 C) (Oral)  Resp 18  Ht 5' 11.5" (1.816 m)  Wt 183 lb 11.2 oz (83.326 kg)  BMI 25.27 kg/m2  SpO2 100%  PHYSICAL EXAMINATION: General appearance: alert, cooperative, appears stated age and no distress Head: Normocephalic, without obvious abnormality, atraumatic Neck: no adenopathy, no carotid bruit, no JVD, supple, symmetrical, trachea midline and thyroid not enlarged, symmetric, no tenderness/mass/nodules Lymph nodes: Cervical, supraclavicular, and axillary nodes normal. Resp: clear to auscultation bilaterally Back: symmetric, no curvature. ROM normal. No CVA tenderness. Cardio: regular rate and rhythm, S1, S2 normal, no murmur, click, rub or gallop GI: soft, non-tender; bowel sounds normal; no masses,  no organomegaly Extremities: extremities normal, atraumatic, no cyanosis or edema and Homans sign is negative, no sign of DVT Neurologic: Grossly normal  Diagnostic Studies & Laboratory data:     Recent Radiology  Findings:     Nm Pet Image Initial (pi) Skull Base To Thigh  12/11/2014   CLINICAL DATA:  Initial treatment strategy for lung mass.  EXAM: NUCLEAR MEDICINE PET SKULL BASE TO THIGH  TECHNIQUE: 8.4 mCi F-18 FDG was injected intravenously. Full-ring PET imaging was performed from the skull base to thigh after the radiotracer. CT data was obtained and used for attenuation correction and anatomic localization.  FASTING BLOOD GLUCOSE:  Value: 122 mg/dl  COMPARISON:  10/30/2014  FINDINGS: NECK  No hypermetabolic lymph nodes in the neck. Mild chronic right maxillary sinusitis.  CHEST  The superior segment right lower lobe nodule measures 2.7 by 1.3 cm on image 31 series 6 and has maximum  standard uptake value of 5.2.  No hypermetabolic adenopathy. Coronary, aortic arch, and branch vessel atherosclerotic vascular disease. The tiny nodules referenced on prior CT are below PET-CT sensitive size thresholds.  ABDOMEN/PELVIS  Physiologic activity in the bowel. Cholelithiasis. Aortoiliac atherosclerotic vascular disease. Otherwise unremarkable.  SKELETON  No focal hypermetabolic activity to suggest skeletal metastasis. Postoperative or posttraumatic findings along the right iliac crest. Bilateral hip hardware in place.  IMPRESSION: 1. The right lower lobe superior segment nodule is hypermetabolic, compatible with malignancy. It abuts the pleural surface but pleural invasion is not definitively shown. Assuming non-small cell lung cancer, this appearance is compatible with T1b N0 M0 disease (stage IA). 2. The other tiny nodules shown on CT are not hypermetabolic but are far below sensitive PET-CT size thresholds. These likely merit periodic chest CT follow up. 3. Atherosclerosis. 4. Cholelithiasis. 5. Chronic right maxillary sinusitis.   Electronically Signed   By: Van Clines M.D.   On: 12/11/2014 10:15     CLINICAL DATA: Right lower lobe pulmonary nodule.  EXAM: CT CHEST WITHOUT  CONTRAST  TECHNIQUE: Multidetector CT imaging of the chest was performed following the standard protocol without IV contrast.  COMPARISON: Chest x-ray dated 10/26/2014 and CT scan of the abdomen dated 10/16/2014  FINDINGS: There is a 28 x 28 mm mass in the 18 x 18 mm spiculated mass in the posterior superior aspect of the right lower lobe consistent with a carcinoma of the lung.  There is a 3 mm nodule in the left upper lobe laterally on image 13 of series 6, a 2.2 mm nodule in the left lower lobe on image 33 of series 6 and 2 mm vague density in the left upper lobe on image 30 of series 6 and a 3 mm density in the left upper lobe on image 37 of series 6. There are 2 small intrapulmonary nodes in the right lung. These are not significant.  There is no appreciable hilar or mediastinal adenopathy. Heart size is normal. Extensive coronary artery calcification. Extensive calcification in the thoracic aorta.  The left lobe of the thyroid gland appears to have been removed.  Images of the upper abdomen demonstrate multiple small gallstones. Chronic accentuation of the thoracic kyphosis with an old compression fracture of T12.  IMPRESSION: 1. Spiculated 20 mm mass in the right lower lobe consistent with carcinoma of the lung. 2. Multiple tiny nodules in the left lung, indeterminate. Follow-up CT scan without contrast in 6 months recommended. 3. Cholelithiasis.   Electronically Signed  By: Lorriane Shire M.D.  On: 10/30/2014 15:43      *RADIOLOGY REPORT*  Clinical Data: Stroke  CHEST - 2 VIEW  Comparison: 04/21/2011  Findings: Hyperaeration. Ovoid opacity in the right lower lung zone has developed. Linear atelectasis at the left base. No pneumothorax. No pleural effusion. Stable T12 compression deformity. Osteopenia.  IMPRESSION: New right lower lung zone ovoid opacity. Pulmonary nodule is not excluded. This may represent focal airspace  disease. Attention on short-term follow-up.  Original Report Authenticated By: Jamas Lav, M.D.        I have independently reviewed the above radiology studies  and reviewed the findings with the patient.  PFT's FEV1 1.81  67%  DLCO 19.31  57%   6 Minute Walk Test Results  Patient:Garrett Henson Date:12/29/2014   Supplemental O2 during test?none   BaselineEnd  [id number] Heartrate6280 Dyspneanoslight Fatiguenosloght O2 sat97%92% Blood pressure175/72170/80   Patient ambulated at a steady pace for a total distance of 980  feet with no stops.  Ambulation was limited primarily due to nothing  Overall the test was tolerated well     Recent Lab Findings: Lab Results  Component Value Date   WBC 6.2 01/22/2015   HGB 12.2* 01/22/2015   HCT 35.9* 01/22/2015   PLT 228 01/22/2015   GLUCOSE 135* 01/22/2015   CHOL 145 04/23/2011   TRIG 77 04/23/2011   HDL 59 04/23/2011   LDLCALC 71 04/23/2011   ALT 19 01/22/2015   AST 27 01/22/2015   NA 137 01/22/2015   K 3.9 01/22/2015   CL 102 01/22/2015   CREATININE 0.92 01/22/2015   BUN 14 01/22/2015   CO2 24 01/22/2015   INR 1.05 01/22/2015   HGBA1C 6.6* 04/23/2011      Assessment / Plan:   81 years old white male with questionable stage I (T1a, N0, M0) non-small cell lung cancer presenting with right lower lobe pulmonary nodule. The patient also has multiple tiny nodules in the left lung that are nonspecific and could present  inflammatory lesions but metastatic disease could not be excluded. I have discussed the likely diagnosis of lung cancer with the patient and his wife treatment options including surgical resection possibly video-assisted thoracoscopy and wedge resection versus stereotactic radiotherapy were discussed with the patient. Pulmonary function completed, the patient by history is not significant limited shortness of breath.  He does have a history of diabetes, stroke, coronary occlusive disease. He has had   cardiology clearance from a cardiac standpoint.      Atherosclerosis of coronary arteries, noted on ct of chest  Cholelithiasis.   I have discussed further the options with the patient and he wishes to proceed with resection.   The goals risks and alternatives of the planned surgical procedure Bronchoscopy, right VATS and lung resection  have been discussed with the patient in detail. The risks of the procedure including death, infection, stroke, myocardial infarction, bleeding, blood transfusion have all been discussed specifically.  I have quoted Georg Ruddle Pica a 5 % of perioperative mortality and a complication rate as high as 40 %. The patient's questions have been answered.Kariem Wolfson Mentel is willing  to proceed with the planned procedure.  [name] MD      [address] [place] [address] Office [phone]   Beeper [phone]  01/26/2015 7:13 AM

## 2015-01-26 NOTE — Transfer of Care (Signed)
Immediate Anesthesia Transfer of Care Note  Patient: Garrett Henson  Procedure(s) Performed: Procedure(s): VIDEO BRONCHOSCOPY (N/A) VIDEO ASSISTED THORACOSCOPY (VATS) with Right Lower Lobe Wedge RESECTION and lymph node sampling. (Right)  Patient Location: PACU  Anesthesia Type:General  Level of Consciousness: sedated and responds to stimulation  Airway & Oxygen Therapy: Patient Spontanous Breathing and Patient connected to face mask oxygen  Post-op Assessment: Report given to RN, Post -op Vital signs reviewed and stable and Patient moving all extremities X 4  Post vital signs: Reviewed and stable  Last Vitals:  Filed Vitals:   01/26/15 0615  BP: 175/59  Pulse: 67  Temp: 36.5 C  Resp: 18    Complications: No apparent anesthesia complications

## 2015-01-26 NOTE — Brief Op Note (Addendum)
      [address]       [place], [address]             [phone]     01/26/2015  10:13 AM  PATIENT:  [name]  81 y.o. male  PRE-OPERATIVE DIAGNOSIS:  Right lung lesion  POST-OPERATIVE DIAGNOSIS:  Non-small cell carcinoma right lower lobe  PROCEDURE:   VIDEO BRONCHOSCOPY RIGHT VIDEO ASSISTED THORACOSCOPY   Right Lower Lobe Wedge resection  Lymph node dissection  SURGEON:  [name], MD  ASSISTANT: Suzzanne Cloud, PA-C  ANESTHESIA:   general  SPECIMEN:  Source of Specimen:  right lower lobe, mediastinal lymph nodes  DISPOSITION OF SPECIMEN:  Pathology  EBL: minimal (see anesthesia record)  DRAINS: 28 Fr CT  PATIENT CONDITION:  PACU - hemodynamically stable.

## 2015-01-26 NOTE — Anesthesia Procedure Notes (Addendum)
Procedure Name: Intubation Date/Time: 01/26/2015 7:34 AM Performed by: Melina Copa, Adwoa Axe R Pre-anesthesia Checklist: Patient identified, Emergency Drugs available, Suction available, Patient being monitored and Timeout performed Patient Re-evaluated:Patient Re-evaluated prior to inductionOxygen Delivery Method: Circle system utilized Preoxygenation: Pre-oxygenation with 100% oxygen Intubation Type: IV induction and Cricoid Pressure applied Ventilation: Mask ventilation without difficulty and Oral airway inserted - appropriate to patient size Laryngoscope Size: Mac and 4 Grade View: Grade I Tube type: Oral Endobronchial tube: Left and 41 Fr Number of attempts: 1 Airway Equipment and Method: Stylet and Oral airway Placement Confirmation: ETT inserted through vocal cords under direct vision,  positive ETCO2 and breath sounds checked- equal and bilateral Dental Injury: Teeth and Oropharynx as per pre-operative assessment     RIJ Dual Lumen CVP: [id number]: The patient was identified and consent obtained.  TO was performed, and full barrier precautions were used.  The skin was anesthetized with lidocaine.  Once the vein was located with the 22 ga. needle using ultrasound guidance , the wire was inserted into the vein.  The wire location was confirmed with ultrasound.  The tissue was dilated and the catheter was carefully inserted, then sutured in place. A dressing was applied. The patient tolerated the procedure well.   CE

## 2015-01-26 NOTE — Anesthesia Preprocedure Evaluation (Addendum)
Anesthesia Evaluation  Patient identified by MRN, date of birth, ID band Patient awake    Reviewed: Allergy & Precautions, NPO status , Patient's Chart, lab work & pertinent test results  History of Anesthesia Complications Negative for: history of anesthetic complications  Airway Mallampati: II  TM Distance: >3 FB Neck ROM: Full    Dental  (+) Edentulous Upper, Edentulous Lower, Dental Advisory Given   Pulmonary former smoker,  Lung cancer   Pulmonary exam normal breath sounds clear to auscultation       Cardiovascular hypertension, Pt. on medications (-) angina+ CAD and + Past MI  (-) Cardiac Stents Normal cardiovascular exam Rhythm:Regular Rate:Normal     Neuro/Psych TIA   GI/Hepatic Neg liver ROS, GERD  Medicated,  Endo/Other  diabetes, Well Controlled, Type 2, Oral Hypoglycemic Agents  Renal/GU negative Renal ROS     Musculoskeletal  (+) Arthritis , Osteoarthritis,    Abdominal   Peds  Hematology  (+) Blood dyscrasia, anemia ,   Anesthesia Other Findings Day of surgery medications reviewed with the patient.  Reproductive/Obstetrics                           Anesthesia Physical Anesthesia Plan  ASA: III  Anesthesia Plan: General   Post-op Pain Management:    Induction: Intravenous  Airway Management Planned: Double Lumen EBT  Additional Equipment: Arterial line, CVP and Ultrasound Guidance Line Placement  Intra-op Plan:   Post-operative Plan: Extubation in OR  Informed Consent: I have reviewed the patients History and Physical, chart, labs and discussed the procedure including the risks, benefits and alternatives for the proposed anesthesia with the patient or authorized representative who has indicated his/her understanding and acceptance.   Dental advisory given  Plan Discussed with: CRNA, Anesthesiologist and Surgeon  Anesthesia Plan Comments: (Risks/benefits of  general anesthesia discussed with patient including risk of damage to teeth, lips, gum, and tongue, nausea/vomiting, allergic reactions to medications, and the possibility of heart attack, stroke and death.  All patient questions answered.  Patient wishes to proceed.)       Anesthesia Quick Evaluation Hx of positive stress test leading to cardiac cath in 2010 with diffuse RCA disease of 30-40%, LCX 50%, 1st OM 60-70%, LAD 30-40% 1st diag 60%. EF 50-55%. Echo in 2012 after CVA with EF 55-60%.

## 2015-01-26 NOTE — Progress Notes (Signed)
Patient ID: Garrett Henson, male   DOB: [birth year], 81 y.o.   MRN: [id number] TCTS DAILY ICU PROGRESS NOTE                   [address]            [place] [address]          [phone]   Day of Surgery Procedure(s) (LRB): VIDEO BRONCHOSCOPY (N/A) VIDEO ASSISTED THORACOSCOPY (VATS) with Right Lower Lobe Wedge RESECTION and lymph node sampling. (Right)  Total Length of Stay:  LOS: 0 days   Subjective: Came to see patient in icu, note total of 800 ml in pluravac   since surgery this am, patient awake and neuro intact  Objective: Vital signs in last 24 hours: Temp:  [97.3 F (36.3 C)-98.5 F (36.9 C)] 98.5 F (36.9 C) (09/13 1730) Pulse Rate:  [38-85] 77 (09/13 1730) Cardiac Rhythm:  [-] Normal sinus rhythm (09/13 1700) Resp:  [12-21] 17 (09/13 1730) BP: (99-175)/(44-76) 99/50 mmHg (09/13 1700) SpO2:  [95 %-100 %] 98 % (09/13 1730) Arterial Line BP: (120-181)/(46-69) 149/50 mmHg (09/13 1730) FiO2 (%):  [0 %] 0 % (09/13 1600) Weight:  [183 lb 11.2 oz (83.326 kg)] 183 lb 11.2 oz (83.326 kg) (09/13 0615)  Filed Weights   01/26/15 0615  Weight: 183 lb 11.2 oz (83.326 kg)    Weight change:            Intake/Output this shift: Total I/O In: 1000 [I.V.:1000] Out: 1555 [Urine:705; Blood:50; Chest Tube:800]  Current Meds: Scheduled Meds: . acetaminophen  1,000 mg Oral 4 times per day   Or  . acetaminophen (TYLENOL) oral liquid 160 mg/5 mL  1,000 mg Oral 4 times per day  . amLODipine  5 mg Oral QHS  . aspirin EC  81 mg Oral Daily  . bisacodyl  10 mg Oral Daily  . cefUROXime (ZINACEF)  IV  1.5 g Intravenous Q12H  . fentaNYL   Intravenous 6 times per day  . insulin aspart  0-24 Units Subcutaneous 6 times per day  . mirabegron ER  50 mg Oral Daily  . multivitamin with minerals  1 tablet Oral QHS  . rosuvastatin  10 mg Oral QHS  . senna-docusate  1 tablet Oral QHS   Continuous Infusions: . dextrose 5 % and 0.45 % NaCl with KCl 20 mEq/L      PRN Meds:.diphenhydrAMINE **OR** diphenhydrAMINE, naloxone **AND** sodium chloride, ondansetron (ZOFRAN) IV, oxyCODONE, potassium chloride, traMADol  General appearance: alert, cooperative and no distress Neurologic: intact Heart: regular rate and rhythm, S1, S2 normal, no murmur, click, rub or gallop Lungs: dullness to percussion bibasilar Abdomen: soft, non-tender; bowel sounds normal; no masses,  no organomegaly Extremities: extremities normal, atraumatic, no cyanosis or edema and Homans sign is negative, no sign of DVT Wound: wound intact, drainage around chest tube and some clotts in tube   Lab Results: CBC:  Recent Labs  01/26/15 1650  WBC 13.8*  HGB 10.2*  HCT 30.9*  PLT 239   BMET:   Recent Labs  01/26/15 1700  NA 135  K 4.5  CL 107  CO2 23  GLUCOSE 235*  BUN 15  CREATININE 0.91  CALCIUM 8.2*    PT/INR: No results for input(s): LABPROT, INR in the last 72 hours. Radiology: Dg Chest Port 1 View  01/26/2015   CLINICAL DATA:  Chest tubes in place.  EXAM: PORTABLE CHEST - 1 VIEW  COMPARISON:  01/26/2015 at  11:09 a.m.  FINDINGS: Right IJ central venous catheter unchanged. Right-sided chest tube has been adjusted slightly and appears in adequate position. There is persistence of a small right apical pneumothorax without significant change. Persistent opacification over the right midlung slightly worse likely atelectasis post VATS. Mild left base opacification which is new likely atelectasis and possible small amount of left pleural fluid. Remainder of the exam is unchanged.  IMPRESSION: Slight worsening opacification over the right midlung likely atelectasis related to recent thoracic procedure. Mild worsening left base opacification likely small effusion and atelectasis.  Right-sided chest tube appears in adequate position. Stable small right apical pneumothorax.   Electronically Signed   By: Marin Olp M.D.   On: 01/26/2015 17:23   Dg Chest Port 1 View  01/26/2015    CLINICAL DATA:  Post opt right VATS, chest tube.  EXAM: PORTABLE CHEST - 1 VIEW  COMPARISON:  01/22/2015  FINDINGS: Right central line tip is at the cavoatrial junction. Right chest tube in place. Postoperative changes on the right. Small right pneumothorax. Bibasilar atelectasis noted. Heart is mildly enlarged.  IMPRESSION: Postoperative changes on the right with right chest tube in place. Small right apical pneumothorax.  Bibasilar atelectasis.   Electronically Signed   By: Rolm Baptise M.D.   On: 01/26/2015 11:24     Assessment/Plan: S/P Procedure(s) (LRB): VIDEO BRONCHOSCOPY (N/A) VIDEO ASSISTED THORACOSCOPY (VATS) with Right Lower Lobe Wedge RESECTION and lymph node sampling. (Right)  Vital signs stable  With increase drainage from chest tube repeat chest xray just done, no evidence hemothorax Patient had been on plavix stopped 5 days prior surgery, with ct drainage will transfuse platelets  On ss insulin  Monitor chest tube output    [name] 01/26/2015 5:59 PM

## 2015-01-26 NOTE — Anesthesia Postprocedure Evaluation (Signed)
  Anesthesia Post-op Note  Patient: Garrett Henson  Procedure(s) Performed: Procedure(s) (LRB): VIDEO BRONCHOSCOPY (N/A) VIDEO ASSISTED THORACOSCOPY (VATS) with Right Lower Lobe Wedge RESECTION and lymph node sampling. (Right)  Patient Location: PACU  Anesthesia Type: General  Level of Consciousness: awake and alert   Airway and Oxygen Therapy: Patient Spontanous Breathing  Post-op Pain: mild  Post-op Assessment: Post-op Vital signs reviewed, Patient's Cardiovascular Status Stable, Respiratory Function Stable, Patent Airway and No signs of Nausea or vomiting  Last Vitals:  Filed Vitals:   01/26/15 1555  BP:   Pulse:   Temp: 36.8 C  Resp:     Post-op Vital Signs: stable   Complications: No apparent anesthesia complications

## 2015-01-27 ENCOUNTER — Inpatient Hospital Stay (HOSPITAL_COMMUNITY): Payer: Medicare Other

## 2015-01-27 ENCOUNTER — Encounter (HOSPITAL_COMMUNITY): Payer: Self-pay | Admitting: Cardiothoracic Surgery

## 2015-01-27 LAB — GLUCOSE, CAPILLARY
Glucose-Capillary: 124 mg/dL — ABNORMAL HIGH (ref 65–99)
Glucose-Capillary: 151 mg/dL — ABNORMAL HIGH (ref 65–99)
Glucose-Capillary: 160 mg/dL — ABNORMAL HIGH (ref 65–99)
Glucose-Capillary: 187 mg/dL — ABNORMAL HIGH (ref 65–99)
Glucose-Capillary: 214 mg/dL — ABNORMAL HIGH (ref 65–99)
Glucose-Capillary: 84 mg/dL (ref 65–99)

## 2015-01-27 LAB — CBC
HEMATOCRIT: 25.2 % — AB (ref 39.0–52.0)
Hemoglobin: 8.5 g/dL — ABNORMAL LOW (ref 13.0–17.0)
MCH: 27.4 pg (ref 26.0–34.0)
MCHC: 33.7 g/dL (ref 30.0–36.0)
MCV: 81.3 fL (ref 78.0–100.0)
PLATELETS: 257 10*3/uL (ref 150–400)
RBC: 3.1 MIL/uL — ABNORMAL LOW (ref 4.22–5.81)
RDW: 14 % (ref 11.5–15.5)
WBC: 9.6 10*3/uL (ref 4.0–10.5)

## 2015-01-27 LAB — POCT I-STAT 3, ART BLOOD GAS (G3+)
Acid-base deficit: 1 mmol/L (ref 0.0–2.0)
Bicarbonate: 24.6 mEq/L — ABNORMAL HIGH (ref 20.0–24.0)
O2 Saturation: 99 %
TCO2: 26 mmol/L (ref 0–100)
pCO2 arterial: 43.9 mmHg (ref 35.0–45.0)
pH, Arterial: 7.356 (ref 7.350–7.450)
pO2, Arterial: 123 mmHg — ABNORMAL HIGH (ref 80.0–100.0)

## 2015-01-27 LAB — BASIC METABOLIC PANEL
ANION GAP: 7 (ref 5–15)
BUN: 16 mg/dL (ref 6–20)
CALCIUM: 7.8 mg/dL — AB (ref 8.9–10.3)
CO2: 24 mmol/L (ref 22–32)
CREATININE: 0.86 mg/dL (ref 0.61–1.24)
Chloride: 103 mmol/L (ref 101–111)
GFR calc Af Amer: >60 mL/min (ref >60)
GLUCOSE: 202 mg/dL — AB (ref 65–99)
Potassium: 4.2 mmol/L (ref 3.5–5.1)
Sodium: 134 mmol/L — ABNORMAL LOW (ref 135–145)

## 2015-01-27 LAB — TYPE AND SCREEN
ABO/RH(D): A POS
Antibody Screen: NEGATIVE

## 2015-01-27 LAB — PREPARE PLATELET PHERESIS: Unit division: 0

## 2015-01-27 NOTE — Plan of Care (Signed)
Problem: Phase I Progression Outcomes Goal: Initial discharge plan identified Outcome: Completed/Met Date Met:  01/27/15 Home with wife and support of children

## 2015-01-27 NOTE — Care Management Note (Deleted)
Case Management Note  Patient Details  Name: DOAK MAH MRN: [id number] Date of Birth: 08/01/1924  Subjective/Objective:    Patient from [place] - plan to go to [facility] on discharge.  SW  Consult placed.                 Action/Plan:   Expected Discharge Date:                  Expected Discharge Plan:  Skilled Nursing Facility  In-House Referral:  Clinical Social Work  Discharge planning Services     Post Acute Care Choice:    Choice offered to:     DME Arranged:    DME Agency:     HH Arranged:    [place] Agency:     Status of Service:  In process, will continue to follow  Medicare Important Message Given:    Date Medicare IM Given:    Medicare IM give by:    Date Additional Medicare IM Given:    Additional Medicare Important Message give by:     If discussed at [place] of Stay Meetings, dates discussed:    Additional Comments:  Vergie Living, RN 01/27/2015, 3:03 PM

## 2015-01-27 NOTE — Progress Notes (Signed)
CT surgery p.m. Rounds  Patient had a stable day Ambulating in the hallway currently Breathing comfortably Progressing well after right VATS

## 2015-01-27 NOTE — Progress Notes (Signed)
Patient ID: Garrett Henson, male   DOB: 1925/05/04, 81 y.o.   MRN: [id number] TCTS DAILY ICU PROGRESS NOTE                   [address]            [place] [address]          [phone]   1 Day Post-Op Procedure(s) (LRB): VIDEO BRONCHOSCOPY (N/A) VIDEO ASSISTED THORACOSCOPY (VATS) with Right Lower Lobe Wedge RESECTION and lymph node sampling. (Right)  Total Length of Stay:  LOS: 1 day   Subjective: Alert and up in chair, walked 100 feet   Objective: Vital signs in last 24 hours: Temp:  [97.3 F (36.3 C)-100 F (37.8 C)] 98.1 F (36.7 C) (09/14 0402) Pulse Rate:  [38-91] 63 (09/14 0700) Cardiac Rhythm:  [-] Normal sinus rhythm (09/14 0000) Resp:  [8-21] 16 (09/14 0759) BP: (96-159)/(35-76) 114/41 mmHg (09/14 0700) SpO2:  [95 %-100 %] 100 % (09/14 0759) Arterial Line BP: (111-181)/(30-69) 111/30 mmHg (09/14 0700) FiO2 (%):  [0 %] 0 % (09/13 1600) Weight:  [179 lb 10.8 oz (81.5 kg)] 179 lb 10.8 oz (81.5 kg) (09/14 0500)  Filed Weights   01/26/15 0615 01/27/15 0500  Weight: 183 lb 11.2 oz (83.326 kg) 179 lb 10.8 oz (81.5 kg)    Weight change: -4 lb 0.4 oz (-1.826 kg)   Hemodynamic parameters for last 24 hours:    Intake/Output from previous day: 09/13 0701 - 09/14 0700 In: 3390 [P.O.:540; I.V.:2425; Blood:375; IV Piggyback:50] Out: 5053 [Urine:1420; Blood:50; Chest Tube:1040]  Intake/Output this shift:    Current Meds: Scheduled Meds: . acetaminophen  1,000 mg Oral 4 times per day   Or  . acetaminophen (TYLENOL) oral liquid 160 mg/5 mL  1,000 mg Oral 4 times per day  . amLODipine  5 mg Oral QHS  . aspirin EC  81 mg Oral Daily  . bisacodyl  10 mg Oral Daily  . fentaNYL   Intravenous 6 times per day  . insulin aspart  0-24 Units Subcutaneous 6 times per day  . mirabegron ER  50 mg Oral Daily  . multivitamin with minerals  1 tablet Oral QHS  . rosuvastatin  10 mg Oral QHS  . senna-docusate  1 tablet Oral QHS   Continuous Infusions: .  dextrose 5 % and 0.45 % NaCl with KCl 20 mEq/L 75 mL/hr at 01/27/15 0700   PRN Meds:.diphenhydrAMINE **OR** diphenhydrAMINE, naloxone **AND** sodium chloride, ondansetron (ZOFRAN) IV, oxyCODONE, potassium chloride, traMADol  General appearance: alert, cooperative and no distress Neurologic: intact Heart: regular rate and rhythm, S1, S2 normal, no murmur, click, rub or gallop Lungs: diminished breath sounds bibasilar Abdomen: soft, non-tender; bowel sounds normal; no masses,  no organomegaly Extremities: extremities normal, atraumatic, no cyanosis or edema and Homans sign is negative, no sign of DVT Wound: no air leak, drainage has decreased  Lab Results: CBC:  Recent Labs  01/26/15 1650 01/27/15 0423  WBC 13.8* 9.6  HGB 10.2* 8.5*  HCT 30.9* 25.2*  PLT 239 257   BMET:   Recent Labs  01/26/15 1700 01/27/15 0423  NA 135 134*  K 4.5 4.2  CL 107 103  CO2 23 24  GLUCOSE 235* 202*  BUN 15 16  CREATININE 0.91 0.86  CALCIUM 8.2* 7.8*    PT/INR: No results for input(s): LABPROT, INR in the last 72 hours. Radiology: Dg Chest Port 1 View  01/26/2015   CLINICAL DATA:  Chest  tubes in place.  EXAM: PORTABLE CHEST - 1 VIEW  COMPARISON:  01/26/2015 at 11:09 a.m.  FINDINGS: Right IJ central venous catheter unchanged. Right-sided chest tube has been adjusted slightly and appears in adequate position. There is persistence of a small right apical pneumothorax without significant change. Persistent opacification over the right midlung slightly worse likely atelectasis post VATS. Mild left base opacification which is new likely atelectasis and possible small amount of left pleural fluid. Remainder of the exam is unchanged.  IMPRESSION: Slight worsening opacification over the right midlung likely atelectasis related to recent thoracic procedure. Mild worsening left base opacification likely small effusion and atelectasis.  Right-sided chest tube appears in adequate position. Stable small right  apical pneumothorax.   Electronically Signed   By: Marin Olp M.D.   On: 01/26/2015 17:23   Dg Chest Port 1 View  01/26/2015   CLINICAL DATA:  Post opt right VATS, chest tube.  EXAM: PORTABLE CHEST - 1 VIEW  COMPARISON:  01/22/2015  FINDINGS: Right central line tip is at the cavoatrial junction. Right chest tube in place. Postoperative changes on the right. Small right pneumothorax. Bibasilar atelectasis noted. Heart is mildly enlarged.  IMPRESSION: Postoperative changes on the right with right chest tube in place. Small right apical pneumothorax.  Bibasilar atelectasis.   Electronically Signed   By: Rolm Baptise M.D.   On: 01/26/2015 11:24   9/14 chest xray, chest tube moved in to fissure, apical ptx  Assessment/Plan: S/P Procedure(s) (LRB): VIDEO BRONCHOSCOPY (N/A) VIDEO ASSISTED THORACOSCOPY (VATS) with Right Lower Lobe Wedge RESECTION and lymph node sampling. (Right) Mobilize Diabetes control Continue foley due to urinary problems pre op, will leave foley until tomorrow when ambulateing better Expected Acute  Blood - loss Anemia Leave chest tube, avoid heparin today due to risk of bleeding, pas hose in place   [name] 01/27/2015 8:02 AM

## 2015-01-28 ENCOUNTER — Inpatient Hospital Stay (HOSPITAL_COMMUNITY): Payer: Medicare Other

## 2015-01-28 LAB — CBC
HEMATOCRIT: 23.6 % — AB (ref 39.0–52.0)
Hemoglobin: 7.7 g/dL — ABNORMAL LOW (ref 13.0–17.0)
MCH: 26.8 pg (ref 26.0–34.0)
MCHC: 32.6 g/dL (ref 30.0–36.0)
MCV: 82.2 fL (ref 78.0–100.0)
Platelets: 202 10*3/uL (ref 150–400)
RBC: 2.87 MIL/uL — ABNORMAL LOW (ref 4.22–5.81)
RDW: 14.1 % (ref 11.5–15.5)
WBC: 6.8 10*3/uL (ref 4.0–10.5)

## 2015-01-28 LAB — GLUCOSE, CAPILLARY
Glucose-Capillary: 98 mg/dL (ref 65–99)
Glucose-Capillary: 103 mg/dL — ABNORMAL HIGH (ref 65–99)
Glucose-Capillary: 187 mg/dL — ABNORMAL HIGH (ref 65–99)
Glucose-Capillary: 142 mg/dL — ABNORMAL HIGH (ref 65–99)
Glucose-Capillary: 110 mg/dL — ABNORMAL HIGH (ref 65–99)

## 2015-01-28 LAB — COMPREHENSIVE METABOLIC PANEL
ALT: 13 U/L — ABNORMAL LOW (ref 17–63)
ANION GAP: 4 — AB (ref 5–15)
AST: 25 U/L (ref 15–41)
Albumin: 2.7 g/dL — ABNORMAL LOW (ref 3.5–5.0)
Alkaline Phosphatase: 61 U/L (ref 38–126)
BILIRUBIN TOTAL: 0.4 mg/dL (ref 0.3–1.2)
BUN: 11 mg/dL (ref 6–20)
CALCIUM: 8.2 mg/dL — AB (ref 8.9–10.3)
CO2: 28 mmol/L (ref 22–32)
Chloride: 105 mmol/L (ref 101–111)
Creatinine, Ser: 0.82 mg/dL (ref 0.61–1.24)
Glucose, Bld: 107 mg/dL — ABNORMAL HIGH (ref 65–99)
POTASSIUM: 4 mmol/L (ref 3.5–5.1)
Sodium: 137 mmol/L (ref 135–145)
TOTAL PROTEIN: 5.5 g/dL — AB (ref 6.5–8.1)

## 2015-01-28 MED ORDER — METFORMIN HCL 500 MG PO TABS
1000.0000 mg | ORAL_TABLET | Freq: Two times a day (BID) | ORAL | Status: DC
  Administered 2015-01-28 – 2015-01-30 (×5): 1000 mg via ORAL
  Filled 2015-01-28 (×7): qty 2

## 2015-01-28 MED ORDER — ENOXAPARIN SODIUM 30 MG/0.3ML ~~LOC~~ SOLN
30.0000 mg | SUBCUTANEOUS | Status: DC
  Administered 2015-01-28 – 2015-01-29 (×2): 30 mg via SUBCUTANEOUS
  Filled 2015-01-28 (×3): qty 0.3

## 2015-01-28 MED ORDER — METFORMIN HCL 500 MG PO TABS: 1000.0000 mg | ORAL_TABLET | Freq: Two times a day (BID) | ORAL | Status: DC

## 2015-01-28 MED ORDER — OXYCODONE HCL 5 MG PO TABS: 5.0000 mg | ORAL_TABLET | ORAL | Status: DC | PRN

## 2015-01-28 MED ORDER — TRAMADOL HCL 50 MG PO TABS
50.0000 mg | ORAL_TABLET | Freq: Four times a day (QID) | ORAL | Status: DC | PRN
  Administered 2015-01-28: 50 mg via ORAL
  Filled 2015-01-28: qty 1

## 2015-01-28 NOTE — Progress Notes (Signed)
Patient ID: Garrett Henson, male   DOB: Jun 14, 1924, 81 y.o.   MRN: [id number] TCTS DAILY ICU PROGRESS NOTE                   [address]            [place] [address]          [phone]   2 Days Post-Op Procedure(s) (LRB): VIDEO BRONCHOSCOPY (N/A) VIDEO ASSISTED THORACOSCOPY (VATS) with Right Lower Lobe Wedge RESECTION and lymph node sampling. (Right)  Total Length of Stay:  LOS: 2 days   Subjective: Feels better, good respiratory effort  Objective: Vital signs in last 24 hours: Temp:  [97.6 F (36.4 C)-98.4 F (36.9 C)] 98.2 F (36.8 C) (09/15 0728) Pulse Rate:  [65-91] 77 (09/15 0800) Cardiac Rhythm:  [-] Normal sinus rhythm (09/15 0800) Resp:  [8-24] 16 (09/15 0800) BP: (78-158)/(31-93) 146/66 mmHg (09/15 0800) SpO2:  [97 %-100 %] 98 % (09/15 0800) Arterial Line BP: (118)/(40) 118/40 mmHg (09/14 0900) Weight:  [179 lb 0.2 oz (81.2 kg)] 179 lb 0.2 oz (81.2 kg) (09/15 0600)  Filed Weights   01/26/15 0615 01/27/15 0500 01/28/15 0600  Weight: 183 lb 11.2 oz (83.326 kg) 179 lb 10.8 oz (81.5 kg) 179 lb 0.2 oz (81.2 kg)    Weight change: -10.6 oz (-0.3 kg)   Hemodynamic parameters for last 24 hours:    Intake/Output from previous day: 09/14 0701 - 09/15 0700 In: 1127.5 [P.O.:760; I.V.:367.5] Out: 3690 [Urine:3470; Chest Tube:220]  Intake/Output this shift: Total I/O In: 10 [I.V.:10] Out: -   Current Meds: Scheduled Meds: . acetaminophen  1,000 mg Oral 4 times per day   Or  . acetaminophen (TYLENOL) oral liquid 160 mg/5 mL  1,000 mg Oral 4 times per day  . amLODipine  5 mg Oral QHS  . aspirin EC  81 mg Oral Daily  . bisacodyl  10 mg Oral Daily  . fentaNYL   Intravenous 6 times per day  . insulin aspart  0-24 Units Subcutaneous 6 times per day  . mirabegron ER  50 mg Oral Daily  . multivitamin with minerals  1 tablet Oral QHS  . rosuvastatin  10 mg Oral QHS  . senna-docusate  1 tablet Oral QHS   Continuous Infusions: . dextrose 5 %  and 0.45 % NaCl with KCl 20 mEq/L 10 mL/hr at 01/28/15 0019   PRN Meds:.diphenhydrAMINE **OR** diphenhydrAMINE, naloxone **AND** sodium chloride, ondansetron (ZOFRAN) IV, oxyCODONE, potassium chloride, traMADol  General appearance: alert, cooperative and no distress Neurologic: intact Heart: regular rate and rhythm, S1, S2 normal, no murmur, click, rub or gallop Lungs: clear to auscultation bilaterally Abdomen: soft, non-tender; bowel sounds normal; no masses,  no organomegaly Extremities: extremities normal, atraumatic, no cyanosis or edema Wound: no air leak from chest tubes  Lab Results: CBC: Recent Labs  01/27/15 0423 01/28/15 0330  WBC 9.6 6.8  HGB 8.5* 7.7*  HCT 25.2* 23.6*  PLT 257 202   BMET:  Recent Labs  01/27/15 0423 01/28/15 0330  NA 134* 137  K 4.2 4.0  CL 103 105  CO2 24 28  GLUCOSE 202* 107*  BUN 16 11  CREATININE 0.86 0.82  CALCIUM 7.8* 8.2*    PT/INR: No results for input(s): LABPROT, INR in the last 72 hours. Radiology: Dg Chest Port 1 View  01/28/2015   CLINICAL DATA:  Chest tube  EXAM: PORTABLE CHEST - 1 VIEW  COMPARISON:  Yesterday  FINDINGS: Stable orientation  of right-sided chest tube, presumably anterior given crossing of the midline. A kinking at the chest margin is less acute than previous. Decreased right pneumothorax, 10% or less today. Right chest wall gas is noted, slightly increased.  Right IJ central line, tip at the lower SVC level.  Stable cardiomegaly and aortic tortuosity. Unchanged bibasilar atelectasis, greatest around the right chest tube, with small left pleural effusion.  IMPRESSION: 1. Decreased small right pneumothorax, 10% or less. 2. Unchanged basilar atelectasis and small left effusion.   Electronically Signed   By: Monte Fantasia M.D.   On: 01/28/2015 08:03     Assessment/Plan: S/P Procedure(s) (LRB): VIDEO BRONCHOSCOPY (N/A) VIDEO ASSISTED THORACOSCOPY (VATS) with Right Lower Lobe Wedge RESECTION and lymph node sampling.  (Right) Mobilize Diuresis d/c tubes/lines Plan for transfer to step-down: see transfer orders Discussed final path with the patient and wife stage IA    [name] 01/28/2015 8:45 AM

## 2015-01-28 NOTE — Care Management Note (Signed)
Case Management Note  Patient Details  Name: Garrett Henson MRN: [id number] Date of Birth: 29-Mar-1925  Subjective/Objective:   Lives at home with wife, Vickii Chafe.  Independent prior, has had multiple surgeries prior and broken bones, has a walker a home.  Plan for discharge home with wife who will be with him 24/7.  Will continue to follow for progression                 Action/Plan:   Expected Discharge Date:                  Expected Discharge Plan:  [facility]  In-House Referral:     Discharge planning Services  CM Consult  Post Acute Care Choice:    Choice offered to:     DME Arranged:    DME Agency:     HH Arranged:    HH Agency:     Status of Service:  In process, will continue to follow  Medicare Important Message Given:    Date Medicare IM Given:    Medicare IM give by:    Date Additional Medicare IM Given:    Additional Medicare Important Message give by:     If discussed at [place] of Stay Meetings, dates discussed:    Additional Comments:  Vergie Living, RN 01/28/2015, 9:56 AM

## 2015-01-28 NOTE — Progress Notes (Signed)
Fentanyl 21cc wasted in sharps from PCA pump. Witnessed by San Jetty RN, and Jake Bathe RN.

## 2015-01-28 NOTE — Discharge Summary (Signed)
[address]       [place] [address]             [phone]              Discharge Summary  Name: Garrett Henson DOB: [birth year] 81 y.o. MRN: [id number]   Admission Date: 01/26/2015 Discharge Date: 01/30/2015    Admitting Diagnosis: Right lower lobe lung mass   Discharge Diagnosis:  Invasive well-differentiated adenocarcinoma, right lower lobe (T1a, N0) Expected postoperative blood loss anemia Chronic right lower extremity DVT  Past Medical History  Diagnosis Date  . Lung mass 12/01/2014  . Lung mass 12/01/2014  . Hypertension     takes Amlodipine daily  . Vertigo     takes Antivert daily as needed  . Diabetes mellitus     takes Metformin daily  . Hyperlipidemia     takes Crestor daily  . Overactive bladder     takes Myrbetriq daily  . Coronary artery disease     takes Plavix daily  . GERD (gastroesophageal reflux disease)     takes tagamet daily  . History of colon polyps     benign  . History of migraine     many yrs ago  . TIA (transient ischemic attack)   . Arthritis   . Joint swelling   . Back pain   . Urinary frequency   . Urinary urgency   . Prostate cancer   . Nocturia   . History of blood transfusion     no abnormal reaction noted  . Macular degeneration     wet and on the left;injection 01/20/15     Procedures: VIDEO BRONCHOSCOPY RIGHT VIDEO ASSISTED THORACOSCOPY - 01/26/2015  Right Lower Lobe Wedge resection  Lymph node dissection    HPI:  The patient is a 81 y.o. male who was recently seen in thoracic consultation by Dr. Servando Snare for evaluation of a hypermetabolic right lung lesion.  In mid June, the patient was hauling lumber out of his yard after taking apart an old swimming pool. He developed discomfort in his left groin and was seen in the emergency room. The  patient notes he was told he had a hernia, which subsequently disappeared. While in the emergency room, a CT scan of the abdomen was performed  which demonstrated a lung nodule and a formal CT scan of the chest was performed. This showed a 20 mm spiculated nodule in the right lower lobe consistent with carcinoma. A PET scan revealed increased uptake in the right lower lobe mass, with an SUV 5.2. On review of films from 2012, a right lung mass was present but has increased in size. The findings were discussed with Dr. Julien Nordmann and the was referred for consideration of surgical resection of what appears to be clinically a stage I carcinoma the lung. Dr. Servando Snare saw the patient and reviewed his films and felt that he would benefit from VATS/wedge resection vs stereotactic radiotherapy, and the patient felt he would like to proceed with surgery.  All risks, benefits and alternatives of surgery were explained in detail, and the patient agreed to proceed.     Hospital Course:  The patient was admitted to [facility] on 01/26/2015. The patient was taken to the operating room and underwent the above procedure.    The postoperative course was notable for high chest tube output early on.  Chest x-ray did not show evidence of hemothorax.  Since the patient had been on  Plavix until 5 days prior to surgery, he received a transfusion of platelets. Chest tube output stabililzed and decreased, and ultimately the chest tube was able to be removed on postop day 2. He also developed left calf pain and tenderness on postop day 3. A venous Duplex was performed and did not show any evidence of DVT on the left, but showed a chronic appearing DVT in the mid and distal right femoral vein.  These findings were discussed with Dr. Trula Slade from vascular surgery, and he agreed that the findings appeared chronic and would not require further anticoagulation.  The patient has otherwise remained stable. He is ambulating in the halls without difficulty and is tolerating a regular diet.  Incisions are healing well.  Chest x-rays have remained stable with no pneumothorax.  Final  pathology revealed invasive well-differentiated adenocarcinoma (T1a, N0).  The patient is presently medically stable and ready for discharge home.     Recent vital signs:  Filed Vitals:   01/30/15 0412  BP: 132/54  Pulse: 91  Temp: 98.7 F (37.1 C)  Resp: 18    Recent laboratory studies:  CBC:  Recent Labs  01/29/15 0516 01/30/15 0554  WBC 5.7 5.3  HGB 7.7* 7.6*  HCT 22.8* 23.2*  PLT 201 218   BMET:   Recent Labs  01/29/15 0516 01/30/15 0554  NA 138 135  K 3.9 4.0  CL 105 103  CO2 27 24  GLUCOSE 99 155*  BUN 10 11  CREATININE 0.83 0.81  CALCIUM 8.4* 8.3*    PT/INR: No results for input(s): LABPROT, INR in the last 72 hours.   Discharge Medications:     Medication List    TAKE these medications        amLODipine 5 MG tablet  Commonly known as:  NORVASC  Take 5 mg by mouth at bedtime.     aspirin EC 81 MG tablet  Take 81 mg by mouth daily.     BESIVANCE 0.6 % Susp  Generic drug:  Besifloxacin HCl  Place 1 drop into the left eye See admin instructions. Instil 1 drop into left eye 4 times on the day of eye injection and 4 times on the next day (injections are monthly - last injection 01/20/15)     cimetidine 400 MG tablet  Commonly known as:  TAGAMET  Take 400 mg by mouth 2 (two) times daily as needed (heartburn/ acid reflux).     clopidogrel 75 MG tablet  Commonly known as:  PLAVIX  Take 75 mg by mouth daily.     FIORICET 50-300-40 MG Caps  Generic drug:  Butalbital-APAP-Caffeine  Take 1 tablet by mouth 2 (two) times daily as needed (migraines).     fish oil-omega-3 fatty acids 1000 MG capsule  Take 1 g by mouth daily.     meclizine 25 MG tablet  Commonly known as:  ANTIVERT  Take 25 mg by mouth daily as needed for dizziness.     metFORMIN 1000 MG tablet  Commonly known as:  GLUCOPHAGE  Take 1,000 mg by mouth 2 (two) times daily with a meal.     multivitamin with minerals Tabs tablet  Take 1 tablet by mouth at bedtime.     MYRBETRIQ  50 MG Tb24 tablet  Generic drug:  mirabegron ER  Take 50 mg by mouth daily.     PHYTOSTEROL ESTERS PO  Take 900 mg by mouth daily.     rosuvastatin 10 MG tablet  Commonly known as:  CRESTOR  Take 10 mg by mouth at bedtime.     traMADol 50 MG tablet  Commonly known as:  ULTRAM  Take 1 tablet (50 mg total) by mouth every 12 (twelve) hours as needed (pain).     triamcinolone cream 0.1 %  Commonly known as:  KENALOG  Apply 1 application topically daily as needed (rash on groin and/or legs).         Discharge Instructions:  The patient is to refrain from driving, heavy lifting or strenuous activity.  May shower daily and clean incisions with soap and water.  May resume regular diet.   Follow Up:   Follow-up Information    Follow up with [name], MD On 02/25/2015.   Specialty:  Cardiothoracic Surgery   Why:  Have a chest x-ray at [facility] at 10:00, then see MD at 11:00   Contact information:   [address] [place] [address] [phone]       Follow up with [facility] RN On 02/04/2015.   Why:  For suture removal at 11:00        [place] 01/30/2015, 10:01 AM

## 2015-01-29 ENCOUNTER — Inpatient Hospital Stay (HOSPITAL_COMMUNITY): Payer: Medicare Other

## 2015-01-29 DIAGNOSIS — M79609 Pain in unspecified limb: Secondary | ICD-10-CM

## 2015-01-29 LAB — BASIC METABOLIC PANEL
Anion gap: 6 (ref 5–15)
BUN: 10 mg/dL (ref 6–20)
CO2: 27 mmol/L (ref 22–32)
Calcium: 8.4 mg/dL — ABNORMAL LOW (ref 8.9–10.3)
Chloride: 105 mmol/L (ref 101–111)
Creatinine, Ser: 0.83 mg/dL (ref 0.61–1.24)
GFR calc Af Amer: >60 mL/min (ref >60)
GFR calc non Af Amer: >60 mL/min (ref >60)
Glucose, Bld: 99 mg/dL (ref 65–99)
Potassium: 3.9 mmol/L (ref 3.5–5.1)
Sodium: 138 mmol/L (ref 135–145)

## 2015-01-29 LAB — GLUCOSE, CAPILLARY
Glucose-Capillary: 100 mg/dL — ABNORMAL HIGH (ref 65–99)
Glucose-Capillary: 122 mg/dL — ABNORMAL HIGH (ref 65–99)
Glucose-Capillary: 129 mg/dL — ABNORMAL HIGH (ref 65–99)
Glucose-Capillary: 159 mg/dL — ABNORMAL HIGH (ref 65–99)
Glucose-Capillary: 99 mg/dL (ref 65–99)

## 2015-01-29 LAB — CBC
HCT: 22.8 % — ABNORMAL LOW (ref 39.0–52.0)
Hemoglobin: 7.7 g/dL — ABNORMAL LOW (ref 13.0–17.0)
MCH: 27.9 pg (ref 26.0–34.0)
MCHC: 33.8 g/dL (ref 30.0–36.0)
MCV: 82.6 fL (ref 78.0–100.0)
Platelets: 201 10*3/uL (ref 150–400)
RBC: 2.76 MIL/uL — ABNORMAL LOW (ref 4.22–5.81)
RDW: 14 % (ref 11.5–15.5)
WBC: 5.7 10*3/uL (ref 4.0–10.5)

## 2015-01-29 MED ORDER — LACTULOSE 10 GM/15ML PO SOLN
20.0000 g | Freq: Once | ORAL | Status: AC
  Administered 2015-01-29: 20 g via ORAL
  Filled 2015-01-29: qty 30

## 2015-01-29 MED ORDER — POTASSIUM CHLORIDE CRYS ER 20 MEQ PO TBCR
20.0000 meq | EXTENDED_RELEASE_TABLET | Freq: Once | ORAL | Status: AC
  Administered 2015-01-29: 20 meq via ORAL
  Filled 2015-01-29: qty 1

## 2015-01-29 MED ORDER — FOLIC ACID 1 MG PO TABS
1.0000 mg | ORAL_TABLET | Freq: Every day | ORAL | Status: DC
  Administered 2015-01-29 – 2015-01-30 (×2): 1 mg via ORAL
  Filled 2015-01-29 (×2): qty 1

## 2015-01-29 NOTE — Progress Notes (Signed)
Patient complain of pain to left calf area. Patient states that he has had the pain in his off and on for 3-4 months.  Describes pain as aching with no radiating. States pain feel like "a charlie horse". Leg free of redness or swelling, warmth equal to right leg.

## 2015-01-29 NOTE — Progress Notes (Addendum)
      [address]       [place] [address]             [phone]       3 Days Post-Op Procedure(s) (LRB): VIDEO BRONCHOSCOPY (N/A) VIDEO ASSISTED THORACOSCOPY (VATS) with Right Lower Lobe Wedge RESECTION and lymph node sampling. (Right)  Subjective: Patient with complaints of left calf pain and constipation.  Objective: Vital signs in last 24 hours: Temp:  [98.2 F (36.8 C)-98.6 F (37 C)] 98.3 F (36.8 C) (09/16 0418) Pulse Rate:  [82-91] 85 (09/16 0418) Cardiac Rhythm:  [-] Normal sinus rhythm (09/15 2121) Resp:  [17-22] 20 (09/16 0418) BP: (114-164)/(54-90) 119/61 mmHg (09/16 0418) SpO2:  [94 %-100 %] 95 % (09/16 0418)     Intake/Output from previous day: 09/15 0701 - 09/16 0700 In: 460 [P.O.:440; I.V.:20] Out: 1575 [Urine:1560; Chest Tube:15]   Physical Exam:  Cardiovascular: RRR. Pulmonary: Slightly diminished at right base; no rales, wheezes, or rhonchi. Abdomen: Soft, non tender, bowel sounds present. Extremities: No lower extremity edema. Some calf tenderness L>R. No warmth or erythema Wounds: Clean and dry.  No erythema or signs of infection.   Lab Results: CBC: Recent Labs  01/28/15 0330 01/29/15 0516  WBC 6.8 5.7  HGB 7.7* 7.7*  HCT 23.6* 22.8*  PLT 202 201   BMET:  Recent Labs  01/28/15 0330 01/29/15 0516  NA 137 138  K 4.0 3.9  CL 105 105  CO2 28 27  GLUCOSE 107* 99  BUN 11 10  CREATININE 0.82 0.83  CALCIUM 8.2* 8.4*    PT/INR: No results for input(s): LABPROT, INR in the last 72 hours. ABG:  INR: Will add last result for INR, ABG once components are confirmed Will add last 4 CBG results once components are confirmed  Assessment/Plan:  1. CV - SR in the 80's. On Norvasc 5 mg daily. On Plavix pre op. Will discuss with Dr. Servando Snare if should restart 2.  Pulmonary - On room air. CXR this am shows small right apical pneumothorax, right base atelectasis, and small right pleural effusion.Encourage incentive  spirometer 3. Anemia-H and H stable at 7.7 and 22.8 this am. Start folic acid  4. Supplement potassium 5.DM-CBGs 129/99/100. On Metformin 6. Has had intemittent calf tenderness prior to surgery. Has some left calf tenderness this am. On Lovenox. Will check LE duplex for DVT 7. LOC constipation 8. Hope to discharge in am  ZIMMERMAN,DONIELLE MPA-C 01/29/2015,8:52 AM   Duplex scan- PRELIMINARY RESULTS BY TECH - Venous Lower Ext. Duplex Completed. Positive for chronic appearing deep vein thrombosis involving the mid and distal femoral vein in the right lower extremity. No evidence of deep or superficial vein thrombosis in the left lower extremity. Results given to [facility], patient's nurse. Rita Sturdivant, BS, RDMS, RVT  Patient was complaining of left calf tenderness -, duplex noted will have cardiology address anticoagulation plavix vs coumadin vs xarelto  -   I have seen and examined Fredrich Romans and agree with the above assessment  and plan.  [name] MD [facility] [phone] Office [phone] 01/29/2015 5:52 PM

## 2015-01-29 NOTE — Progress Notes (Signed)
PRELIMINARY RESULTS BY TECH - Venous Lower Ext. Duplex Completed. Positive for chronic appearing deep vein thrombosis involving the mid and distal femoral vein in the right lower extremity. No evidence of deep or superficial vein thrombosis in the left lower extremity. Results given to [facility], patient's nurse. Oda Cogan, BS, RDMS, RVT

## 2015-01-29 NOTE — Op Note (Signed)
NAMESAYAN, Garrett Henson NO.:  [id number]  MEDICAL RECORD NO.:  [id number]  LOCATION:  2W37C                        FACILITY:  [facility]  PHYSICIAN:  Lanelle Bal, MD    DATE OF BIRTH:  1924/06/26  DATE OF PROCEDURE:  01/26/2015 DATE OF DISCHARGE:                              OPERATIVE REPORT   PREOPERATIVE DIAGNOSIS:  Right lower lobe lung mass.  POSTOPERATIVE DIAGNOSIS:  Non-small cell carcinoma of the lung, right lower lobe.  PROCEDURES PERFORMED:  Bronchoscopy, right video-assisted thoracoscopy with wedge resection of right lower lobe nodule, and lymph node sampling.  SURGEON:  Lanelle Bal, MD  FIRST ASSISTANT:  Suzzanne Cloud, PA.  BRIEF HISTORY:  The patient is an 81 year old male, who presents with new finding of just under 2 cm mass peripherally in the right lower lobe.  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  Further evaluation with a PET scan was suggestive of a hypermetabolic lesion, but without any evidence of metastatic spread.  Consultation with the patient considering treatment options including surgery versus stereotactic radiotherapy was discussed.  The patient for his age remains relatively active, but does have a history of previous myocardial infarction and diabetes.  The patient elected to have the mass surgically removed and agreed and signed informed consent.  DESCRIPTION OF PROCEDURE:  With arterial line and central line in place, the patient underwent general endotracheal anesthesia with a double- lumen endotracheal tube.  Through the double-lumen endotracheal tube, a fiberoptic bronchoscope was passed to the subsegmental level both in the right and left confirming the placement of the tube.  The patient was then turned in lateral  decubitus position with the right side up.  A small incision was made in the right posterior chest and through this a video scope was introduced.  The right lower lobe was relatively free and with careful inspection, the area suggestive of pleural-based mass was identified.  A second small incision was made over this area and in between the scope site and incision, the lesion was stabilized and a wedge resection of the right lower lobe with multiple firings of 10 Ethicon stapler was performed.  The initial frozen section confirmed non- small cell carcinoma, probably adeno, it was in the closest margin on the frozen section, suggestion of atypical cells.  In this area, further area of wedge was performed and sent as a second margin that was free of tumor.  I then dissected along the inferior lobe and the fissure and identified 10R and 11R lymph nodes, which were sampled and submitted in separate specimen cups to Pathology for permanent section.  A single 28 chest tube was left through one port site and the second site was closed with interrupted 0-Vicryl and running 3-0 Vicryl in subcutaneous tissue. The right lung reinflated without air leak.  Because of the patient's age and underlying medical conditions and pulmonary functions, it was decided not to do a completion lobectomy.  Blood loss was minimal. Sponge and needle count was reported as correct  at the completion of the procedure. The patient tolerated the procedure without obvious complications and was extubated in the operating room and transferred to the recovery room for further postoperative care.     Lanelle Bal, MD     EG/MEDQ  D:  01/28/2015  T:  01/29/2015  Job:  [id number]

## 2015-01-29 NOTE — Care Management Important Message (Signed)
Important Message  Patient Details  Name: Garrett Henson MRN: [id number] Date of Birth: 1925-05-01   Medicare Important Message Given:  Yes-second notification given    Nathen May 01/29/2015, 11:28 AM

## 2015-01-29 NOTE — Progress Notes (Signed)
Utilization review completed.  

## 2015-01-30 ENCOUNTER — Inpatient Hospital Stay (HOSPITAL_COMMUNITY): Payer: Medicare Other

## 2015-01-30 LAB — BASIC METABOLIC PANEL
Anion gap: 8 (ref 5–15)
BUN: 11 mg/dL (ref 6–20)
CO2: 24 mmol/L (ref 22–32)
Calcium: 8.3 mg/dL — ABNORMAL LOW (ref 8.9–10.3)
Chloride: 103 mmol/L (ref 101–111)
Creatinine, Ser: 0.81 mg/dL (ref 0.61–1.24)
GFR calc Af Amer: >60 mL/min (ref >60)
GFR calc non Af Amer: >60 mL/min (ref >60)
Glucose, Bld: 155 mg/dL — ABNORMAL HIGH (ref 65–99)
Potassium: 4 mmol/L (ref 3.5–5.1)
Sodium: 135 mmol/L (ref 135–145)

## 2015-01-30 LAB — CBC
HCT: 23.2 % — ABNORMAL LOW (ref 39.0–52.0)
Hemoglobin: 7.6 g/dL — ABNORMAL LOW (ref 13.0–17.0)
MCH: 26.9 pg (ref 26.0–34.0)
MCHC: 32.8 g/dL (ref 30.0–36.0)
MCV: 82 fL (ref 78.0–100.0)
Platelets: 218 10*3/uL (ref 150–400)
RBC: 2.83 MIL/uL — ABNORMAL LOW (ref 4.22–5.81)
RDW: 13.8 % (ref 11.5–15.5)
WBC: 5.3 10*3/uL (ref 4.0–10.5)

## 2015-01-30 LAB — GLUCOSE, CAPILLARY
Glucose-Capillary: 121 mg/dL — ABNORMAL HIGH (ref 65–99)
Glucose-Capillary: 130 mg/dL — ABNORMAL HIGH (ref 65–99)
Glucose-Capillary: 129 mg/dL — ABNORMAL HIGH (ref 65–99)
Glucose-Capillary: 115 mg/dL — ABNORMAL HIGH (ref 65–99)

## 2015-01-30 MED ORDER — TRAMADOL HCL 50 MG PO TABS: 50.0000 mg | ORAL_TABLET | Freq: Two times a day (BID) | ORAL | Status: DC | PRN

## 2015-01-30 MED ORDER — CLOPIDOGREL BISULFATE 75 MG PO TABS
75.0000 mg | ORAL_TABLET | Freq: Every day | ORAL | Status: DC
  Administered 2015-01-30: 75 mg via ORAL
  Filled 2015-01-30: qty 1

## 2015-01-30 NOTE — Progress Notes (Signed)
Reviewed duplex of lower extremities with Dr Trula Slade. The findings appear chronic so will not fully anticoagulate as not consistent with ACUTE dvt.   [name] MD      [address] [place] [address] Office [phone]   [place]

## 2015-01-30 NOTE — Progress Notes (Signed)
       [address]       [place] [address]             [phone]          4 Days Post-Op Procedure(s) (LRB): VIDEO BRONCHOSCOPY (N/A) VIDEO ASSISTED THORACOSCOPY (VATS) with Right Lower Lobe Wedge RESECTION and lymph node sampling. (Right)  Subjective: Feels well, no complaints today. Bowels working. Left leg feeling better.   Objective: Vital signs in last 24 hours: Patient Vitals for the past 24 hrs:  BP Temp Temp src Pulse Resp SpO2  01/30/15 0412 (!) 132/54 mmHg 98.7 F (37.1 C) Oral 91 18 94 %  01/29/15 1943 (!) 132/58 mmHg 98.9 F (37.2 C) Oral 81 20 96 %  01/29/15 1455 (!) 134/57 mmHg 98.6 F (37 C) Oral 80 20 96 %   Current Weight  01/28/15 179 lb 0.2 oz (81.2 kg)     Intake/Output from previous day: 09/16 0701 - 09/17 0700 In: 600 [P.O.:600] Out: 1175 [Urine:1175]    PHYSICAL EXAM:  Heart: RRR Lungs: Clear, slightly decreased BS in L base Wound: Clean and dry Extremities: No LE edema or tenderness    Lab Results: CBC: Recent Labs  01/29/15 0516 01/30/15 0554  WBC 5.7 5.3  HGB 7.7* 7.6*  HCT 22.8* 23.2*  PLT 201 218   BMET:  Recent Labs  01/29/15 0516 01/30/15 0554  NA 138 135  K 3.9 4.0  CL 105 103  CO2 27 24  GLUCOSE 99 155*  BUN 10 11  CREATININE 0.83 0.81  CALCIUM 8.4* 8.3*    PT/INR: No results for input(s): LABPROT, INR in the last 72 hours.  CXR: FINDINGS: There is a small persistent apical pneumothorax estimated at 5%. Stable postoperative changes in the right lower lobe. No edema or effusions. Stable cardiac enlargement.  IMPRESSION: Stable postoperative changes involving the right lower lobe and stable right apical pneumothorax.   Assessment/Plan: S/P Procedure(s) (LRB): VIDEO BRONCHOSCOPY (N/A) VIDEO ASSISTED THORACOSCOPY (VATS) with Right Lower Lobe Wedge RESECTION and lymph node sampling. (Right) Stable from pulmonary standpoint. Chronic RLE DVT by Duplex. Per Dr. Trula Slade, will not  need further anticoagulation. Plan d/c home today- instructions reviewed with patient.   LOS: 4 days    COLLINS,GINA H 01/30/2015

## 2015-02-04 ENCOUNTER — Ambulatory Visit (INDEPENDENT_AMBULATORY_CARE_PROVIDER_SITE_OTHER): Payer: Self-pay | Admitting: *Deleted

## 2015-02-04 DIAGNOSIS — C3490 Malignant neoplasm of unspecified part of unspecified bronchus or lung: Secondary | ICD-10-CM

## 2015-02-04 DIAGNOSIS — C3431 Malignant neoplasm of lower lobe, right bronchus or lung: Secondary | ICD-10-CM

## 2015-02-04 DIAGNOSIS — Z4802 Encounter for removal of sutures: Secondary | ICD-10-CM

## 2015-02-04 DIAGNOSIS — Z09 Encounter for follow-up examination after completed treatment for conditions other than malignant neoplasm: Secondary | ICD-10-CM

## 2015-02-05 NOTE — Progress Notes (Signed)
Mr. Costlow returns for suture removal of previous chest tube sites s/p R VATS, RLLobe wedge resection. The sutures were easily removed and all sites are healing well.  He says he doesn't feel very well today. T99.1 He is using his spirometry at home and walking.  Breath sounds are good and clear. Eating and bowels are fair.  He using a walker.  He will return as scheduled with a cxr.

## 2015-02-11 ENCOUNTER — Emergency Department (HOSPITAL_BASED_OUTPATIENT_CLINIC_OR_DEPARTMENT_OTHER): Payer: Medicare Other

## 2015-02-11 ENCOUNTER — Encounter (HOSPITAL_BASED_OUTPATIENT_CLINIC_OR_DEPARTMENT_OTHER): Payer: Self-pay | Admitting: Emergency Medicine

## 2015-02-11 ENCOUNTER — Emergency Department (HOSPITAL_BASED_OUTPATIENT_CLINIC_OR_DEPARTMENT_OTHER)
Admission: EM | Admit: 2015-02-11 | Discharge: 2015-02-11 | Disposition: A | Discharge status: Home / Self Care | Payer: Medicare Other | Attending: Emergency Medicine | Admitting: Emergency Medicine

## 2015-02-11 DIAGNOSIS — Z8719 Personal history of other diseases of the digestive system: Secondary | ICD-10-CM | POA: Insufficient documentation

## 2015-02-11 DIAGNOSIS — Z87891 Personal history of nicotine dependence: Secondary | ICD-10-CM | POA: Diagnosis not present

## 2015-02-11 DIAGNOSIS — E119 Type 2 diabetes mellitus without complications: Secondary | ICD-10-CM | POA: Insufficient documentation

## 2015-02-11 DIAGNOSIS — Z79899 Other long term (current) drug therapy: Secondary | ICD-10-CM | POA: Diagnosis not present

## 2015-02-11 DIAGNOSIS — Z8546 Personal history of malignant neoplasm of prostate: Secondary | ICD-10-CM | POA: Diagnosis not present

## 2015-02-11 DIAGNOSIS — Z7982 Long term (current) use of aspirin: Secondary | ICD-10-CM | POA: Diagnosis not present

## 2015-02-11 DIAGNOSIS — Z86010 Personal history of colonic polyps: Secondary | ICD-10-CM | POA: Diagnosis not present

## 2015-02-11 DIAGNOSIS — I2510 Atherosclerotic heart disease of native coronary artery without angina pectoris: Secondary | ICD-10-CM | POA: Diagnosis not present

## 2015-02-11 DIAGNOSIS — K4090 Unilateral inguinal hernia, without obstruction or gangrene, not specified as recurrent: Secondary | ICD-10-CM

## 2015-02-11 DIAGNOSIS — R1030 Lower abdominal pain, unspecified: Secondary | ICD-10-CM | POA: Diagnosis present

## 2015-02-11 DIAGNOSIS — I10 Essential (primary) hypertension: Secondary | ICD-10-CM | POA: Diagnosis not present

## 2015-02-11 DIAGNOSIS — M1990 Unspecified osteoarthritis, unspecified site: Secondary | ICD-10-CM | POA: Insufficient documentation

## 2015-02-11 DIAGNOSIS — Z7902 Long term (current) use of antithrombotics/antiplatelets: Secondary | ICD-10-CM | POA: Insufficient documentation

## 2015-02-11 DIAGNOSIS — E785 Hyperlipidemia, unspecified: Secondary | ICD-10-CM | POA: Insufficient documentation

## 2015-02-11 DIAGNOSIS — Z8673 Personal history of transient ischemic attack (TIA), and cerebral infarction without residual deficits: Secondary | ICD-10-CM | POA: Insufficient documentation

## 2015-02-11 LAB — BASIC METABOLIC PANEL
ANION GAP: 10 (ref 5–15)
BUN: 23 mg/dL — ABNORMAL HIGH (ref 6–20)
CALCIUM: 8.8 mg/dL — AB (ref 8.9–10.3)
CHLORIDE: 98 mmol/L — AB (ref 101–111)
CO2: 23 mmol/L (ref 22–32)
Creatinine, Ser: 1 mg/dL (ref 0.61–1.24)
GFR calc non Af Amer: >60 mL/min (ref >60)
Glucose, Bld: 199 mg/dL — ABNORMAL HIGH (ref 65–99)
Potassium: 4.4 mmol/L (ref 3.5–5.1)
SODIUM: 131 mmol/L — AB (ref 135–145)

## 2015-02-11 LAB — CBC WITH DIFFERENTIAL/PLATELET
BASOS PCT: 1 %
Basophils Absolute: 0.1 10*3/uL (ref 0.0–0.1)
EOS ABS: 0.5 10*3/uL (ref 0.0–0.7)
Eosinophils Relative: 7 %
HEMATOCRIT: 26.3 % — AB (ref 39.0–52.0)
HEMOGLOBIN: 8.4 g/dL — AB (ref 13.0–17.0)
LYMPHS ABS: 1.2 10*3/uL (ref 0.7–4.0)
Lymphocytes Relative: 15 %
MCH: 25.9 pg — ABNORMAL LOW (ref 26.0–34.0)
MCHC: 31.9 g/dL (ref 30.0–36.0)
MCV: 81.2 fL (ref 78.0–100.0)
MONOS PCT: 10 %
Monocytes Absolute: 0.8 10*3/uL (ref 0.1–1.0)
NEUTROS ABS: 5.2 10*3/uL (ref 1.7–7.7)
NEUTROS PCT: 67 %
Platelets: 595 10*3/uL — ABNORMAL HIGH (ref 150–400)
RBC: 3.24 MIL/uL — AB (ref 4.22–5.81)
RDW: 13.5 % (ref 11.5–15.5)
WBC: 7.8 10*3/uL (ref 4.0–10.5)

## 2015-02-11 MED ORDER — TRAMADOL HCL 50 MG PO TABS: 50.0000 mg | ORAL_TABLET | Freq: Two times a day (BID) | ORAL | Status: DC | PRN

## 2015-02-11 NOTE — ED Notes (Signed)
Denies problems voiding

## 2015-02-11 NOTE — ED Notes (Signed)
Pt states that left groin is painful due to previously known hernia, recently lung cancer diagnosis, pt reports increased coughing from recent lung ca treatment has caused hernia to get worse

## 2015-02-11 NOTE — Discharge Instructions (Signed)

## 2015-02-11 NOTE — ED Provider Notes (Signed)
CSN: [id number]     Arrival date & time 02/11/15  1615 History   First MD Initiated Contact with Patient 02/11/15 1631     Chief Complaint  Patient presents with  . Groin Pain     (Consider location/radiation/quality/duration/timing/severity/associated sxs/prior Treatment) HPI Comments: Patient presents with pain to his left inguinal area. He has an unknown history of a left fat-containing inguinal hernia. He recently was diagnosed with a lung mass that was resected about 2 weeks ago. He states he's been doing well following the surgery but he has had a fair amount of coughing related to that. He's had some increased pain over last 2-3 days to his left inguinal area. He feels that the cough he may have exacerbated his inguinal hernia. He denies any palpable bulge. He denies any nausea or vomiting. He denies any fevers. He has not seen a surgeon yet for the hernia. He denies any significant constipation.  Patient is a 81 y.o. male presenting with groin pain.  Groin Pain Pertinent negatives include no chest pain, no abdominal pain, no headaches and no shortness of breath.    Past Medical History  Diagnosis Date  . Lung mass 12/01/2014  . Lung mass 12/01/2014  . Hypertension     takes Amlodipine daily  . Vertigo     takes Antivert daily as needed  . Diabetes mellitus     takes Metformin daily  . Hyperlipidemia     takes Crestor daily  . Overactive bladder     takes Myrbetriq daily  . Coronary artery disease     takes Plavix daily  . GERD (gastroesophageal reflux disease)     takes tagamet daily  . History of colon polyps     benign  . History of migraine     many yrs ago  . TIA (transient ischemic attack)   . Arthritis   . Joint swelling   . Back pain   . Urinary frequency   . Urinary urgency   . Prostate cancer   . Nocturia   . History of blood transfusion     no abnormal reaction noted  . Macular degeneration     wet and on the left;injection 01/20/15   Past Surgical  History  Procedure Laterality Date  . Prostatectomy    . Hip surgery Bilateral     pins   . Thyroidectomy, partial    . Cataract surgery Bilateral   . Colonosopy    . Esophagogastroduodenoscopy    . Video bronchoscopy N/A 01/26/2015    Procedure: VIDEO BRONCHOSCOPY;  Surgeon: [name], MD;  Location: [facility] OR;  Service: Thoracic;  Laterality: N/A;  . Video assisted thoracoscopy (vats)/wedge resection Right 01/26/2015    Procedure: VIDEO ASSISTED THORACOSCOPY (VATS) with Right Lower Lobe Wedge RESECTION and lymph node sampling.;  Surgeon: [name], MD;  Location: [place];  Service: Thoracic;  Laterality: Right;   Family History  Problem Relation Age of Onset  . Hyperlipidemia    . Hypertension    . Diabetes    . Deep vein thrombosis    . Stroke    . Coronary artery disease     Social History  Substance Use Topics  . Smoking status: Former Smoker -- 0.50 packs/day for 30 years    Quit date: 06/05/1959  . Smokeless tobacco: None  . Alcohol Use: No    Review of Systems  Constitutional: Negative for fever, chills, diaphoresis and fatigue.  HENT: Negative for congestion, rhinorrhea and  sneezing.   Eyes: Negative.   Respiratory: Negative for cough, chest tightness and shortness of breath.   Cardiovascular: Negative for chest pain and leg swelling.  Gastrointestinal: Negative for nausea, vomiting, abdominal pain, diarrhea and blood in stool.  Genitourinary: Negative for frequency, hematuria, flank pain and difficulty urinating.       Groin pain  Musculoskeletal: Negative for back pain and arthralgias.  Skin: Negative for rash.  Neurological: Negative for dizziness, speech difficulty, weakness, numbness and headaches.      Allergies  Ivp dye and Toviaz  Home Medications   Prior to Admission medications   Medication Sig Start Date End Date Taking? Authorizing Provider  amLODipine (NORVASC) 5 MG tablet Take 5 mg by mouth at bedtime.  10/25/14   Historical  Provider, MD  aspirin EC 81 MG tablet Take 81 mg by mouth daily.    Historical Provider, MD  Besifloxacin HCl (BESIVANCE) 0.6 % SUSP Place 1 drop into the left eye See admin instructions. Instil 1 drop into left eye 4 times on the day of eye injection and 4 times on the next day (injections are monthly - last injection 01/20/15)    Historical Provider, MD  Butalbital-APAP-Caffeine (FIORICET) 50-300-40 MG CAPS Take 1 tablet by mouth 2 (two) times daily as needed (migraines).     Historical Provider, MD  cimetidine (TAGAMET) 400 MG tablet Take 400 mg by mouth 2 (two) times daily as needed (heartburn/ acid reflux).     Historical Provider, MD  clopidogrel (PLAVIX) 75 MG tablet Take 75 mg by mouth daily.     Historical Provider, MD  fish oil-omega-3 fatty acids 1000 MG capsule Take 1 g by mouth daily.     Historical Provider, MD  meclizine (ANTIVERT) 25 MG tablet Take 25 mg by mouth daily as needed for dizziness.     Historical Provider, MD  metFORMIN (GLUCOPHAGE) 1000 MG tablet Take 1,000 mg by mouth 2 (two) times daily with a meal.      Historical Provider, MD  mirabegron ER (MYRBETRIQ) 50 MG TB24 tablet Take 50 mg by mouth daily.    Historical Provider, MD  Multiple Vitamin (MULTIVITAMIN WITH MINERALS) TABS tablet Take 1 tablet by mouth at bedtime.    Historical Provider, MD  PHYTOSTEROL ESTERS PO Take 900 mg by mouth daily.     Historical Provider, MD  rosuvastatin (CRESTOR) 10 MG tablet Take 10 mg by mouth at bedtime.  11/03/14   Historical Provider, MD  traMADol (ULTRAM) 50 MG tablet Take 1 tablet (50 mg total) by mouth every 12 (twelve) hours as needed (pain). 02/11/15   Malvin Johns, MD  triamcinolone cream (KENALOG) 0.1 % Apply 1 application topically daily as needed (rash on groin and/or legs).  11/30/14   Historical Provider, MD   BP 126/57 mmHg  Pulse 92  Temp(Src) 98.8 F (37.1 C) (Oral)  Resp 18  Ht 5' 10.5" (1.791 m)  Wt 176 lb (79.833 kg)  BMI 24.89 kg/m2  SpO2 95% Physical Exam   Constitutional: He is oriented to person, place, and time. He appears well-developed and well-nourished.  HENT:  Head: Normocephalic and atraumatic.  Eyes: Pupils are equal, round, and reactive to light.  Neck: Normal range of motion. Neck supple.  Cardiovascular: Normal rate, regular rhythm and normal heart sounds.   Pulmonary/Chest: Effort normal and breath sounds normal. No respiratory distress. He has no wheezes. He has no rales. He exhibits no tenderness.  Abdominal: Soft. Bowel sounds are normal. There is no tenderness.  There is no rebound and no guarding.  Genitourinary:  Positive tenderness to the left inguinal canal. No palpable hernia  Musculoskeletal: Normal range of motion. He exhibits no edema.  Lymphadenopathy:    He has no cervical adenopathy.  Neurological: He is alert and oriented to person, place, and time.  Skin: Skin is warm and dry. No rash noted.  Psychiatric: He has a normal mood and affect.    ED Course  Procedures (including critical care time) Labs Review Labs Reviewed  BASIC METABOLIC PANEL - Abnormal; Notable for the following:    Sodium 131 (*)    Chloride 98 (*)    Glucose, Bld 199 (*)    BUN 23 (*)    Calcium 8.8 (*)    All other components within normal limits  CBC WITH DIFFERENTIAL/PLATELET - Abnormal; Notable for the following:    RBC 3.24 (*)    Hemoglobin 8.4 (*)    HCT 26.3 (*)    MCH 25.9 (*)    Platelets 595 (*)    All other components within normal limits    Imaging Review Ct Pelvis Wo Contrast  02/11/2015   CLINICAL DATA:  Left groin pain  EXAM: CT PELVIS WITHOUT CONTRAST  TECHNIQUE: Multidetector CT imaging of the pelvis was performed following the standard protocol without intravenous contrast.  COMPARISON:  None.  FINDINGS: The bladder is partially distended. No pelvic mass lesion or sidewall abnormality is noted. Mild diverticular change of the colon is seen. The appendix is within normal limits. Diffuse aortoiliac calcifications  are noted without aneurysmal dilatation.  Fat containing left inguinal hernia is noted although no bowel is noted within. The osseous structures show degenerative change of the lumbar spine. Postsurgical changes are noted in the proximal femurs bilaterally.  IMPRESSION: Small fat containing inguinal hernia on the left. No incarceration of bowel is noted. No acute abnormality is seen.   Electronically Signed   By: Inez Catalina M.D.   On: 02/11/2015 17:26   I have personally reviewed and evaluated these images and lab results as part of my medical decision-making.   EKG Interpretation None      MDM   Final diagnoses:  Unilateral inguinal hernia without obstruction or gangrene, recurrence not specified    Patient has a fat-containing inguinal hernia without evidence of bowel obstruction or incarceration. His pain is well controlled in the ED. He has been taking tramadol for his postsurgical pain which she will continue. He requests a refill on the tramadol which I did give him. I encouraged him to have close follow-up with surgery. Return precautions were given.    Malvin Johns, MD 02/11/15 [phone]

## 2015-02-11 NOTE — ED Notes (Signed)
MD at bedside. 

## 2015-02-12 NOTE — Progress Notes (Signed)
Title of Study: PATHOLOGY PROCUREMENT  Description: Customer service manager for the Discovery and Validation of Biomarkers for the Prediction, Diagnosis and Management of Disease  Principal Investigator: Enid Cutter, MD  Study Coordinator: Rodena Goldmann  IRB #: [phone]  Met with Mr. Antigua for 15 minutes to review IRB# [id number]. 1 family member present. The patient is eligible and qualifies for this study. Reviewed the consent/HIPAA form in detail and explained the purpose of the study, study procedures, potential risks, potential benefits, and alternatives to participation. All of the patient's questions were answered. The patient agreed to take part in the study and signed the consent/HIPAA document. Enrollment procedures completed. The patient was given a copy of the signed informed consent. Consent form loaded in Media Tab (01/26/15 Procedure Note - DonorConsent.pdf)  Leftover tissue obtained by PA for research purposes. Site: Lung  Gross Measurement: N/A  Weight: approximately 695 mg of lung mass and 717 mg of adjacent benign lung tissue  (13-Sept-2016)  Rodena Goldmann  Pathology - [place]  Cell [phone]

## 2015-02-22 ENCOUNTER — Encounter (INDEPENDENT_AMBULATORY_CARE_PROVIDER_SITE_OTHER): Payer: Medicare Other | Admitting: Ophthalmology

## 2015-02-22 ENCOUNTER — Ambulatory Visit: Payer: Self-pay | Admitting: General Surgery

## 2015-02-22 DIAGNOSIS — H35033 Hypertensive retinopathy, bilateral: Secondary | ICD-10-CM | POA: Diagnosis not present

## 2015-02-22 DIAGNOSIS — I10 Essential (primary) hypertension: Secondary | ICD-10-CM | POA: Diagnosis not present

## 2015-02-22 DIAGNOSIS — E113293 Type 2 diabetes mellitus with mild nonproliferative diabetic retinopathy without macular edema, bilateral: Secondary | ICD-10-CM

## 2015-02-22 DIAGNOSIS — H43813 Vitreous degeneration, bilateral: Secondary | ICD-10-CM

## 2015-02-22 DIAGNOSIS — H353111 Nonexudative age-related macular degeneration, right eye, early dry stage: Secondary | ICD-10-CM

## 2015-02-22 DIAGNOSIS — H353221 Exudative age-related macular degeneration, left eye, with active choroidal neovascularization: Secondary | ICD-10-CM | POA: Diagnosis not present

## 2015-02-22 NOTE — H&P (Signed)
History of Present Illness Ralene Ok MD; 02/22/2015 3:00 PM) Patient words: evaluate umbilical hernia.  The patient is a 81 year old male who presents with an inguinal hernia. The patient is a 81 year old male who is referred by Lorene Dy for evaluation of a left inguinal hernia. The patient states that several months ago he had a hernia in bulge that was sensed and diagnosed on CT scan, and the hernia was also seen to be fat-containing.. CT scan also revealed a right lower lobe mass. Subsequent to this Dr. Servando Snare proceeded with wedge resection of the lung mass. This was approximate 4 weeks ago. The patient's been doing well from that surgery. The patient states that several days ago he began with left inguinal pain that was similar to his previous hernia pain. He states that he is unsure when the pain is going showup have symptoms from it. He states he would like to proceed with surgery to avoid any future pain or complications from the hernia. Patient currently sees Dr. Stanford Breed as his cardiologist. He is currently on Plavix.   Other Problems Ivor Costa, [place]; 02/22/2015 2:19 PM) Arthritis Cerebrovascular Accident Diabetes Mellitus High blood pressure Lung Cancer  Past Surgical History Ivor Costa, [place]; 02/22/2015 2:19 PM) Cataract Surgery Bilateral. Colon Polyp Removal - Colonoscopy Hip Surgery Right. Lung Surgery Right. Prostate Surgery - Removal Thyroid Surgery  Allergies Ivor Costa, CMA; 02/22/2015 2:20 PM) Diagnostic *DIAGNOSTIC PRODUCTS* Toviaz *URINARY ANTISPASMODICS*  Medication History Ivor Costa, CMA; 02/22/2015 2:27 PM) Norvasc ('5MG'$  Tablet, Oral) Active. Aspirin ('81MG'$  Tablet, Oral) Active. Besivance (0.6% Suspension, Ophthalmic) Active. Fioricet/Codeine (50-300-40-'30MG'$  Capsule, Oral) Active. Tagamet ('400MG'$  Tablet, Oral) Active. Plavix ('75MG'$  Tablet, Oral) Active. Fish Oil ('1000MG'$  Capsule, Oral) Active. Antivert  ('25MG'$  Tablet, Oral) Active. MetFORMIN HCl ('1000MG'$  Tablet, Oral) Active. Myrbetriq ('50MG'$  Tablet ER 24HR, Oral) Active. Crestor ('10MG'$  Tablet, Oral) Active. Ultram ('50MG'$  Tablet, Oral) Active. Kenalog (0.1% Cream, External) Active. Medications Reconciled  Social History Ivor Costa, [place]; 02/22/2015 2:19 PM) Caffeine use Carbonated beverages, Coffee. No alcohol use No drug use Tobacco use Former smoker.  Family History Ivor Costa, [place]; 02/22/2015 2:19 PM) Arthritis Sister. Cancer Brother. Cerebrovascular Accident Sister. Colon Cancer Daughter. Diabetes Mellitus Sister. Heart Disease Brother, Sister.  Review of Systems Ivor Costa CMA; 02/22/2015 2:19 PM) General Present- Appetite Loss, Fatigue and Night Sweats. Not Present- Chills, Fever, Weight Gain and Weight Loss. Skin Not Present- Change in Wart/Mole, Dryness, Hives, Jaundice, New Lesions, Non-Healing Wounds, Rash and Ulcer. HEENT Present- Hearing Loss and Wears glasses/contact lenses. Not Present- Earache, Hoarseness, Nose Bleed, Oral Ulcers, Ringing in the Ears, Seasonal Allergies, Sinus Pain, Sore Throat, Visual Disturbances and Yellow Eyes. Respiratory Present- Difficulty Breathing. Not Present- Bloody sputum, Chronic Cough, Snoring and Wheezing. Cardiovascular Not Present- Chest Pain, Difficulty Breathing Lying Down, Leg Cramps, Palpitations, Rapid Heart Rate, Shortness of Breath and Swelling of Extremities. Gastrointestinal Present- Bloating and Excessive gas. Not Present- Abdominal Pain, Bloody Stool, Change in Bowel Habits, Chronic diarrhea, Constipation, Difficulty Swallowing, Gets full quickly at meals, Hemorrhoids, Indigestion, Nausea, Rectal Pain and Vomiting. Male Genitourinary Present- Urine Leakage. Not Present- Blood in Urine, Change in Urinary Stream, Frequency, Impotence, Nocturia, Painful Urination and Urgency. Musculoskeletal Not Present- Back Pain, Joint Pain, Joint Stiffness, Muscle Pain,  Muscle Weakness and Swelling of Extremities. Neurological Present- Trouble walking and Weakness. Not Present- Decreased Memory, Fainting, Headaches, Numbness, Seizures, Tingling and Tremor.   Vitals Ivor Costa CMA; 02/22/2015 2:19 PM) 02/22/2015 2:18 PM Weight: 164 lb Temp.: 97.83F(Temporal)  Pulse: 80 (Regular)  Resp.:  18 (Unlabored)  BP: 138/80 (Sitting, Left Arm, Standard)    Physical Exam Ralene Ok MD; 02/22/2015 2:58 PM) General Mental Status-Alert. General Appearance-Consistent with stated age. Hydration-Well hydrated. Voice-Normal.  Head and Neck Head-normocephalic, atraumatic with no lesions or palpable masses. Trachea-midline.  Eye Eyeball - Bilateral-Extraocular movements intact. Sclera/Conjunctiva - Bilateral-No scleral icterus.  Chest and Lung Exam Chest and lung exam reveals -quiet, even and easy respiratory effort with no use of accessory muscles. Inspection Chest Wall - Normal. Back - normal.  Cardiovascular Cardiovascular examination reveals -normal heart sounds, regular rate and rhythm with no murmurs.  Abdomen Inspection Skin - Scar - no surgical scars. Hernias - Inguinal hernia - Left - Reducible. Palpation/Percussion Normal exam - Soft, Non Tender, No Rebound tenderness, No Rigidity (guarding) and No hepatosplenomegaly. Auscultation Normal exam - Bowel sounds normal.  Neurologic Neurologic evaluation reveals -alert and oriented x 3 with no impairment of recent or remote memory. Mental Status-Normal.  Musculoskeletal Normal Exam - Left-Upper Extremity Strength Normal and Lower Extremity Strength Normal. Normal Exam - Right-Upper Extremity Strength Normal, Lower Extremity Weakness.    Assessment & Plan Ralene Ok MD; 02/22/2015 3:01 PM) LEFT INGUINAL HERNIA (K40.90) Impression: 81 year old male with a left reducible inguinal hernia, fat-containing, likely indirect  1. Will obtain cardiac  clearance from Dr. Stanford Breed. Patient recently underwent care Dr. Roxy Horseman and underwent a left lower pulmonary wedge resection. The patient is also on Plavix. This will need to be stopped prior to surgery. 2. The patient will like to proceed to the operating room for laparoscopic left inguinal hernia repair.  3. I discussed with the patient the signs and symptoms of incarceration and strangulation and the need to proceed to the ER should they occur.  4. I discussed with the patient the risks and benefits of the procedure to include but not limited to: Infection, bleeding, damage to surrounding structures, possible need for further surgery, possible nerve pain, and possible recurrence. The patient was understanding and wishes to proceed.

## 2015-02-24 ENCOUNTER — Other Ambulatory Visit: Payer: Self-pay | Admitting: Cardiothoracic Surgery

## 2015-02-24 DIAGNOSIS — C3490 Malignant neoplasm of unspecified part of unspecified bronchus or lung: Secondary | ICD-10-CM

## 2015-02-25 ENCOUNTER — Ambulatory Visit: Payer: Medicare Other | Admitting: Cardiothoracic Surgery

## 2015-02-25 ENCOUNTER — Ambulatory Visit (INDEPENDENT_AMBULATORY_CARE_PROVIDER_SITE_OTHER): Payer: Self-pay | Admitting: Cardiothoracic Surgery

## 2015-02-25 ENCOUNTER — Ambulatory Visit
Admission: RE | Admit: 2015-02-25 | Discharge: 2015-02-25 | Disposition: A | Discharge status: Home / Self Care | Payer: Medicare Other | Source: Ambulatory Visit | Attending: Cardiothoracic Surgery | Admitting: Cardiothoracic Surgery

## 2015-02-25 ENCOUNTER — Encounter: Payer: Self-pay | Admitting: Cardiothoracic Surgery

## 2015-02-25 VITALS — BP 141/70 | HR 88 | Resp 16 | Ht 70.5 in | Wt 176.0 lb

## 2015-02-25 DIAGNOSIS — C3490 Malignant neoplasm of unspecified part of unspecified bronchus or lung: Secondary | ICD-10-CM

## 2015-02-25 DIAGNOSIS — C3431 Malignant neoplasm of lower lobe, right bronchus or lung: Secondary | ICD-10-CM

## 2015-02-25 DIAGNOSIS — Z09 Encounter for follow-up examination after completed treatment for conditions other than malignant neoplasm: Secondary | ICD-10-CM

## 2015-02-25 NOTE — Progress Notes (Signed)
[address]       [place] [address]             [phone]      Garrett Henson [facility] Medical Record #[id number] Date of Birth: 1924/06/26  Referring: Curt Bears, MD Primary Care: Myriam Jacobson, MD  Chief Complaint:   POST OP FOLLOW UP 01/26/2015 OPERATIVE REPORT PREOPERATIVE DIAGNOSIS: Right lower lobe lung mass. POSTOPERATIVE DIAGNOSIS: Non-small cell carcinoma of the lung, right lower lobe. PROCEDURES PERFORMED: Bronchoscopy, right video-assisted thoracoscopy with wedge resection of right lower lobe nodule, and lymph node sampling. SURGEON: Lanelle Bal, MD  Lung cancer [facility])   Staging form: Lung, AJCC 7th Edition     Pathologic stage from 01/27/2015: Stage IA (T1a, N0, cM0) - Signed by [name], MD    History of Present Illness:     Patient doing well postop, increasing activity , no sob. To have left inguinal hernia repaired next week     Past Medical History  Diagnosis Date  . Lung mass 12/01/2014  . Lung mass 12/01/2014  . Hypertension     takes Amlodipine daily  . Vertigo     takes Antivert daily as needed  . Diabetes mellitus     takes Metformin daily  . Hyperlipidemia     takes Crestor daily  . Overactive bladder     takes Myrbetriq daily  . Coronary artery disease     takes Plavix daily  . GERD (gastroesophageal reflux disease)     takes tagamet daily  . History of colon polyps     benign  . History of migraine     many yrs ago  . TIA (transient ischemic attack)   . Arthritis   . Joint swelling   . Back pain   . Urinary frequency   . Urinary urgency   . Prostate cancer ([place])   . Nocturia   . History of blood transfusion     no abnormal reaction noted  . Macular degeneration     wet and on the left;injection 01/20/15     History  Smoking status  . Former Smoker -- 0.50 packs/day for 30 years  . Quit date: 06/05/1959  Smokeless tobacco  . Not on file    History  Alcohol  Use No     Allergies  Allergen Reactions  . Ivp Dye [Iodinated Diagnostic Agents] Shortness Of Breath  . Toviaz [Fesoterodine Fumarate Er] Swelling    Mouth swelling    Current Outpatient Prescriptions  Medication Sig Dispense Refill  . amLODipine (NORVASC) 5 MG tablet Take 5 mg by mouth at bedtime.     Marland Kitchen aspirin EC 81 MG tablet Take 81 mg by mouth daily.    Marland Kitchen Besifloxacin HCl (BESIVANCE) 0.6 % SUSP Place 1 drop into the left eye See admin instructions. Instil 1 drop into left eye 4 times on the day of eye injection and 4 times on the next day (injections are monthly - last injection 01/20/15)    . Butalbital-APAP-Caffeine (FIORICET) 50-300-40 MG CAPS Take 1 tablet by mouth 2 (two) times daily as needed (migraines).     . cimetidine (TAGAMET) 400 MG tablet Take 400 mg by mouth 2 (two) times daily as needed (heartburn/ acid reflux).     . clopidogrel (PLAVIX) 75 MG tablet Take 75 mg by mouth daily.     . fish oil-omega-3 fatty acids 1000 MG capsule Take 1 g by mouth daily.     Marland Kitchen  meclizine (ANTIVERT) 25 MG tablet Take 25 mg by mouth daily as needed for dizziness.     . metFORMIN (GLUCOPHAGE) 1000 MG tablet Take 1,000 mg by mouth 2 (two) times daily with a meal.      . mirabegron ER (MYRBETRIQ) 50 MG TB24 tablet Take 50 mg by mouth daily.    . Multiple Vitamin (MULTIVITAMIN WITH MINERALS) TABS tablet Take 1 tablet by mouth at bedtime.    Marland Kitchen PHYTOSTEROL ESTERS PO Take 900 mg by mouth daily.     . rosuvastatin (CRESTOR) 10 MG tablet Take 10 mg by mouth at bedtime.     . triamcinolone cream (KENALOG) 0.1 % Apply 1 application topically daily as needed (rash on groin and/or legs).     . traMADol (ULTRAM) 50 MG tablet Take 1 tablet (50 mg total) by mouth every 12 (twelve) hours as needed (pain). (Patient not taking: Reported on 02/25/2015) 30 tablet 0   No current facility-administered medications for this visit.       Physical Exam: BP 141/70 mmHg  Pulse 88  Resp 16  Ht 5' 10.5" (1.791  m)  Wt 176 lb (79.833 kg)  BMI 24.89 kg/m2  SpO2 97%  General appearance: alert and cooperative Neurologic: intact Heart: regular rate and rhythm, S1, S2 normal, no murmur, click, rub or gallop Lungs: clear to auscultation bilaterally Abdomen: soft, non-tender; bowel sounds normal; no masses,  no organomegaly Extremities: extremities normal, atraumatic, no cyanosis or edema Wound: wounds well healed Left inguinal hernis  Diagnostic Studies & Laboratory data:     Recent Radiology Findings:   Dg Chest 2 View  02/25/2015  CLINICAL DATA:  History of lung malignancy status post wedge resection on January 26, 2015 EXAM: CHEST  2 VIEW COMPARISON:  PA and lateral chest x-ray of January 30, 2015 FINDINGS: The large right lower lobe mass persists. There is no right-sided pneumothorax remaining. There is a small right pleural effusion which has increased in volume since the previous study. The left lung is clear. The heart and pulmonary vascularity are normal. The mediastinum is normal in width. IMPRESSION: Persistent right lower lobe mass consistent with known malignancy. Mild interval increase in the volume of the small right pleural effusion. The right apical pneumothorax has resolved. Electronically Signed   By: David  Martinique M.D.   On: 02/25/2015 13:53    mass was removed- postop changes evident, will get follow up chest xray 3 weeks  Recent Lab Findings: Lab Results  Component Value Date   WBC 7.8 02/11/2015   HGB 8.4* 02/11/2015   HCT 26.3* 02/11/2015   PLT 595* 02/11/2015   GLUCOSE 199* 02/11/2015   CHOL 145 04/23/2011   TRIG 77 04/23/2011   HDL 59 04/23/2011   LDLCALC 71 04/23/2011   ALT 13* 01/28/2015   AST 25 01/28/2015   NA 131* 02/11/2015   K 4.4 02/11/2015   CL 98* 02/11/2015   CREATININE 1.00 02/11/2015   BUN 23* 02/11/2015   CO2 23 02/11/2015   INR 1.05 01/22/2015   HGBA1C 6.6* 04/23/2011      Assessment / Plan:     Return 3 weeks with repeat chest  xray Doing well post op       [name] MD      [address] [place] [address] Office [phone]   Beeper [phone]  02/25/2015 3:02 PM

## 2015-02-26 ENCOUNTER — Encounter: Payer: Self-pay | Admitting: *Deleted

## 2015-03-01 NOTE — Progress Notes (Signed)
HPI: FU CAD, CVA. Hx of positive stress test leading to cardiac cath in 2010 with diffuse RCA disease of 30-40%, LCX 50%, 1st OM 60-70%, LAD 30-40% 1st diag 60%. EF 50-55%. Echo in 2012 after CVA with EF 55-60%. G1DD.Nuclear study repeated September 2016. Ejection fraction 66%. Findings suggested prior inferior and apical thinning with mild apical ischemia. Patient treated medically. Had recent VATS/wedge resection of RLL nodule. Since last seen, He denies dyspnea, chest pain, palpitations or syncope.   Current Outpatient Prescriptions  Medication Sig Dispense Refill  . amLODipine (NORVASC) 5 MG tablet Take 5 mg by mouth at bedtime.     Marland Kitchen aspirin EC 81 MG tablet Take 81 mg by mouth daily.    Marland Kitchen Besifloxacin HCl (BESIVANCE) 0.6 % SUSP Place 1 drop into the left eye See admin instructions. Instil 1 drop into left eye 4 times on the day of eye injection and 4 times on the next day (injections are monthly - last injection 01/20/15)    . Butalbital-APAP-Caffeine (FIORICET) 50-300-40 MG CAPS Take 1 tablet by mouth 2 (two) times daily as needed (migraines).     . cimetidine (TAGAMET) 400 MG tablet Take 400 mg by mouth 2 (two) times daily as needed (heartburn/ acid reflux).     . clopidogrel (PLAVIX) 75 MG tablet Take 75 mg by mouth daily.     . fish oil-omega-3 fatty acids 1000 MG capsule Take 1 g by mouth daily.     . meclizine (ANTIVERT) 25 MG tablet Take 25 mg by mouth daily as needed for dizziness.     . metFORMIN (GLUCOPHAGE) 1000 MG tablet Take 1,000 mg by mouth 2 (two) times daily with a meal.      . mirabegron ER (MYRBETRIQ) 50 MG TB24 tablet Take 50 mg by mouth daily.    . Multiple Vitamin (MULTIVITAMIN WITH MINERALS) TABS tablet Take 1 tablet by mouth at bedtime.    Marland Kitchen PHYTOSTEROL ESTERS PO Take 900 mg by mouth daily.     . rosuvastatin (CRESTOR) 10 MG tablet Take 10 mg by mouth at bedtime.     . traMADol (ULTRAM) 50 MG tablet Take 1 tablet (50 mg total) by mouth every 12 (twelve) hours  as needed (pain). 30 tablet 0  . triamcinolone cream (KENALOG) 0.1 % Apply 1 application topically daily as needed (rash on groin and/or legs).      No current facility-administered medications for this visit.     Past Medical History  Diagnosis Date  . Lung mass 12/01/2014  . Lung mass 12/01/2014  . Hypertension     takes Amlodipine daily  . Vertigo     takes Antivert daily as needed  . Diabetes mellitus     takes Metformin daily  . Hyperlipidemia     takes Crestor daily  . Overactive bladder     takes Myrbetriq daily  . Coronary artery disease     takes Plavix daily  . GERD (gastroesophageal reflux disease)     takes tagamet daily  . History of colon polyps     benign  . History of migraine     many yrs ago  . TIA (transient ischemic attack)   . Arthritis   . Joint swelling   . Back pain   . Urinary frequency   . Urinary urgency   . Prostate cancer ([place])   . Nocturia   . History of blood transfusion     no abnormal reaction noted  .  Macular degeneration     wet and on the left;injection 01/20/15    Past Surgical History  Procedure Laterality Date  . Prostatectomy    . Hip surgery Bilateral     pins   . Thyroidectomy, partial    . Cataract surgery Bilateral   . Colonosopy    . Esophagogastroduodenoscopy    . Video bronchoscopy N/A 01/26/2015    Procedure: VIDEO BRONCHOSCOPY;  Surgeon: [name], MD;  Location: [facility] OR;  Service: Thoracic;  Laterality: N/A;  . Video assisted thoracoscopy (vats)/wedge resection Right 01/26/2015    Procedure: VIDEO ASSISTED THORACOSCOPY (VATS) with Right Lower Lobe Wedge RESECTION and lymph node sampling.;  Surgeon: [name], MD;  Location: [facility];  Service: Thoracic;  Laterality: Right;    Social History   Social History  . Marital Status: Married    Spouse Name: N/A  . Number of Children: N/A  . Years of Education: N/A   Occupational History  . Not on file.   Social History Main Topics  . Smoking status:  Former Smoker -- 0.50 packs/day for 30 years    Quit date: 06/05/1959  . Smokeless tobacco: Not on file  . Alcohol Use: No  . Drug Use: No  . Sexual Activity: Not on file   Other Topics Concern  . Not on file   Social History Narrative    ROS: no fevers or chills, productive cough, hemoptysis, dysphasia, odynophagia, melena, hematochezia, dysuria, hematuria, rash, seizure activity, orthopnea, PND, pedal edema, claudication. Remaining systems are negative.  Physical Exam: Well-developed well-nourished in no acute distress.  Skin is warm and dry.  HEENT is normal.  Neck is supple.  Chest Diminished breath sounds Cardiovascular exam is regular rate and rhythm.  Abdominal exam nontender or distended. No masses palpated. Extremities show no edema. neuro grossly intact  ECG 01/12/2015-sinus rhythm with no ST changes.

## 2015-03-02 ENCOUNTER — Encounter: Payer: Self-pay | Admitting: *Deleted

## 2015-03-02 ENCOUNTER — Ambulatory Visit (INDEPENDENT_AMBULATORY_CARE_PROVIDER_SITE_OTHER): Payer: Medicare Other | Admitting: Cardiology

## 2015-03-02 ENCOUNTER — Encounter: Payer: Self-pay | Admitting: Cardiology

## 2015-03-02 VITALS — BP 150/60 | HR 80 | Ht 71.0 in | Wt 164.0 lb

## 2015-03-02 DIAGNOSIS — Z01810 Encounter for preprocedural cardiovascular examination: Secondary | ICD-10-CM | POA: Diagnosis not present

## 2015-03-02 DIAGNOSIS — I2510 Atherosclerotic heart disease of native coronary artery without angina pectoris: Secondary | ICD-10-CM

## 2015-03-02 DIAGNOSIS — I2583 Coronary atherosclerosis due to lipid rich plaque: Principal | ICD-10-CM

## 2015-03-02 DIAGNOSIS — I679 Cerebrovascular disease, unspecified: Secondary | ICD-10-CM | POA: Diagnosis not present

## 2015-03-02 DIAGNOSIS — E785 Hyperlipidemia, unspecified: Secondary | ICD-10-CM | POA: Diagnosis not present

## 2015-03-02 NOTE — Assessment & Plan Note (Signed)
Continue aspirin and statin.Recent nuclear study is not felt to be high risk. He is not having symptoms. Plan medical therapy.

## 2015-03-02 NOTE — Patient Instructions (Signed)
Medication Instructions:   NO CHANGE  Testing/Procedures:  Your physician has requested that you have a carotid duplex. This test is an ultrasound of the carotid arteries in your neck. It looks at blood flow through these arteries that supply the brain with blood. Allow one hour for this exam. There are no restrictions or special instructions.    Follow-Up:  Your physician wants you to follow-up in: [place] will receive a reminder letter in the mail two months in advance. If you don't receive a letter, please call our office to schedule the follow-up appointment.

## 2015-03-02 NOTE — Assessment & Plan Note (Signed)
Blood pressure mildly elevatedBut he has orthostatic symptoms as well. I will not advance his Norvasc.

## 2015-03-02 NOTE — Assessment & Plan Note (Signed)
Continue statin. 

## 2015-03-02 NOTE — Assessment & Plan Note (Signed)
Patient is scheduled for hernia repair. Recent nuclear study felt to be low risk. He may proceed without further cardiac testing and his Plavix may be held for this procedure.

## 2015-03-02 NOTE — Assessment & Plan Note (Signed)
Schedule follow-up carotid Dopplers. 

## 2015-03-08 ENCOUNTER — Other Ambulatory Visit: Payer: Self-pay | Admitting: Surgery

## 2015-03-10 ENCOUNTER — Encounter (HOSPITAL_COMMUNITY)
Admission: RE | Admit: 2015-03-10 | Discharge: 2015-03-10 | Disposition: A | Discharge status: Home / Self Care | Payer: Medicare Other | Source: Ambulatory Visit | Attending: General Surgery | Admitting: General Surgery

## 2015-03-10 ENCOUNTER — Encounter (HOSPITAL_COMMUNITY): Payer: Self-pay

## 2015-03-10 DIAGNOSIS — M1990 Unspecified osteoarthritis, unspecified site: Secondary | ICD-10-CM | POA: Diagnosis not present

## 2015-03-10 DIAGNOSIS — E119 Type 2 diabetes mellitus without complications: Secondary | ICD-10-CM | POA: Diagnosis not present

## 2015-03-10 DIAGNOSIS — I10 Essential (primary) hypertension: Secondary | ICD-10-CM | POA: Diagnosis not present

## 2015-03-10 DIAGNOSIS — K219 Gastro-esophageal reflux disease without esophagitis: Secondary | ICD-10-CM | POA: Diagnosis not present

## 2015-03-10 DIAGNOSIS — Z85118 Personal history of other malignant neoplasm of bronchus and lung: Secondary | ICD-10-CM | POA: Diagnosis not present

## 2015-03-10 DIAGNOSIS — E785 Hyperlipidemia, unspecified: Secondary | ICD-10-CM | POA: Diagnosis not present

## 2015-03-10 DIAGNOSIS — N3281 Overactive bladder: Secondary | ICD-10-CM | POA: Diagnosis not present

## 2015-03-10 DIAGNOSIS — Z8673 Personal history of transient ischemic attack (TIA), and cerebral infarction without residual deficits: Secondary | ICD-10-CM | POA: Diagnosis not present

## 2015-03-10 DIAGNOSIS — D649 Anemia, unspecified: Secondary | ICD-10-CM | POA: Diagnosis not present

## 2015-03-10 DIAGNOSIS — Z79899 Other long term (current) drug therapy: Secondary | ICD-10-CM | POA: Diagnosis not present

## 2015-03-10 DIAGNOSIS — Z87891 Personal history of nicotine dependence: Secondary | ICD-10-CM | POA: Diagnosis not present

## 2015-03-10 DIAGNOSIS — Z7982 Long term (current) use of aspirin: Secondary | ICD-10-CM | POA: Diagnosis not present

## 2015-03-10 DIAGNOSIS — Z7902 Long term (current) use of antithrombotics/antiplatelets: Secondary | ICD-10-CM | POA: Diagnosis not present

## 2015-03-10 DIAGNOSIS — Z8546 Personal history of malignant neoplasm of prostate: Secondary | ICD-10-CM | POA: Diagnosis not present

## 2015-03-10 DIAGNOSIS — K4090 Unilateral inguinal hernia, without obstruction or gangrene, not specified as recurrent: Secondary | ICD-10-CM | POA: Diagnosis present

## 2015-03-10 DIAGNOSIS — Z7984 Long term (current) use of oral hypoglycemic drugs: Secondary | ICD-10-CM | POA: Diagnosis not present

## 2015-03-10 DIAGNOSIS — I2510 Atherosclerotic heart disease of native coronary artery without angina pectoris: Secondary | ICD-10-CM | POA: Diagnosis not present

## 2015-03-10 HISTORY — DX: Cerebral infarction, unspecified: I63.9

## 2015-03-10 HISTORY — DX: Headache, unspecified: R51.9

## 2015-03-10 HISTORY — DX: Headache: R51

## 2015-03-10 LAB — BASIC METABOLIC PANEL
ANION GAP: 15 (ref 5–15)
BUN: 21 mg/dL — ABNORMAL HIGH (ref 6–20)
CALCIUM: 9.6 mg/dL (ref 8.9–10.3)
CO2: 22 mmol/L (ref 22–32)
Chloride: 100 mmol/L — ABNORMAL LOW (ref 101–111)
Creatinine, Ser: 0.82 mg/dL (ref 0.61–1.24)
GFR calc Af Amer: >60 mL/min (ref >60)
Glucose, Bld: 148 mg/dL — ABNORMAL HIGH (ref 65–99)
POTASSIUM: 4.2 mmol/L (ref 3.5–5.1)
SODIUM: 137 mmol/L (ref 135–145)

## 2015-03-10 LAB — CBC
HCT: 28.6 % — ABNORMAL LOW (ref 39.0–52.0)
Hemoglobin: 8.9 g/dL — ABNORMAL LOW (ref 13.0–17.0)
MCH: 24.5 pg — ABNORMAL LOW (ref 26.0–34.0)
MCHC: 31.1 g/dL (ref 30.0–36.0)
MCV: 78.8 fL (ref 78.0–100.0)
PLATELETS: 365 10*3/uL (ref 150–400)
RBC: 3.63 MIL/uL — AB (ref 4.22–5.81)
RDW: 14.8 % (ref 11.5–15.5)
WBC: 5.6 10*3/uL (ref 4.0–10.5)

## 2015-03-10 LAB — GLUCOSE, CAPILLARY: Glucose-Capillary: 163 mg/dL — ABNORMAL HIGH (ref 65–99)

## 2015-03-11 LAB — HEMOGLOBIN A1C
HEMOGLOBIN A1C: 7.5 % — AB (ref 4.8–5.6)
Mean Plasma Glucose: 169 mg/dL

## 2015-03-11 MED ORDER — CEFAZOLIN SODIUM-DEXTROSE 2-3 GM-% IV SOLR
2.0000 g | INTRAVENOUS | Status: AC
  Administered 2015-03-12: 2 g via INTRAVENOUS
  Filled 2015-03-11: qty 50

## 2015-03-11 MED ORDER — CHLORHEXIDINE GLUCONATE 4 % EX LIQD: 1.0000 "application " | Freq: Once | CUTANEOUS | Status: DC

## 2015-03-11 NOTE — Progress Notes (Signed)
Anesthesia Chart Review: 81 year old male scheduled for laparoscopic left inguinal hernia repair on 03/12/15 by Dr. Rosendo Gros.  History includes RLL invasive well-differentiated adenocarcinoma (T1a, N0) lung cancer s/p right VATS with RLL wedge resection 01/26/15 (Dr. Servando Snare), remote former smoker, HTN, vertigo, DM2, HLD, overactive bladder, moderate CAD (medical therapy recommended after 2010 cath), GERD, migraines, TIA '12, prostate cancer s/p prostatectomy, macular degeneration, partial thyroidectomy, finding of indeterminate age chronic RLE DVT 01/29/15 (felt chronic by vascular surgeon Dr. Trula Slade and no further anticoagulation recommended). PCP is Dr. Lorene Dy. HEM-ONC is Dr. Julien Nordmann.   Cardiologist is Dr. Stanford Breed, last visit 03/02/15. He felt patient could proceed without further cardiac testing and gave permission to hold Plavix preoperatively.   01/12/15 EKG: NSR  01/15/15 Nuclear stress test:  There was no ST segment deviation noted during stress.  This is a low risk study.  The left ventricular ejection fraction is hyperdynamic (>65%).  Nuclear stress EF: 66%. Low risk stress nuclear study with a small, mild, fixed inferior defect and a small, moderate intensity, partially reversible apical defect; findings consistent with inferior and apical thinning with very mild apical ischemia; EF 66 and normal wall motion.  04/23/11 Echo: Study Conclusions - Left ventricle: The cavity size was normal. Systolic function was normal. The estimated ejection fraction was in the range of 55% to 60%. Wall motion was normal; there were no regional wall motion abnormalities. Doppler parameters are consistent with abnormal left ventricular relaxation (grade 1 diastolic dysfunction). - Aortic valve: Mildly calcified annulus. Mildly thickened leaflets. - Mitral valve: Calcified annulus. Mildly calcified leaflets . Mild regurgitation. - Atrial septum: No defect or patent foramen  ovale was identified. Impressions: - No cardiac source of embolism was identified, but cannot be ruled out on the basis of this examination.  03/03/09 Cardiac cath (done for chest pain with Cardiolite revealing apical ischemia): Diffuse RCA disease of 30-40%, LCX 50%, 1st OM 60-70% (small vessel and not a good candidate for PCI), LAD 30-40% 1st diag 60% (small vessel and not a good candidate for PCI). EF 50-55%. Conclusions: Moderate CAD. No stenoses that are suitable for angioplasty. Medical therapy recommended.  04/23/11 Carotid duplex: Summary: - Right - There is a 40% to 59% low range ICA stenosis. Moderate ECA stenosis at the origin. - Left - There is a 40% to 59% upper range ICA stenosis. Moderate ECA stenosis. - Vertebrals are patent and flow is antegrade.  02/25/15 CXR: IMPRESSION: Persistent right lower lobe mass consistent with known malignancy. Mild interval increase in the volume of the small right pleural effusion. The right apical pneumothorax has resolved.  Preoperative labs noted. H/H 8.9/28.6, up when compared to post-operative CBC results following his recent lung resection 01/2015. Discharge summary mentions PLT given post-op due to high chest tube out-put, but otherwsie I don't see that he was given any PRBCs. (Baseline H/H prior to lung surgery was 12.2/35.9). Although H/H improving, his counts haven't quite recovered since his lung resection. I have called H/H results to [facility] triage nurse Sunday Spillers to review with Dr. Rosendo Gros. I will enter an order for T&S, but defer decision for transfusion to Dr. Rosendo Gros and/or anesthesiologist. Patient does have known CAD, so may be more apt to transfuse.  Garrett Henson [facility] Short Stay Center/Anesthesiology Phone [phone] 03/11/2015 10:09 AM

## 2015-03-12 ENCOUNTER — Ambulatory Visit (HOSPITAL_COMMUNITY)
Admission: RE | Admit: 2015-03-12 | Discharge: 2015-03-12 | Disposition: A | Discharge status: Home / Self Care | Payer: Medicare Other | Source: Ambulatory Visit | Attending: General Surgery | Admitting: General Surgery

## 2015-03-12 ENCOUNTER — Ambulatory Visit (HOSPITAL_COMMUNITY): Payer: Medicare Other | Admitting: Vascular Surgery

## 2015-03-12 ENCOUNTER — Inpatient Hospital Stay (HOSPITAL_COMMUNITY): Admission: RE | Admit: 2015-03-12 | Payer: Medicare Other | Source: Ambulatory Visit

## 2015-03-12 ENCOUNTER — Encounter (HOSPITAL_COMMUNITY)
Admission: RE | Disposition: A | Discharge status: Home / Self Care | Payer: Self-pay | Source: Ambulatory Visit | Attending: General Surgery

## 2015-03-12 ENCOUNTER — Ambulatory Visit (HOSPITAL_COMMUNITY): Payer: Medicare Other | Admitting: Anesthesiology

## 2015-03-12 DIAGNOSIS — Z8673 Personal history of transient ischemic attack (TIA), and cerebral infarction without residual deficits: Secondary | ICD-10-CM | POA: Insufficient documentation

## 2015-03-12 DIAGNOSIS — I2510 Atherosclerotic heart disease of native coronary artery without angina pectoris: Secondary | ICD-10-CM | POA: Insufficient documentation

## 2015-03-12 DIAGNOSIS — K219 Gastro-esophageal reflux disease without esophagitis: Secondary | ICD-10-CM | POA: Insufficient documentation

## 2015-03-12 DIAGNOSIS — K4090 Unilateral inguinal hernia, without obstruction or gangrene, not specified as recurrent: Secondary | ICD-10-CM | POA: Insufficient documentation

## 2015-03-12 DIAGNOSIS — Z8546 Personal history of malignant neoplasm of prostate: Secondary | ICD-10-CM | POA: Insufficient documentation

## 2015-03-12 DIAGNOSIS — E119 Type 2 diabetes mellitus without complications: Secondary | ICD-10-CM | POA: Insufficient documentation

## 2015-03-12 DIAGNOSIS — Z85118 Personal history of other malignant neoplasm of bronchus and lung: Secondary | ICD-10-CM | POA: Insufficient documentation

## 2015-03-12 DIAGNOSIS — D649 Anemia, unspecified: Secondary | ICD-10-CM | POA: Insufficient documentation

## 2015-03-12 DIAGNOSIS — Z7902 Long term (current) use of antithrombotics/antiplatelets: Secondary | ICD-10-CM | POA: Insufficient documentation

## 2015-03-12 DIAGNOSIS — M1990 Unspecified osteoarthritis, unspecified site: Secondary | ICD-10-CM | POA: Insufficient documentation

## 2015-03-12 DIAGNOSIS — I10 Essential (primary) hypertension: Secondary | ICD-10-CM | POA: Diagnosis not present

## 2015-03-12 DIAGNOSIS — Z7982 Long term (current) use of aspirin: Secondary | ICD-10-CM | POA: Insufficient documentation

## 2015-03-12 DIAGNOSIS — E785 Hyperlipidemia, unspecified: Secondary | ICD-10-CM | POA: Insufficient documentation

## 2015-03-12 DIAGNOSIS — Z7984 Long term (current) use of oral hypoglycemic drugs: Secondary | ICD-10-CM | POA: Insufficient documentation

## 2015-03-12 DIAGNOSIS — N3281 Overactive bladder: Secondary | ICD-10-CM | POA: Insufficient documentation

## 2015-03-12 DIAGNOSIS — Z79899 Other long term (current) drug therapy: Secondary | ICD-10-CM | POA: Insufficient documentation

## 2015-03-12 DIAGNOSIS — Z87891 Personal history of nicotine dependence: Secondary | ICD-10-CM | POA: Insufficient documentation

## 2015-03-12 HISTORY — PX: INGUINAL HERNIA REPAIR: SHX194

## 2015-03-12 HISTORY — PX: INSERTION OF MESH: SHX5868

## 2015-03-12 LAB — GLUCOSE, CAPILLARY: GLUCOSE-CAPILLARY: 119 mg/dL — AB (ref 65–99)

## 2015-03-12 LAB — TYPE AND SCREEN
ABO/RH(D): A POS
Antibody Screen: NEGATIVE

## 2015-03-12 SURGERY — REPAIR, HERNIA, INGUINAL, LAPAROSCOPIC
Anesthesia: General | Site: Groin | Laterality: Left

## 2015-03-12 MED ORDER — ROCURONIUM BROMIDE 100 MG/10ML IV SOLN
INTRAVENOUS | Status: DC | PRN
  Administered 2015-03-12: 10 mg via INTRAVENOUS
  Administered 2015-03-12: 30 mg via INTRAVENOUS

## 2015-03-12 MED ORDER — ACETAMINOPHEN 325 MG PO TABS: 650.0000 mg | ORAL_TABLET | ORAL | Status: DC | PRN

## 2015-03-12 MED ORDER — PROPOFOL 10 MG/ML IV BOLUS
INTRAVENOUS | Status: DC | PRN
  Administered 2015-03-12: 150 mg via INTRAVENOUS

## 2015-03-12 MED ORDER — ARTIFICIAL TEARS OP OINT
TOPICAL_OINTMENT | OPHTHALMIC | Status: AC
  Filled 2015-03-12: qty 3.5

## 2015-03-12 MED ORDER — ACETAMINOPHEN 650 MG RE SUPP: 650.0000 mg | RECTAL | Status: DC | PRN

## 2015-03-12 MED ORDER — SODIUM CHLORIDE 0.9 % IJ SOLN: 3.0000 mL | Freq: Two times a day (BID) | INTRAMUSCULAR | Status: DC

## 2015-03-12 MED ORDER — 0.9 % SODIUM CHLORIDE (POUR BTL) OPTIME
TOPICAL | Status: DC | PRN
  Administered 2015-03-12: 1000 mL

## 2015-03-12 MED ORDER — OXYCODONE HCL 5 MG PO TABS: 5.0000 mg | ORAL_TABLET | ORAL | Status: DC | PRN

## 2015-03-12 MED ORDER — ROCURONIUM BROMIDE 50 MG/5ML IV SOLN
INTRAVENOUS | Status: AC
  Filled 2015-03-12: qty 2

## 2015-03-12 MED ORDER — LIDOCAINE HCL (CARDIAC) 20 MG/ML IV SOLN
INTRAVENOUS | Status: AC
  Filled 2015-03-12: qty 5

## 2015-03-12 MED ORDER — BUPIVACAINE HCL 0.25 % IJ SOLN
INTRAMUSCULAR | Status: DC | PRN
  Administered 2015-03-12: 30 mL

## 2015-03-12 MED ORDER — ROCURONIUM BROMIDE 50 MG/5ML IV SOLN
INTRAVENOUS | Status: AC
  Filled 2015-03-12: qty 1

## 2015-03-12 MED ORDER — SODIUM CHLORIDE 0.9 % IV SOLN: 250.0000 mL | INTRAVENOUS | Status: DC | PRN

## 2015-03-12 MED ORDER — PHENYLEPHRINE 40 MCG/ML (10ML) SYRINGE FOR IV PUSH (FOR BLOOD PRESSURE SUPPORT)
PREFILLED_SYRINGE | INTRAVENOUS | Status: AC
  Filled 2015-03-12: qty 10

## 2015-03-12 MED ORDER — ONDANSETRON HCL 4 MG/2ML IJ SOLN: 4.0000 mg | Freq: Once | INTRAMUSCULAR | Status: DC | PRN

## 2015-03-12 MED ORDER — SODIUM CHLORIDE 0.9 % IJ SOLN
INTRAMUSCULAR | Status: AC
  Filled 2015-03-12: qty 10

## 2015-03-12 MED ORDER — GLYCOPYRROLATE 0.2 MG/ML IJ SOLN
INTRAMUSCULAR | Status: DC | PRN
  Administered 2015-03-12: 0.4 mg via INTRAVENOUS

## 2015-03-12 MED ORDER — FENTANYL CITRATE (PF) 100 MCG/2ML IJ SOLN
INTRAMUSCULAR | Status: AC
  Administered 2015-03-12: 25 ug via INTRAVENOUS
  Filled 2015-03-12: qty 2

## 2015-03-12 MED ORDER — PROPOFOL 10 MG/ML IV BOLUS
INTRAVENOUS | Status: AC
  Filled 2015-03-12: qty 20

## 2015-03-12 MED ORDER — NEOSTIGMINE METHYLSULFATE 10 MG/10ML IV SOLN
INTRAVENOUS | Status: DC | PRN
  Administered 2015-03-12: 3 mg via INTRAVENOUS

## 2015-03-12 MED ORDER — ARTIFICIAL TEARS OP OINT
TOPICAL_OINTMENT | OPHTHALMIC | Status: DC | PRN
  Administered 2015-03-12: 1 via OPHTHALMIC

## 2015-03-12 MED ORDER — BUPIVACAINE HCL (PF) 0.25 % IJ SOLN
INTRAMUSCULAR | Status: AC
  Filled 2015-03-12: qty 30

## 2015-03-12 MED ORDER — MORPHINE SULFATE (PF) 2 MG/ML IV SOLN: 2.0000 mg | INTRAVENOUS | Status: DC | PRN

## 2015-03-12 MED ORDER — LIDOCAINE HCL (CARDIAC) 20 MG/ML IV SOLN
INTRAVENOUS | Status: DC | PRN
  Administered 2015-03-12: 100 mg via INTRAVENOUS

## 2015-03-12 MED ORDER — ONDANSETRON HCL 4 MG/2ML IJ SOLN
INTRAMUSCULAR | Status: AC
  Filled 2015-03-12: qty 2

## 2015-03-12 MED ORDER — LACTATED RINGERS IV SOLN
INTRAVENOUS | Status: DC
  Administered 2015-03-12: 50 mL/h via INTRAVENOUS
  Administered 2015-03-12: 12:00:00 via INTRAVENOUS

## 2015-03-12 MED ORDER — ONDANSETRON HCL 4 MG/2ML IJ SOLN
INTRAMUSCULAR | Status: DC | PRN
  Administered 2015-03-12: 4 mg via INTRAVENOUS

## 2015-03-12 MED ORDER — FENTANYL CITRATE (PF) 250 MCG/5ML IJ SOLN
INTRAMUSCULAR | Status: AC
  Filled 2015-03-12: qty 5

## 2015-03-12 MED ORDER — FENTANYL CITRATE (PF) 100 MCG/2ML IJ SOLN
25.0000 ug | INTRAMUSCULAR | Status: DC | PRN
  Administered 2015-03-12 (×2): 25 ug via INTRAVENOUS

## 2015-03-12 MED ORDER — SODIUM CHLORIDE 0.9 % IJ SOLN: 3.0000 mL | INTRAMUSCULAR | Status: DC | PRN

## 2015-03-12 MED ORDER — GLYCOPYRROLATE 0.2 MG/ML IJ SOLN
INTRAMUSCULAR | Status: AC
  Filled 2015-03-12: qty 3

## 2015-03-12 MED ORDER — OXYCODONE-ACETAMINOPHEN 5-325 MG PO TABS: 1.0000 | ORAL_TABLET | ORAL | Status: DC | PRN

## 2015-03-12 MED ORDER — FENTANYL CITRATE (PF) 100 MCG/2ML IJ SOLN
INTRAMUSCULAR | Status: DC | PRN
  Administered 2015-03-12 (×3): 50 ug via INTRAVENOUS

## 2015-03-12 MED ORDER — PHENYLEPHRINE HCL 10 MG/ML IJ SOLN
INTRAMUSCULAR | Status: DC | PRN
  Administered 2015-03-12: 40 ug via INTRAVENOUS
  Administered 2015-03-12 (×2): 80 ug via INTRAVENOUS

## 2015-03-12 MED ORDER — NEOSTIGMINE METHYLSULFATE 10 MG/10ML IV SOLN
INTRAVENOUS | Status: AC
  Filled 2015-03-12: qty 1

## 2015-03-12 SURGICAL SUPPLY — 54 items
APL SKNCLS STERI-STRIP NONHPOA (GAUZE/BANDAGES/DRESSINGS) ×1
APPLIER CLIP 5 13 M/L LIGAMAX5 (MISCELLANEOUS) ×3
APR CLP MED LRG 5 ANG JAW (MISCELLANEOUS) ×1
BENZOIN TINCTURE PRP APPL 2/3 (GAUZE/BANDAGES/DRESSINGS) ×3 IMPLANT
CANISTER SUCTION 2500CC (MISCELLANEOUS) IMPLANT
CHLORAPREP W/TINT 26ML (MISCELLANEOUS) ×3 IMPLANT
CLIP APPLIE 5 13 M/L LIGAMAX5 (MISCELLANEOUS) IMPLANT
CLOSURE WOUND 1/2 X4 (GAUZE/BANDAGES/DRESSINGS) ×1
COVER SURGICAL LIGHT HANDLE (MISCELLANEOUS) ×3 IMPLANT
DEVICE TROCAR PUNCTURE CLOSURE (ENDOMECHANICALS) ×2 IMPLANT
DISSECTOR BLUNT TIP ENDO 5MM (MISCELLANEOUS) IMPLANT
ELECT REM PT RETURN 9FT ADLT (ELECTROSURGICAL) ×3
ELECTRODE REM PT RTRN 9FT ADLT (ELECTROSURGICAL) ×1 IMPLANT
ENDOLOOP SUT PDS II  0 18 (SUTURE) ×2
ENDOLOOP SUT PDS II 0 18 (SUTURE) IMPLANT
GAUZE SPONGE 2X2 8PLY STRL LF (GAUZE/BANDAGES/DRESSINGS) ×1 IMPLANT
GLOVE BIO SURGEON STRL SZ7.5 (GLOVE) ×3 IMPLANT
GLOVE BIOGEL PI IND STRL 7.5 (GLOVE) IMPLANT
GLOVE BIOGEL PI INDICATOR 7.5 (GLOVE) ×2
GOWN STRL REUS W/ TWL LRG LVL3 (GOWN DISPOSABLE) ×2 IMPLANT
GOWN STRL REUS W/ TWL XL LVL3 (GOWN DISPOSABLE) ×1 IMPLANT
GOWN STRL REUS W/TWL LRG LVL3 (GOWN DISPOSABLE) ×6
GOWN STRL REUS W/TWL XL LVL3 (GOWN DISPOSABLE) ×3
KIT BASIN OR (CUSTOM PROCEDURE TRAY) ×3 IMPLANT
KIT ROOM TURNOVER OR (KITS) ×3 IMPLANT
MESH 3DMAX 4X6 LT LRG (Mesh General) ×2 IMPLANT
NDL INSUFFLATION 14GA 120MM (NEEDLE) IMPLANT
NEEDLE INSUFFLATION 14GA 120MM (NEEDLE) ×3 IMPLANT
NS IRRIG 1000ML POUR BTL (IV SOLUTION) ×3 IMPLANT
PAD ARMBOARD 7.5X6 YLW CONV (MISCELLANEOUS) ×6 IMPLANT
RELOAD STAPLE 4.0 BLU F/HERNIA (INSTRUMENTS) ×1 IMPLANT
RELOAD STAPLE 4.8 BLK F/HERNIA (STAPLE) IMPLANT
RELOAD STAPLE HERNIA 4.0 BLUE (INSTRUMENTS) ×3 IMPLANT
RELOAD STAPLE HERNIA 4.8 BLK (STAPLE) IMPLANT
SCISSORS LAP 5X35 DISP (ENDOMECHANICALS) ×3 IMPLANT
SET IRRIG TUBING LAPAROSCOPIC (IRRIGATION / IRRIGATOR) IMPLANT
SET TROCAR LAP APPLE-HUNT 5MM (ENDOMECHANICALS) ×3 IMPLANT
SLEEVE ENDOPATH XCEL 5M (ENDOMECHANICALS) ×2 IMPLANT
SPONGE GAUZE 2X2 STER 10/PKG (GAUZE/BANDAGES/DRESSINGS) ×2
STAPLER HERNIA 12 8.5 360D (INSTRUMENTS) ×3 IMPLANT
STRIP CLOSURE SKIN 1/2X4 (GAUZE/BANDAGES/DRESSINGS) ×2 IMPLANT
SUT MNCRL AB 4-0 PS2 18 (SUTURE) ×3 IMPLANT
SUT VIC AB 1 CT1 27 (SUTURE)
SUT VIC AB 1 CT1 27XBRD ANBCTR (SUTURE) IMPLANT
SUT VICRYL 0 UR6 27IN ABS (SUTURE) ×2 IMPLANT
TOWEL OR 17X24 6PK STRL BLUE (TOWEL DISPOSABLE) ×3 IMPLANT
TOWEL OR 17X26 10 PK STRL BLUE (TOWEL DISPOSABLE) ×3 IMPLANT
TRAY FOLEY CATH 16FR SILVER (SET/KITS/TRAYS/PACK) ×3 IMPLANT
TRAY LAPAROSCOPIC MC (CUSTOM PROCEDURE TRAY) ×3 IMPLANT
TROCAR BLADELESS 11MM (ENDOMECHANICALS) IMPLANT
TROCAR BLADELESS 12MM (ENDOMECHANICALS) ×2 IMPLANT
TROCAR BLADELESS 5MM (ENDOMECHANICALS) ×2 IMPLANT
TROCAR XCEL 12X100 BLDLESS (ENDOMECHANICALS) ×3 IMPLANT
TUBING INSUFFLATION (TUBING) ×3 IMPLANT

## 2015-03-12 NOTE — Anesthesia Preprocedure Evaluation (Addendum)
Anesthesia Evaluation  Patient identified by MRN, date of birth, ID band Patient awake    Reviewed: Allergy & Precautions, NPO status , Patient's Chart, lab work & pertinent test results  History of Anesthesia Complications Negative for: history of anesthetic complications  Airway Mallampati: II  TM Distance: >3 FB Neck ROM: Full    Dental  (+) Edentulous Upper, Edentulous Lower, Dental Advisory Given   Pulmonary former smoker,  Lung cancer - s/p right lower lobe wedge resection 01/26/15   Pulmonary exam normal breath sounds clear to auscultation       Cardiovascular hypertension, Pt. on medications (-) angina+ CAD and + Past MI  (-) Cardiac Stents Normal cardiovascular exam Rhythm:Regular Rate:Normal  Echo and stress test sept, 2016: EF 55%, no sig valve abnormalities, low risk stress test  Cardiology clearance 03/02/15, off plavix for 7 days   Neuro/Psych TIA   GI/Hepatic Neg liver ROS, GERD  Medicated,  Endo/Other  diabetes, Well Controlled, Type 2, Oral Hypoglycemic Agents  Renal/GU negative Renal ROS     Musculoskeletal  (+) Arthritis , Osteoarthritis,    Abdominal   Peds  Hematology  (+) Blood dyscrasia, anemia ,   Anesthesia Other Findings   Reproductive/Obstetrics                           Anesthesia Physical  Anesthesia Plan  ASA: III  Anesthesia Plan: General   Post-op Pain Management:    Induction: Intravenous  Airway Management Planned: Oral ETT  Additional Equipment:   Intra-op Plan:   Post-operative Plan: Extubation in OR  Informed Consent: I have reviewed the patients History and Physical, chart, labs and discussed the procedure including the risks, benefits and alternatives for the proposed anesthesia with the patient or authorized representative who has indicated his/her understanding and acceptance.   Dental advisory given  Plan Discussed with: CRNA,  Anesthesiologist and Surgeon  Anesthesia Plan Comments:         Anesthesia Quick Evaluation Hx of positive stress test leading to cardiac cath in 2010 with diffuse RCA disease of 30-40%, LCX 50%, 1st OM 60-70%, LAD 30-40% 1st diag 60%. EF 50-55%. Echo in 2012 after CVA with EF 55-60%.

## 2015-03-12 NOTE — H&P (View-Only) (Signed)
History of Present Illness Ralene Ok MD; 02/22/2015 3:00 PM) Patient words: evaluate umbilical hernia.  The patient is a 81 year old male who presents with an inguinal hernia. The patient is a 81 year old male who is referred by Lorene Dy for evaluation of a left inguinal hernia. The patient states that several months ago he had a hernia in bulge that was sensed and diagnosed on CT scan, and the hernia was also seen to be fat-containing.. CT scan also revealed a right lower lobe mass. Subsequent to this Dr. Servando Snare proceeded with wedge resection of the lung mass. This was approximate 4 weeks ago. The patient's been doing well from that surgery. The patient states that several days ago he began with left inguinal pain that was similar to his previous hernia pain. He states that he is unsure when the pain is going showup have symptoms from it. He states he would like to proceed with surgery to avoid any future pain or complications from the hernia. Patient currently sees Dr. Stanford Breed as his cardiologist. He is currently on Plavix.   Other Problems Ivor Costa, [place]; 02/22/2015 2:19 PM) Arthritis Cerebrovascular Accident Diabetes Mellitus High blood pressure Lung Cancer  Past Surgical History Ivor Costa, [place]; 02/22/2015 2:19 PM) Cataract Surgery Bilateral. Colon Polyp Removal - Colonoscopy Hip Surgery Right. Lung Surgery Right. Prostate Surgery - Removal Thyroid Surgery  Allergies Ivor Costa, CMA; 02/22/2015 2:20 PM) Diagnostic *DIAGNOSTIC PRODUCTS* Toviaz *URINARY ANTISPASMODICS*  Medication History Ivor Costa, CMA; 02/22/2015 2:27 PM) Norvasc ('5MG'$  Tablet, Oral) Active. Aspirin ('81MG'$  Tablet, Oral) Active. Besivance (0.6% Suspension, Ophthalmic) Active. Fioricet/Codeine (50-300-40-'30MG'$  Capsule, Oral) Active. Tagamet ('400MG'$  Tablet, Oral) Active. Plavix ('75MG'$  Tablet, Oral) Active. Fish Oil ('1000MG'$  Capsule, Oral) Active. Antivert  ('25MG'$  Tablet, Oral) Active. MetFORMIN HCl ('1000MG'$  Tablet, Oral) Active. Myrbetriq ('50MG'$  Tablet ER 24HR, Oral) Active. Crestor ('10MG'$  Tablet, Oral) Active. Ultram ('50MG'$  Tablet, Oral) Active. Kenalog (0.1% Cream, External) Active. Medications Reconciled  Social History Ivor Costa, [place]; 02/22/2015 2:19 PM) Caffeine use Carbonated beverages, Coffee. No alcohol use No drug use Tobacco use Former smoker.  Family History Ivor Costa, [place]; 02/22/2015 2:19 PM) Arthritis Sister. Cancer Brother. Cerebrovascular Accident Sister. Colon Cancer Daughter. Diabetes Mellitus Sister. Heart Disease Brother, Sister.  Review of Systems Ivor Costa CMA; 02/22/2015 2:19 PM) General Present- Appetite Loss, Fatigue and Night Sweats. Not Present- Chills, Fever, Weight Gain and Weight Loss. Skin Not Present- Change in Wart/Mole, Dryness, Hives, Jaundice, New Lesions, Non-Healing Wounds, Rash and Ulcer. HEENT Present- Hearing Loss and Wears glasses/contact lenses. Not Present- Earache, Hoarseness, Nose Bleed, Oral Ulcers, Ringing in the Ears, Seasonal Allergies, Sinus Pain, Sore Throat, Visual Disturbances and Yellow Eyes. Respiratory Present- Difficulty Breathing. Not Present- Bloody sputum, Chronic Cough, Snoring and Wheezing. Cardiovascular Not Present- Chest Pain, Difficulty Breathing Lying Down, Leg Cramps, Palpitations, Rapid Heart Rate, Shortness of Breath and Swelling of Extremities. Gastrointestinal Present- Bloating and Excessive gas. Not Present- Abdominal Pain, Bloody Stool, Change in Bowel Habits, Chronic diarrhea, Constipation, Difficulty Swallowing, Gets full quickly at meals, Hemorrhoids, Indigestion, Nausea, Rectal Pain and Vomiting. Male Genitourinary Present- Urine Leakage. Not Present- Blood in Urine, Change in Urinary Stream, Frequency, Impotence, Nocturia, Painful Urination and Urgency. Musculoskeletal Not Present- Back Pain, Joint Pain, Joint Stiffness, Muscle Pain,  Muscle Weakness and Swelling of Extremities. Neurological Present- Trouble walking and Weakness. Not Present- Decreased Memory, Fainting, Headaches, Numbness, Seizures, Tingling and Tremor.   Vitals Ivor Costa CMA; 02/22/2015 2:19 PM) 02/22/2015 2:18 PM Weight: 164 lb Temp.: 97.44F(Temporal)  Pulse: 80 (Regular)  Resp.:  18 (Unlabored)  BP: 138/80 (Sitting, Left Arm, Standard)    Physical Exam Ralene Ok MD; 02/22/2015 2:58 PM) General Mental Status-Alert. General Appearance-Consistent with stated age. Hydration-Well hydrated. Voice-Normal.  Head and Neck Head-normocephalic, atraumatic with no lesions or palpable masses. Trachea-midline.  Eye Eyeball - Bilateral-Extraocular movements intact. Sclera/Conjunctiva - Bilateral-No scleral icterus.  Chest and Lung Exam Chest and lung exam reveals -quiet, even and easy respiratory effort with no use of accessory muscles. Inspection Chest Wall - Normal. Back - normal.  Cardiovascular Cardiovascular examination reveals -normal heart sounds, regular rate and rhythm with no murmurs.  Abdomen Inspection Skin - Scar - no surgical scars. Hernias - Inguinal hernia - Left - Reducible. Palpation/Percussion Normal exam - Soft, Non Tender, No Rebound tenderness, No Rigidity (guarding) and No hepatosplenomegaly. Auscultation Normal exam - Bowel sounds normal.  Neurologic Neurologic evaluation reveals -alert and oriented x 3 with no impairment of recent or remote memory. Mental Status-Normal.  Musculoskeletal Normal Exam - Left-Upper Extremity Strength Normal and Lower Extremity Strength Normal. Normal Exam - Right-Upper Extremity Strength Normal, Lower Extremity Weakness.    Assessment & Plan Ralene Ok MD; 02/22/2015 3:01 PM) LEFT INGUINAL HERNIA (K40.90) Impression: 81 year old male with a left reducible inguinal hernia, fat-containing, likely indirect  1. Will obtain cardiac  clearance from Dr. Stanford Breed. Patient recently underwent care Dr. Roxy Horseman and underwent a left lower pulmonary wedge resection. The patient is also on Plavix. This will need to be stopped prior to surgery. 2. The patient will like to proceed to the operating room for laparoscopic left inguinal hernia repair.  3. I discussed with the patient the signs and symptoms of incarceration and strangulation and the need to proceed to the ER should they occur.  4. I discussed with the patient the risks and benefits of the procedure to include but not limited to: Infection, bleeding, damage to surrounding structures, possible need for further surgery, possible nerve pain, and possible recurrence. The patient was understanding and wishes to proceed.

## 2015-03-12 NOTE — Anesthesia Postprocedure Evaluation (Signed)
  Anesthesia Post-op Note  Patient: Garrett Henson  Procedure(s) Performed: Procedure(s) (LRB): LAPAROSCOPIC LEFT INGUINAL HERNIA REPAIR WITH MESH (Left) INSERTION OF MESH (Left)  Patient Location: PACU  Anesthesia Type: General  Level of Consciousness: awake and alert   Airway and Oxygen Therapy: Patient Spontanous Breathing  Post-op Pain: mild  Post-op Assessment: Post-op Vital signs reviewed, Patient's Cardiovascular Status Stable, Respiratory Function Stable, Patent Airway and No signs of Nausea or vomiting  Last Vitals:  Filed Vitals:   03/12/15 0906  BP: 168/61  Pulse: 81  Temp: 36.8 C  Resp: 18    Post-op Vital Signs: stable   Complications: No apparent anesthesia complications

## 2015-03-12 NOTE — Discharge Instructions (Signed)
[facility] _______[facility]  INGUINAL HERNIA REPAIR: POST OP INSTRUCTIONS  Always review your discharge instruction sheet given to you by the facility where your surgery was performed. IF YOU HAVE DISABILITY OR FAMILY LEAVE FORMS, YOU MUST BRING THEM TO THE OFFICE FOR PROCESSING.   DO NOT GIVE THEM TO YOUR DOCTOR.  1. A  prescription for pain medication may be given to you upon discharge.  Take your pain medication as prescribed, if needed.  If narcotic pain medicine is not needed, then you may take acetaminophen (Tylenol) or ibuprofen (Advil) as needed. 2. Take your usually prescribed medications unless otherwise directed. 3. If you need a refill on your pain medication, please contact your pharmacy.  They will contact our office to request authorization. Prescriptions will not be filled after 5 pm or on week-ends. 4. You should follow a light diet the first 24 hours after arrival home, such as soup and crackers, etc.  Be sure to include lots of fluids daily.  Resume your normal diet the day after surgery. 5. Most patients will experience some swelling and bruising around the umbilicus or in the groin and scrotum.  Ice packs and reclining will help.  Swelling and bruising can take several days to resolve.  6. It is common to experience some constipation if taking pain medication after surgery.  Increasing fluid intake and taking a stool softener (such as Colace) will usually help or prevent this problem from occurring.  A mild laxative (Milk of Magnesia or Miralax) should be taken according to package directions if there are no bowel movements after 48 hours. 7. Unless discharge instructions indicate otherwise, you may remove your bandages 24-48 hours after surgery, and you may shower at that time.  You may have steri-strips (small skin tapes) in place directly over the incision.  These strips should be left on the skin for 7-10 days.  If your surgeon used skin glue on the incision, you  may shower in 24 hours.  The glue will flake off over the next 2-3 weeks.  Any sutures or staples will be removed at the office during your follow-up visit. 8. ACTIVITIES:  You may resume regular (light) daily activities beginning the next day--such as daily self-care, walking, climbing stairs--gradually increasing activities as tolerated.  You may have sexual intercourse when it is comfortable.  Refrain from any heavy lifting or straining until approved by your doctor. a. You may drive when you are no longer taking prescription pain medication, you can comfortably wear a seatbelt, and you can safely maneuver your car and apply brakes. b. RETURN TO WORK:  __________________________________________________________ 9. You should see your doctor in the office for a follow-up appointment approximately 2-3 weeks after your surgery.  Make sure that you call for this appointment within a day or two after you arrive home to insure a convenient appointment time. 10. OTHER INSTRUCTIONS:  __________________________________________________________________________________________________________________________________________________________________________________________  WHEN TO CALL YOUR DOCTOR: 1. Fever over 101.0 2. Inability to urinate 3. Nausea and/or vomiting 4. Extreme swelling or bruising 5. Continued bleeding from incision. 6. Increased pain, redness, or drainage from the incision  The clinic staff is available to answer your questions during regular business hours.  Please dont hesitate to call and ask to speak to one of the nurses for clinical concerns.  If you have a medical emergency, go to the nearest emergency room or call 911.  A surgeon from [facility] is always on call at the hospital   [address], [address], [place], [place]  [address] ? ° [address] [address], [place], [place]   [address] °[phone] ? [phone] ? FAX [phone] °Web site:  [email or web address] ° °

## 2015-03-12 NOTE — Transfer of Care (Signed)
Immediate Anesthesia Transfer of Care Note  Patient: Garrett Henson  Procedure(s) Performed: Procedure(s): LAPAROSCOPIC LEFT INGUINAL HERNIA REPAIR WITH MESH (Left) INSERTION OF MESH (Left)  Patient Location: PACU  Anesthesia Type:General  Level of Consciousness: awake, alert , oriented and sedated  Airway & Oxygen Therapy: Patient Spontanous Breathing and Patient connected to nasal cannula oxygen  Post-op Assessment: Report given to RN, Post -op Vital signs reviewed and stable and Patient moving all extremities  Post vital signs: Reviewed and stable  Last Vitals:  Filed Vitals:   03/12/15 0906  BP: 168/61  Pulse: 81  Temp: 36.8 C  Resp: 18    Complications: No apparent anesthesia complications

## 2015-03-12 NOTE — Op Note (Signed)
03/12/2015  PATIENT:  Garrett Henson  81 y.o. male  PRE-OPERATIVE DIAGNOSIS:  LEFT INGUINAL HERNIA  POST-OPERATIVE DIAGNOSIS:  LEFT INGUINAL HERNIA  PROCEDURE:  Procedure(s): LAPAROSCOPIC LEFT INGUINAL HERNIA REPAIR WITH MESH (Left) INSERTION OF MESH (Left)  SURGEON:  Surgeon(s) and Role:    * Ralene Ok, MD - Primary  PHYSICIAN ASSISTANT:   ASSISTANTS: none   ANESTHESIA:   local and general  EBL:   <5cc  BLOOD ADMINISTERED:none  DRAINS: none   LOCAL MEDICATIONS USED:  BUPIVICAINE   SPECIMEN:  No Specimen  DISPOSITION OF SPECIMEN:  N/A  COUNTS:  YES  TOURNIQUET:  * No tourniquets in log *  DICTATION: .Dragon Dictation   Counts: reported as correct x 2  Findings:  The patient had a small left indirect hernia  Indications for procedure:  The patient is a 81 year old male with a left hernia for several months. Patient complained of symptomatology to his left inguinal area. The patient was taken back for elective inguinal hernia repair.  Details of the procedure: The patient was taken back to the operating room. The patient was placed in supine position with bilateral SCDs in place.  The patient was prepped and draped in the usual sterile fashion.  After appropriate anitbiotics were confirmed, a time-out was confirmed and all facts were verified.  0.25% Marcaine was used to infiltrate the umbilical area. A 11-blade was used to cut down the skin and blunt dissection was used to get the anterior fashion.  The anterior fascia was incised approximately 1 cm and the muscles were retracted laterally. Blunt dissection was then used to create a space in the preperitoneal area. At this time a 10 mm camera was then introduced into the space and advanced the pubic tubercle and a 12 mm trocar was placed over this and insufflation was started.  At this time and space was created from medial to laterally the preperitoneal space.  There was a dense scar tissue from his previous  operation.  This made the dissection very difficult.  Cooper's ligament was initially cleaned off.  The hernia sac was identified in the indirect space. Dissection of the hernia sac was undertaken the vas deferens was identified and protected in all parts of the case.  There was a small tear into the hernia sac. A Veress needle right upper quadrant to help evacuate the intraperitoneal air.    Once the hernia sac was taken down to approximately the umbilicus a Bard 3D Max mesh, size: Large, was  introduced into the preperitoneal space.  The mesh was brought over to cover the direct and indirect hernia spaces.  This was anchored into place and secured to Cooper's ligament with 4.35m staples from a [place] hernia stapler. It was anchored to the anterior abdominal wall with 4.8 mm staples. The hernia sac was seen lying posterior to the mesh. There was no staples placed laterally.   Secondary to the tear in the peritoneum I proceeded to place a 583mtrochar in the LLQ.  The camera was advacnced and the peritoneal tear was visualized with mesh exposed.  A 1218mrochar was placed just inferior to this.  The hernia sac was than stapled to cover the peritoneal defect.  Omentum was than laid over the tear.  The insufflation was evacuated and the peritoneum was seen posterior to the mesh. The trochars were removed. The anterior fascia was reapproximated using #1 Vicryl on a UR- 6.  Intra-abdominal air was evacuated and the Veress needle removed.  The skin on all trochar sites was reapproximated using 4-0 Monocryl subcuticular fashion the patient was awakened from general anesthesia and taken to recovery in stable condition.   PLAN OF CARE: Discharge to home after PACU  PATIENT DISPOSITION:  PACU - hemodynamically stable.   Delay start of Pharmacological VTE agent (>24hrs) due to surgical blood loss or risk of bleeding: not applicable

## 2015-03-12 NOTE — Anesthesia Procedure Notes (Signed)
Procedure Name: Intubation Date/Time: 03/12/2015 12:02 PM Performed by: Scheryl Darter Pre-anesthesia Checklist: Patient identified, Emergency Drugs available, Suction available, Patient being monitored and Timeout performed Patient Re-evaluated:Patient Re-evaluated prior to inductionOxygen Delivery Method: Circle system utilized Preoxygenation: Pre-oxygenation with 100% oxygen Intubation Type: IV induction Ventilation: Mask ventilation without difficulty Laryngoscope Size: Mac and 4 Grade View: Grade I Tube type: Oral Tube size: 8.0 mm Number of attempts: 1 Airway Equipment and Method: Stylet Placement Confirmation: ETT inserted through vocal cords under direct vision,  positive ETCO2 and breath sounds checked- equal and bilateral Secured at: 23 cm Tube secured with: Tape Dental Injury: Teeth and Oropharynx as per pre-operative assessment

## 2015-03-12 NOTE — Interval H&P Note (Signed)
History and Physical Interval Note:  03/12/2015 7:24 AM  Garrett Henson  has presented today for surgery, with the diagnosis of LEFT INGUINAL HERNIA  The various methods of treatment have been discussed with the patient and family. After consideration of risks, benefits and other options for treatment, the patient has consented to  Procedure(s): LAPAROSCOPIC LEFT INGUINAL HERNIA REPAIR WITH MESH (Left) INSERTION OF MESH (N/A) as a surgical intervention .  The patient's history has been reviewed, patient examined, no change in status, stable for surgery.  I have reviewed the patient's chart and labs.  Questions were answered to the patient's satisfaction.     Rosario Jacks., Anne Hahn

## 2015-03-15 ENCOUNTER — Encounter (HOSPITAL_COMMUNITY): Payer: Self-pay | Admitting: General Surgery

## 2015-03-17 ENCOUNTER — Ambulatory Visit: Payer: Medicare Other | Admitting: Cardiothoracic Surgery

## 2015-03-19 ENCOUNTER — Ambulatory Visit (HOSPITAL_COMMUNITY)
Admission: RE | Admit: 2015-03-19 | Discharge: 2015-03-19 | Disposition: A | Discharge status: Home / Self Care | Payer: Medicare Other | Source: Ambulatory Visit | Attending: Cardiovascular Disease | Admitting: Cardiovascular Disease

## 2015-03-19 DIAGNOSIS — I679 Cerebrovascular disease, unspecified: Secondary | ICD-10-CM | POA: Insufficient documentation

## 2015-03-19 DIAGNOSIS — I10 Essential (primary) hypertension: Secondary | ICD-10-CM | POA: Diagnosis not present

## 2015-03-19 DIAGNOSIS — E119 Type 2 diabetes mellitus without complications: Secondary | ICD-10-CM | POA: Insufficient documentation

## 2015-03-19 DIAGNOSIS — I6523 Occlusion and stenosis of bilateral carotid arteries: Secondary | ICD-10-CM | POA: Insufficient documentation

## 2015-03-19 DIAGNOSIS — E785 Hyperlipidemia, unspecified: Secondary | ICD-10-CM | POA: Insufficient documentation

## 2015-03-22 ENCOUNTER — Other Ambulatory Visit: Payer: Self-pay | Admitting: Cardiothoracic Surgery

## 2015-03-22 DIAGNOSIS — C3490 Malignant neoplasm of unspecified part of unspecified bronchus or lung: Secondary | ICD-10-CM

## 2015-03-23 ENCOUNTER — Encounter: Payer: Self-pay | Admitting: Cardiothoracic Surgery

## 2015-03-23 ENCOUNTER — Ambulatory Visit
Admission: RE | Admit: 2015-03-23 | Discharge: 2015-03-23 | Disposition: A | Discharge status: Home / Self Care | Payer: Medicare Other | Source: Ambulatory Visit | Attending: Cardiothoracic Surgery | Admitting: Cardiothoracic Surgery

## 2015-03-23 ENCOUNTER — Ambulatory Visit (INDEPENDENT_AMBULATORY_CARE_PROVIDER_SITE_OTHER): Payer: Self-pay | Admitting: Cardiothoracic Surgery

## 2015-03-23 ENCOUNTER — Encounter: Payer: Self-pay | Admitting: Cardiology

## 2015-03-23 VITALS — BP 130/70 | HR 90 | Resp 20 | Ht 71.0 in | Wt 163.0 lb

## 2015-03-23 DIAGNOSIS — C3490 Malignant neoplasm of unspecified part of unspecified bronchus or lung: Secondary | ICD-10-CM

## 2015-03-23 DIAGNOSIS — Z09 Encounter for follow-up examination after completed treatment for conditions other than malignant neoplasm: Secondary | ICD-10-CM

## 2015-03-23 DIAGNOSIS — C3431 Malignant neoplasm of lower lobe, right bronchus or lung: Secondary | ICD-10-CM

## 2015-03-23 NOTE — Telephone Encounter (Signed)
This encounter was created in error - please disregard.

## 2015-03-23 NOTE — Telephone Encounter (Signed)
Pt is returning Debra's call . Possibly about some results from his carotid that was done on 11/4. Please call  Thanks

## 2015-03-23 NOTE — Progress Notes (Signed)
[address]       Garrett Henson,Garrett Henson [address]             [phone]      Garrett Henson  Medical Record #[id number] Date of Birth: 29-Apr-1925  Referring: Curt Bears, MD Primary Care: Myriam Jacobson, MD  Chief Complaint:   POST OP FOLLOW UP 01/26/2015 OPERATIVE REPORT PREOPERATIVE DIAGNOSIS: Right lower lobe lung mass. POSTOPERATIVE DIAGNOSIS: Non-small cell carcinoma of the lung, right lower lobe. PROCEDURES PERFORMED: Bronchoscopy, right video-assisted thoracoscopy with wedge resection of right lower lobe nodule, and lymph node sampling. SURGEON: Lanelle Bal, MD  Lung cancer [facility])   Staging form: Lung, AJCC 7th Edition     Pathologic stage from 01/27/2015: Stage IA (T1a, N0, cM0) - Signed by [name], MD    History of Present Illness:     Patient doing well postop, increasing activity , no sob. He has had  left inguinal hernia repaired since last seen     Past Medical History  Diagnosis Date  . Lung mass 12/01/2014  . Lung mass 12/01/2014  . Hypertension     takes Amlodipine daily  . Vertigo     takes Antivert daily as needed  . Diabetes mellitus     takes Metformin daily  . Hyperlipidemia     takes Crestor daily  . Overactive bladder     takes Myrbetriq daily  . Coronary artery disease     takes Plavix daily  . GERD (gastroesophageal reflux disease)     takes tagamet daily  . History of colon polyps     benign  . History of migraine     many yrs ago  . TIA (transient ischemic attack)   . Arthritis   . Joint swelling   . Back pain   . Urinary frequency   . Urinary urgency   . Prostate cancer ([place])   . Nocturia   . History of blood transfusion     no abnormal reaction noted  . Macular degeneration     wet and on the left;injection 01/20/15  . Stroke ([place])   . Headache     HX MIGRAINES     History  Smoking status  . Former Smoker -- 0.50 packs/day for 30 years  . Quit date: 06/05/1959    Smokeless tobacco  . Not on file    History  Alcohol Use No     Allergies  Allergen Reactions  . Ivp Dye [Iodinated Diagnostic Agents] Shortness Of Breath  . Toviaz [Fesoterodine Fumarate Er] Swelling    Mouth swelling    Current Outpatient Prescriptions  Medication Sig Dispense Refill  . amLODipine (NORVASC) 5 MG tablet Take 5 mg by mouth at bedtime.     Marland Kitchen aspirin EC 81 MG tablet Take 81 mg by mouth daily.    Marland Kitchen Besifloxacin HCl (BESIVANCE) 0.6 % SUSP Place 1 drop into the left eye See admin instructions. Instil 1 drop into left eye 4 times on the day of eye injection and 4 times on the next day (injections are monthly - last injection 01/20/15)    . Butalbital-APAP-Caffeine (FIORICET) 50-300-40 MG CAPS Take 1 tablet by mouth 2 (two) times daily as needed (migraines).     . cimetidine (TAGAMET) 400 MG tablet Take 400 mg by mouth 2 (two) times daily as needed (heartburn/ acid reflux).     . clopidogrel (PLAVIX) 75 MG tablet Take 75 mg by mouth daily.     Marland Kitchen  meclizine (ANTIVERT) 25 MG tablet Take 25 mg by mouth daily as needed for dizziness.     . metFORMIN (GLUCOPHAGE) 1000 MG tablet Take 1,000 mg by mouth 2 (two) times daily with a meal.      . mirabegron ER (MYRBETRIQ) 50 MG TB24 tablet Take 50 mg by mouth daily.    . Multiple Vitamin (MULTIVITAMIN WITH MINERALS) TABS tablet Take 1 tablet by mouth at bedtime.    Marland Kitchen oxyCODONE-acetaminophen (ROXICET) 5-325 MG tablet Take 1-2 tablets by mouth every 4 (four) hours as needed. 30 tablet 0  . rosuvastatin (CRESTOR) 10 MG tablet Take 5 mg by mouth 3 (three) times a week.     . triamcinolone cream (KENALOG) 0.1 % Apply 1 application topically daily as needed (rash on groin and/or legs).      No current facility-administered medications for this visit.       Physical Exam: Ht '5\' 11"'$  (1.803 m)  Wt 163 lb (73.936 kg)  BMI 22.74 kg/m2  SpO2   General appearance: alert and cooperative Neurologic: intact Heart: regular rate and rhythm,  S1, S2 normal, no murmur, click, rub or gallop Lungs: clear to auscultation bilaterally Abdomen: soft, non-tender; bowel sounds normal; no masses,  no organomegaly Extremities: extremities normal, atraumatic, no cyanosis or edema Wound: wounds well healed Left inguinal hernia recent repair wound ok  Diagnostic Studies & Laboratory data:     Recent Radiology Findings:   Dg Chest 2 View  03/23/2015  CLINICAL DATA:  Status post wedge resection of a malignant right lower lobe mass on January 26, 2015; follow-up study. EXAM: CHEST  2 VIEW COMPARISON:  PA and lateral chest x-ray of February 25, 2015 FINDINGS: Soft tissue density in the right perihilar region has decreased. There remains a small right pleural effusion. There is no pneumothorax. The left lung is well-expanded and clear. The heart is top-normal in size. The pulmonary vascularity is normal. There is mild tortuosity of the descending thoracic aorta. There is stable wedge compression of the body of T12. IMPRESSION: Interval decrease in the right perihilar density in the posterior aspect of the right mid lung. Stable small right pleural effusion. Electronically Signed   By: David  Martinique M.D.   On: 03/23/2015 14:52    mass was removed- postop changes evident, will get follow up chest xray 3 weeks  Recent Lab Findings: Lab Results  Component Value Date   WBC 5.6 03/10/2015   HGB 8.9* 03/10/2015   HCT 28.6* 03/10/2015   PLT 365 03/10/2015   GLUCOSE 148* 03/10/2015   CHOL 145 04/23/2011   TRIG 77 04/23/2011   HDL 59 04/23/2011   LDLCALC 71 04/23/2011   ALT 13* 01/28/2015   AST 25 01/28/2015   NA 137 03/10/2015   K 4.2 03/10/2015   CL 100* 03/10/2015   CREATININE 0.82 03/10/2015   BUN 21* 03/10/2015   CO2 22 03/10/2015   INR 1.05 01/22/2015   HGBA1C 7.5* 03/10/2015      Assessment / Plan:   Return 47month with repeat chest xray, plan ct of chest 6 months post op Doing well post op   [name]      [address] [place] [address]Office [phone]  Beeper [phone] 03/23/2015 3:51 PM

## 2015-03-25 ENCOUNTER — Encounter (INDEPENDENT_AMBULATORY_CARE_PROVIDER_SITE_OTHER): Payer: Medicare Other | Admitting: Ophthalmology

## 2015-03-25 DIAGNOSIS — I10 Essential (primary) hypertension: Secondary | ICD-10-CM

## 2015-03-25 DIAGNOSIS — H35033 Hypertensive retinopathy, bilateral: Secondary | ICD-10-CM

## 2015-03-25 DIAGNOSIS — H353121 Nonexudative age-related macular degeneration, left eye, early dry stage: Secondary | ICD-10-CM

## 2015-03-25 DIAGNOSIS — H43813 Vitreous degeneration, bilateral: Secondary | ICD-10-CM | POA: Diagnosis not present

## 2015-03-25 DIAGNOSIS — H353112 Nonexudative age-related macular degeneration, right eye, intermediate dry stage: Secondary | ICD-10-CM | POA: Diagnosis not present

## 2015-03-25 DIAGNOSIS — E113391 Type 2 diabetes mellitus with moderate nonproliferative diabetic retinopathy without macular edema, right eye: Secondary | ICD-10-CM

## 2015-03-25 DIAGNOSIS — E11311 Type 2 diabetes mellitus with unspecified diabetic retinopathy with macular edema: Secondary | ICD-10-CM | POA: Diagnosis not present

## 2015-03-25 DIAGNOSIS — E113312 Type 2 diabetes mellitus with moderate nonproliferative diabetic retinopathy with macular edema, left eye: Secondary | ICD-10-CM | POA: Diagnosis not present

## 2015-04-22 ENCOUNTER — Encounter (INDEPENDENT_AMBULATORY_CARE_PROVIDER_SITE_OTHER): Payer: Medicare Other | Admitting: Ophthalmology

## 2015-04-22 DIAGNOSIS — E113291 Type 2 diabetes mellitus with mild nonproliferative diabetic retinopathy without macular edema, right eye: Secondary | ICD-10-CM

## 2015-04-22 DIAGNOSIS — H353112 Nonexudative age-related macular degeneration, right eye, intermediate dry stage: Secondary | ICD-10-CM | POA: Diagnosis not present

## 2015-04-22 DIAGNOSIS — I10 Essential (primary) hypertension: Secondary | ICD-10-CM

## 2015-04-22 DIAGNOSIS — E11311 Type 2 diabetes mellitus with unspecified diabetic retinopathy with macular edema: Secondary | ICD-10-CM | POA: Diagnosis not present

## 2015-04-22 DIAGNOSIS — H35033 Hypertensive retinopathy, bilateral: Secondary | ICD-10-CM

## 2015-04-22 DIAGNOSIS — H43813 Vitreous degeneration, bilateral: Secondary | ICD-10-CM | POA: Diagnosis not present

## 2015-04-22 DIAGNOSIS — H353221 Exudative age-related macular degeneration, left eye, with active choroidal neovascularization: Secondary | ICD-10-CM | POA: Diagnosis not present

## 2015-04-22 DIAGNOSIS — E113212 Type 2 diabetes mellitus with mild nonproliferative diabetic retinopathy with macular edema, left eye: Secondary | ICD-10-CM

## 2015-05-20 ENCOUNTER — Encounter (INDEPENDENT_AMBULATORY_CARE_PROVIDER_SITE_OTHER): Payer: Medicare Other | Admitting: Ophthalmology

## 2015-05-20 DIAGNOSIS — E113291 Type 2 diabetes mellitus with mild nonproliferative diabetic retinopathy without macular edema, right eye: Secondary | ICD-10-CM | POA: Diagnosis not present

## 2015-05-20 DIAGNOSIS — E113312 Type 2 diabetes mellitus with moderate nonproliferative diabetic retinopathy with macular edema, left eye: Secondary | ICD-10-CM | POA: Diagnosis not present

## 2015-05-20 DIAGNOSIS — H353221 Exudative age-related macular degeneration, left eye, with active choroidal neovascularization: Secondary | ICD-10-CM | POA: Diagnosis not present

## 2015-05-20 DIAGNOSIS — I10 Essential (primary) hypertension: Secondary | ICD-10-CM | POA: Diagnosis not present

## 2015-05-20 DIAGNOSIS — H35033 Hypertensive retinopathy, bilateral: Secondary | ICD-10-CM | POA: Diagnosis not present

## 2015-05-20 DIAGNOSIS — H43813 Vitreous degeneration, bilateral: Secondary | ICD-10-CM

## 2015-05-20 DIAGNOSIS — H353112 Nonexudative age-related macular degeneration, right eye, intermediate dry stage: Secondary | ICD-10-CM | POA: Diagnosis not present

## 2015-05-20 DIAGNOSIS — E11311 Type 2 diabetes mellitus with unspecified diabetic retinopathy with macular edema: Secondary | ICD-10-CM | POA: Diagnosis not present

## 2015-06-17 ENCOUNTER — Encounter (INDEPENDENT_AMBULATORY_CARE_PROVIDER_SITE_OTHER): Payer: Medicare Other | Admitting: Ophthalmology

## 2015-06-17 DIAGNOSIS — H43813 Vitreous degeneration, bilateral: Secondary | ICD-10-CM | POA: Diagnosis not present

## 2015-06-17 DIAGNOSIS — H353221 Exudative age-related macular degeneration, left eye, with active choroidal neovascularization: Secondary | ICD-10-CM | POA: Diagnosis not present

## 2015-06-17 DIAGNOSIS — I10 Essential (primary) hypertension: Secondary | ICD-10-CM

## 2015-06-17 DIAGNOSIS — H353112 Nonexudative age-related macular degeneration, right eye, intermediate dry stage: Secondary | ICD-10-CM

## 2015-06-17 DIAGNOSIS — E113291 Type 2 diabetes mellitus with mild nonproliferative diabetic retinopathy without macular edema, right eye: Secondary | ICD-10-CM

## 2015-06-17 DIAGNOSIS — E11311 Type 2 diabetes mellitus with unspecified diabetic retinopathy with macular edema: Secondary | ICD-10-CM

## 2015-06-17 DIAGNOSIS — E113212 Type 2 diabetes mellitus with mild nonproliferative diabetic retinopathy with macular edema, left eye: Secondary | ICD-10-CM

## 2015-06-17 DIAGNOSIS — H35033 Hypertensive retinopathy, bilateral: Secondary | ICD-10-CM

## 2015-06-23 ENCOUNTER — Other Ambulatory Visit: Payer: Self-pay | Admitting: Cardiothoracic Surgery

## 2015-06-23 DIAGNOSIS — C3490 Malignant neoplasm of unspecified part of unspecified bronchus or lung: Secondary | ICD-10-CM

## 2015-06-24 ENCOUNTER — Encounter: Payer: Self-pay | Admitting: Cardiothoracic Surgery

## 2015-06-24 ENCOUNTER — Ambulatory Visit (INDEPENDENT_AMBULATORY_CARE_PROVIDER_SITE_OTHER): Payer: Medicare Other | Admitting: Cardiothoracic Surgery

## 2015-06-24 ENCOUNTER — Ambulatory Visit
Admission: RE | Admit: 2015-06-24 | Discharge: 2015-06-24 | Disposition: A | Discharge status: Home / Self Care | Payer: Medicare Other | Source: Ambulatory Visit | Attending: Cardiothoracic Surgery | Admitting: Cardiothoracic Surgery

## 2015-06-24 VITALS — BP 155/67 | HR 78 | Resp 16 | Ht 71.0 in | Wt 170.0 lb

## 2015-06-24 DIAGNOSIS — Z09 Encounter for follow-up examination after completed treatment for conditions other than malignant neoplasm: Secondary | ICD-10-CM | POA: Diagnosis not present

## 2015-06-24 DIAGNOSIS — C3431 Malignant neoplasm of lower lobe, right bronchus or lung: Secondary | ICD-10-CM | POA: Diagnosis not present

## 2015-06-24 DIAGNOSIS — C3490 Malignant neoplasm of unspecified part of unspecified bronchus or lung: Secondary | ICD-10-CM

## 2015-06-24 NOTE — Progress Notes (Signed)
[address]       [place], [address]             [phone]      Timothee R Alsop Boscobel Medical Record #[id number] Date of Birth: [birth year]     Referring: Curt Bears, MD Primary Care: Myriam Jacobson, MD  Chief Complaint:   POST OP FOLLOW UP 01/26/2015 OPERATIVE REPORT PREOPERATIVE DIAGNOSIS: Right lower lobe lung mass. POSTOPERATIVE DIAGNOSIS: Non-small cell carcinoma of the lung, right lower lobe. PROCEDURES PERFORMED: Bronchoscopy, right video-assisted thoracoscopy with wedge resection of right lower lobe nodule, and lymph node sampling. SURGEON: Lanelle Bal, MD  Lung cancer [facility])   Staging form: Lung, AJCC 7th Edition     Pathologic stage from 01/27/2015: Stage IA (T1a, N0, cM0) - Signed by [name], MD    History of Present Illness:     Patient doing well postop, increasing activity , no sob. He has had  left inguinal hernia repaired since last seen     Past Medical History  Diagnosis Date  . Lung mass 12/01/2014  . Lung mass 12/01/2014  . Hypertension     takes Amlodipine daily  . Vertigo     takes Antivert daily as needed  . Diabetes mellitus     takes Metformin daily  . Hyperlipidemia     takes Crestor daily  . Overactive bladder     takes Myrbetriq daily  . Coronary artery disease     takes Plavix daily  . GERD (gastroesophageal reflux disease)     takes tagamet daily  . History of colon polyps     benign  . History of migraine     many yrs ago  . TIA (transient ischemic attack)   . Arthritis   . Joint swelling   . Back pain   . Urinary frequency   . Urinary urgency   . Prostate cancer ([place])   . Nocturia   . History of blood transfusion     no abnormal reaction noted  . Macular degeneration     wet and on the left;injection 01/20/15  . Stroke ([place])   . Headache     HX MIGRAINES     History  Smoking status  . Former Smoker -- 0.50 packs/day for 30 years  . Quit date:  06/05/1959  Smokeless tobacco  . Not on file    History  Alcohol Use No     Allergies  Allergen Reactions  . Ivp Dye [Iodinated Diagnostic Agents] Shortness Of Breath  . Toviaz [Fesoterodine Fumarate Er] Swelling    Mouth swelling    Current Outpatient Prescriptions  Medication Sig Dispense Refill  . amLODipine (NORVASC) 5 MG tablet Take 5 mg by mouth at bedtime.     Marland Kitchen aspirin EC 81 MG tablet Take 81 mg by mouth daily.    Marland Kitchen Besifloxacin HCl (BESIVANCE) 0.6 % SUSP Place 1 drop into the left eye See admin instructions. Instil 1 drop into left eye 4 times on the day of eye injection and 4 times on the next day (injections are monthly - last injection 01/20/15)    . Butalbital-APAP-Caffeine (FIORICET) 50-300-40 MG CAPS Take 1 tablet by mouth 2 (two) times daily as needed (migraines).     . cimetidine (TAGAMET) 400 MG tablet Take 400 mg by mouth 2 (two) times daily as needed (heartburn/ acid reflux).     . clopidogrel (PLAVIX) 75 MG tablet Take 75 mg by mouth  daily.     . meclizine (ANTIVERT) 25 MG tablet Take 25 mg by mouth daily as needed for dizziness.     . metFORMIN (GLUCOPHAGE) 1000 MG tablet Take 1,000 mg by mouth 2 (two) times daily with a meal.      . mirabegron ER (MYRBETRIQ) 50 MG TB24 tablet Take 50 mg by mouth daily.    . Multiple Vitamin (MULTIVITAMIN WITH MINERALS) TABS tablet Take 1 tablet by mouth at bedtime.    . rosuvastatin (CRESTOR) 10 MG tablet Take 5 mg by mouth 3 (three) times a week.     . triamcinolone cream (KENALOG) 0.1 % Apply 1 application topically daily as needed (rash on groin and/or legs).      No current facility-administered medications for this visit.       Physical Exam: There were no vitals taken for this visit.  General appearance: alert and cooperative Neurologic: intact Heart: regular rate and rhythm, S1, S2 normal, no murmur, click, rub or gallop Lungs: clear to auscultation bilaterally Abdomen: soft, non-tender; bowel sounds normal; no  masses,  no organomegaly Extremities: extremities normal, atraumatic, no cyanosis or edema Wound: wounds well healed Left inguinal hernia recent repair wound ok  Diagnostic Studies & Laboratory data:     Recent Radiology Findings:   Dg Chest 2 View  06/24/2015  CLINICAL DATA:  History of lung cancer.  History of VATS 01/26/2015 EXAM: CHEST  2 VIEW COMPARISON:  03/23/2015 FINDINGS: There is mild right lung volume loss. There is blunting of the right costophrenic angle concerning for small pleural effusion and/or scarring. There is no other focal parenchymal opacity. There is no focal parenchymal opacity. There is no pleural effusion or pneumothorax. The heart and mediastinal contours are stable. The osseous structures are unremarkable. IMPRESSION: No active cardiopulmonary disease. Mild right lung volume loss. Blunting of the right costophrenic angle concerning for small pleural effusion and/or scarring. Electronically Signed   By: Kathreen Devoid   On: 06/24/2015 10:29     Recent Lab Findings: Lab Results  Component Value Date   WBC 5.6 03/10/2015   HGB 8.9* 03/10/2015   HCT 28.6* 03/10/2015   PLT 365 03/10/2015   GLUCOSE 148* 03/10/2015   CHOL 145 04/23/2011   TRIG 77 04/23/2011   HDL 59 04/23/2011   LDLCALC 71 04/23/2011   ALT 13* 01/28/2015   AST 25 01/28/2015   NA 137 03/10/2015   K 4.2 03/10/2015   CL 100* 03/10/2015   CREATININE 0.82 03/10/2015   BUN 21* 03/10/2015   CO2 22 03/10/2015   INR 1.05 01/22/2015   HGBA1C 7.5* 03/10/2015      Assessment / Plan:   Return 21month with repeat chest xray, plan ct of chest 6 months post op Doing well post op   [name]      [address] [place] [address]Office [phone]   Beeper [phone] 06/24/2015 10:37 AM

## 2015-07-15 ENCOUNTER — Encounter (INDEPENDENT_AMBULATORY_CARE_PROVIDER_SITE_OTHER): Payer: Medicare Other | Admitting: Ophthalmology

## 2015-07-15 DIAGNOSIS — E113291 Type 2 diabetes mellitus with mild nonproliferative diabetic retinopathy without macular edema, right eye: Secondary | ICD-10-CM | POA: Diagnosis not present

## 2015-07-15 DIAGNOSIS — E113212 Type 2 diabetes mellitus with mild nonproliferative diabetic retinopathy with macular edema, left eye: Secondary | ICD-10-CM

## 2015-07-15 DIAGNOSIS — H353221 Exudative age-related macular degeneration, left eye, with active choroidal neovascularization: Secondary | ICD-10-CM | POA: Diagnosis not present

## 2015-07-15 DIAGNOSIS — E11311 Type 2 diabetes mellitus with unspecified diabetic retinopathy with macular edema: Secondary | ICD-10-CM | POA: Diagnosis not present

## 2015-07-15 DIAGNOSIS — H35033 Hypertensive retinopathy, bilateral: Secondary | ICD-10-CM | POA: Diagnosis not present

## 2015-07-15 DIAGNOSIS — I10 Essential (primary) hypertension: Secondary | ICD-10-CM | POA: Diagnosis not present

## 2015-07-15 DIAGNOSIS — H43813 Vitreous degeneration, bilateral: Secondary | ICD-10-CM

## 2015-07-15 DIAGNOSIS — H353112 Nonexudative age-related macular degeneration, right eye, intermediate dry stage: Secondary | ICD-10-CM

## 2015-08-12 ENCOUNTER — Encounter (INDEPENDENT_AMBULATORY_CARE_PROVIDER_SITE_OTHER): Payer: Medicare Other | Admitting: Ophthalmology

## 2015-08-12 DIAGNOSIS — H353113 Nonexudative age-related macular degeneration, right eye, advanced atrophic without subfoveal involvement: Secondary | ICD-10-CM

## 2015-08-12 DIAGNOSIS — E113312 Type 2 diabetes mellitus with moderate nonproliferative diabetic retinopathy with macular edema, left eye: Secondary | ICD-10-CM

## 2015-08-12 DIAGNOSIS — H35033 Hypertensive retinopathy, bilateral: Secondary | ICD-10-CM

## 2015-08-12 DIAGNOSIS — H353221 Exudative age-related macular degeneration, left eye, with active choroidal neovascularization: Secondary | ICD-10-CM | POA: Diagnosis not present

## 2015-08-12 DIAGNOSIS — H43813 Vitreous degeneration, bilateral: Secondary | ICD-10-CM | POA: Diagnosis not present

## 2015-08-12 DIAGNOSIS — E11311 Type 2 diabetes mellitus with unspecified diabetic retinopathy with macular edema: Secondary | ICD-10-CM | POA: Diagnosis not present

## 2015-08-12 DIAGNOSIS — I10 Essential (primary) hypertension: Secondary | ICD-10-CM

## 2015-09-03 ENCOUNTER — Other Ambulatory Visit: Payer: Self-pay | Admitting: *Deleted

## 2015-09-03 DIAGNOSIS — C3411 Malignant neoplasm of upper lobe, right bronchus or lung: Secondary | ICD-10-CM

## 2015-09-09 ENCOUNTER — Encounter (INDEPENDENT_AMBULATORY_CARE_PROVIDER_SITE_OTHER): Payer: Medicare Other | Admitting: Ophthalmology

## 2015-09-09 DIAGNOSIS — H43813 Vitreous degeneration, bilateral: Secondary | ICD-10-CM

## 2015-09-09 DIAGNOSIS — I10 Essential (primary) hypertension: Secondary | ICD-10-CM

## 2015-09-09 DIAGNOSIS — H35033 Hypertensive retinopathy, bilateral: Secondary | ICD-10-CM

## 2015-09-09 DIAGNOSIS — E11311 Type 2 diabetes mellitus with unspecified diabetic retinopathy with macular edema: Secondary | ICD-10-CM | POA: Diagnosis not present

## 2015-09-09 DIAGNOSIS — H353221 Exudative age-related macular degeneration, left eye, with active choroidal neovascularization: Secondary | ICD-10-CM | POA: Diagnosis not present

## 2015-09-09 DIAGNOSIS — E113212 Type 2 diabetes mellitus with mild nonproliferative diabetic retinopathy with macular edema, left eye: Secondary | ICD-10-CM | POA: Diagnosis not present

## 2015-09-09 DIAGNOSIS — E113291 Type 2 diabetes mellitus with mild nonproliferative diabetic retinopathy without macular edema, right eye: Secondary | ICD-10-CM

## 2015-09-09 DIAGNOSIS — H353112 Nonexudative age-related macular degeneration, right eye, intermediate dry stage: Secondary | ICD-10-CM | POA: Diagnosis not present

## 2015-10-07 ENCOUNTER — Encounter (INDEPENDENT_AMBULATORY_CARE_PROVIDER_SITE_OTHER): Payer: Medicare Other | Admitting: Ophthalmology

## 2015-10-07 ENCOUNTER — Encounter: Payer: Self-pay | Admitting: Cardiothoracic Surgery

## 2015-10-07 ENCOUNTER — Ambulatory Visit (INDEPENDENT_AMBULATORY_CARE_PROVIDER_SITE_OTHER): Payer: Medicare Other | Admitting: Cardiothoracic Surgery

## 2015-10-07 ENCOUNTER — Ambulatory Visit
Admission: RE | Admit: 2015-10-07 | Discharge: 2015-10-07 | Disposition: A | Discharge status: Home / Self Care | Payer: Medicare Other | Source: Ambulatory Visit | Attending: Cardiothoracic Surgery | Admitting: Cardiothoracic Surgery

## 2015-10-07 VITALS — BP 150/72 | HR 76 | Resp 20 | Ht 71.0 in | Wt 170.0 lb

## 2015-10-07 DIAGNOSIS — E113212 Type 2 diabetes mellitus with mild nonproliferative diabetic retinopathy with macular edema, left eye: Secondary | ICD-10-CM | POA: Diagnosis not present

## 2015-10-07 DIAGNOSIS — H353112 Nonexudative age-related macular degeneration, right eye, intermediate dry stage: Secondary | ICD-10-CM

## 2015-10-07 DIAGNOSIS — H353221 Exudative age-related macular degeneration, left eye, with active choroidal neovascularization: Secondary | ICD-10-CM | POA: Diagnosis not present

## 2015-10-07 DIAGNOSIS — C3411 Malignant neoplasm of upper lobe, right bronchus or lung: Secondary | ICD-10-CM

## 2015-10-07 DIAGNOSIS — H35033 Hypertensive retinopathy, bilateral: Secondary | ICD-10-CM | POA: Diagnosis not present

## 2015-10-07 DIAGNOSIS — E113291 Type 2 diabetes mellitus with mild nonproliferative diabetic retinopathy without macular edema, right eye: Secondary | ICD-10-CM

## 2015-10-07 DIAGNOSIS — C3431 Malignant neoplasm of lower lobe, right bronchus or lung: Secondary | ICD-10-CM

## 2015-10-07 DIAGNOSIS — Z09 Encounter for follow-up examination after completed treatment for conditions other than malignant neoplasm: Secondary | ICD-10-CM

## 2015-10-07 DIAGNOSIS — I10 Essential (primary) hypertension: Secondary | ICD-10-CM

## 2015-10-07 DIAGNOSIS — E11311 Type 2 diabetes mellitus with unspecified diabetic retinopathy with macular edema: Secondary | ICD-10-CM

## 2015-10-07 DIAGNOSIS — H43813 Vitreous degeneration, bilateral: Secondary | ICD-10-CM | POA: Diagnosis not present

## 2015-10-07 NOTE — Progress Notes (Signed)
[address]       [place] [address]             [phone]      [place] R [facility] [place] Medical Record #[id number] Date of Birth: 01-25-1925     Referring: Curt Bears, MD Primary Care: Myriam Jacobson, MD  Chief Complaint:   POST OP FOLLOW UP 01/26/2015 OPERATIVE REPORT PREOPERATIVE DIAGNOSIS: Right lower lobe lung mass. POSTOPERATIVE DIAGNOSIS: Non-small cell carcinoma of the lung, right lower lobe. PROCEDURES PERFORMED: Bronchoscopy, right video-assisted thoracoscopy with wedge resection of right lower lobe nodule, and lymph node sampling. SURGEON: Lanelle Bal, MD  Lung cancer [facility])   Staging form: Lung, AJCC 7th Edition     Pathologic stage from 01/27/2015: Stage IA (T1a, N0, cM0) - Signed by [name], MD    History of Present Illness:     Patient returns office now 6 months following wedge resection of non-small cell stage I a carcinoma the lung from the right lower lobe with lymph node dissection. The patient made good progress postoperatively. His overall functional status is not changed following surgery. He remains active, notes that he and his wife have been caring for various family members who have been ill. They note a 50th wedding anniversary coming up this summer.     Past Medical History  Diagnosis Date  . Lung mass 12/01/2014  . Lung mass 12/01/2014  . Hypertension     takes Amlodipine daily  . Vertigo     takes Antivert daily as needed  . Diabetes mellitus     takes Metformin daily  . Hyperlipidemia     takes Crestor daily  . Overactive bladder     takes Myrbetriq daily  . Coronary artery disease     takes Plavix daily  . GERD (gastroesophageal reflux disease)     takes tagamet daily  . History of colon polyps     benign  . History of migraine     many yrs ago  . TIA (transient ischemic attack)   . Arthritis   . Joint swelling   . Back pain   . Urinary frequency   . Urinary  urgency   . Prostate cancer ([place])   . Nocturia   . History of blood transfusion     no abnormal reaction noted  . Macular degeneration     wet and on the left;injection 01/20/15  . Stroke ([place])   . Headache     HX MIGRAINES     History  Smoking status  . Former Smoker -- 0.50 packs/day for 30 years  . Quit date: 06/05/1959  Smokeless tobacco  . Not on file    History  Alcohol Use No     Allergies  Allergen Reactions  . Ivp Dye [Iodinated Diagnostic Agents] Shortness Of Breath  . Toviaz [Fesoterodine Fumarate Er] Swelling    Mouth swelling    Current Outpatient Prescriptions  Medication Sig Dispense Refill  . amLODipine (NORVASC) 5 MG tablet Take 5 mg by mouth at bedtime.     Marland Kitchen aspirin EC 81 MG tablet Take 81 mg by mouth daily.    Marland Kitchen Besifloxacin HCl (BESIVANCE) 0.6 % SUSP Place 1 drop into the left eye See admin instructions. Instil 1 drop into left eye 4 times on the day of eye injection and 4 times on the next day (injections are monthly - last injection 06/17/15    . Butalbital-APAP-Caffeine (FIORICET) 50-300-40 MG  CAPS Take 1 tablet by mouth 2 (two) times daily as needed (migraines).     . cimetidine (TAGAMET) 400 MG tablet Take 400 mg by mouth 2 (two) times daily as needed (heartburn/ acid reflux).     . clopidogrel (PLAVIX) 75 MG tablet Take 75 mg by mouth daily.     . meclizine (ANTIVERT) 25 MG tablet Take 25 mg by mouth daily as needed for dizziness.     . metFORMIN (GLUCOPHAGE) 1000 MG tablet Take 1,000 mg by mouth 2 (two) times daily with a meal.      . mirabegron ER (MYRBETRIQ) 50 MG TB24 tablet Take 50 mg by mouth daily.    . Multiple Vitamin (MULTIVITAMIN WITH MINERALS) TABS tablet Take 1 tablet by mouth at bedtime.    . rosuvastatin (CRESTOR) 10 MG tablet Take 5 mg by mouth 3 (three) times a week.     . triamcinolone cream (KENALOG) 0.1 % Apply 1 application topically daily as needed (rash on groin and/or legs).      No current facility-administered medications  for this visit.       Physical Exam: BP 150/72 mmHg  Pulse 76  Resp 20  Ht '5\' 11"'$  (1.803 m)  Wt 170 lb (77.111 kg)  BMI 23.72 kg/m2  SpO2 99%  General appearance: alert and cooperative Neurologic: intact Heart: regular rate and rhythm, S1, S2 normal, no murmur, click, rub or gallop Lungs: clear to auscultation bilaterally Abdomen: soft, non-tender; bowel sounds normal; no masses,  no organomegaly Extremities: extremities normal, atraumatic, no cyanosis or edema Wound: Right chest incision well healed No palpable cervical or supraclavicular adenopathy   Diagnostic Studies & Laboratory data:     Recent Radiology Findings:   Ct Chest Wo Contrast  10/07/2015  CLINICAL DATA:  History of right lung cancer. EXAM: CT CHEST WITHOUT CONTRAST TECHNIQUE: Multidetector CT imaging of the chest was performed following the standard protocol without IV contrast. COMPARISON:  10/30/2014 FINDINGS: Mediastinum/Lymph Nodes: The heart size appears normal. There is aortic atherosclerosis noted. Calcification involving the LAD, RCA and left circumflex coronary artery noted. There is no mediastinal or hilar adenopathy identified. The trachea appears patent and is midline. Normal appearance of the esophagus. Lungs/Pleura: Status post wedge resection of right lower lobe lung cancer. Postoperative change with associated scarring, architectural distortion and pleural thickening noted. A few small scattered nodules are identified in both lungs. Allowing for differences in technique the appearance of these nodules are not significantly changed when compared with previous exam. The previous exam was acquired at 5 mm slice thickness. The current exam was acquired at 2.5 mm slice thickness. Upper abdomen: The adrenal glands are unremarkable. Gallstones are identified. The visualized portions of the liver and spleen appear normal. Unremarkable appearance of the adrenal glands. Musculoskeletal: There are no aggressive lytic  or sclerotic bone lesions identified. The bones appear diffusely osteopenic. There is a compression deformity involving the T12 vertebra which is fused with the T11 vertebra. The appearance is unchanged from previous exam. IMPRESSION: 1. Postoperative appearance of the right lower lobe status post wedge resection of neoplasm. No specific findings identified to suggest local tumor recurrence. 2. Allowing for differences in technique between the current exam and study from 10/30/2014 there has been no significant change in small nonspecific pulmonary nodules. Attention on follow-up imaging is recommended. 3. Aortic atherosclerosis and multi vessel coronary artery calcification. Electronically Signed   By: Kerby Moors M.D.   On: 10/07/2015 10:16    I have independently  reviewed the above  cath films and reviewed the findings with the  patient .   Recent Lab Findings: Lab Results  Component Value Date   WBC 5.6 03/10/2015   HGB 8.9* 03/10/2015   HCT 28.6* 03/10/2015   PLT 365 03/10/2015   GLUCOSE 148* 03/10/2015   CHOL 145 04/23/2011   TRIG 77 04/23/2011   HDL 59 04/23/2011   LDLCALC 71 04/23/2011   ALT 13* 01/28/2015   AST 25 01/28/2015   NA 137 03/10/2015   K 4.2 03/10/2015   CL 100* 03/10/2015   CREATININE 0.82 03/10/2015   BUN 21* 03/10/2015   CO2 22 03/10/2015   INR 1.05 01/22/2015   HGBA1C 7.5* 03/10/2015      Assessment / Plan:   Patient doing well now 6 months following wedge resection for a stage IA carcinoma the lung without evidence of recurrence. We'll plan continued serial follow-up with a repeat CT scan of the chest in 6 months.    [name] MD      [address] [place] [address] Office [phone]   Beeper [phone]  10/07/2015 10:39 AM

## 2015-11-04 ENCOUNTER — Encounter (INDEPENDENT_AMBULATORY_CARE_PROVIDER_SITE_OTHER): Payer: Medicare Other | Admitting: Ophthalmology

## 2015-11-04 DIAGNOSIS — E113212 Type 2 diabetes mellitus with mild nonproliferative diabetic retinopathy with macular edema, left eye: Secondary | ICD-10-CM | POA: Diagnosis not present

## 2015-11-04 DIAGNOSIS — I10 Essential (primary) hypertension: Secondary | ICD-10-CM

## 2015-11-04 DIAGNOSIS — H353221 Exudative age-related macular degeneration, left eye, with active choroidal neovascularization: Secondary | ICD-10-CM | POA: Diagnosis not present

## 2015-11-04 DIAGNOSIS — H353112 Nonexudative age-related macular degeneration, right eye, intermediate dry stage: Secondary | ICD-10-CM

## 2015-11-04 DIAGNOSIS — E11311 Type 2 diabetes mellitus with unspecified diabetic retinopathy with macular edema: Secondary | ICD-10-CM | POA: Diagnosis not present

## 2015-11-04 DIAGNOSIS — H43813 Vitreous degeneration, bilateral: Secondary | ICD-10-CM

## 2015-11-04 DIAGNOSIS — H35033 Hypertensive retinopathy, bilateral: Secondary | ICD-10-CM

## 2015-11-04 DIAGNOSIS — E113291 Type 2 diabetes mellitus with mild nonproliferative diabetic retinopathy without macular edema, right eye: Secondary | ICD-10-CM

## 2015-12-02 ENCOUNTER — Encounter (INDEPENDENT_AMBULATORY_CARE_PROVIDER_SITE_OTHER): Payer: Medicare Other | Admitting: Ophthalmology

## 2015-12-02 DIAGNOSIS — E11311 Type 2 diabetes mellitus with unspecified diabetic retinopathy with macular edema: Secondary | ICD-10-CM | POA: Diagnosis not present

## 2015-12-02 DIAGNOSIS — H353112 Nonexudative age-related macular degeneration, right eye, intermediate dry stage: Secondary | ICD-10-CM

## 2015-12-02 DIAGNOSIS — H353221 Exudative age-related macular degeneration, left eye, with active choroidal neovascularization: Secondary | ICD-10-CM | POA: Diagnosis not present

## 2015-12-02 DIAGNOSIS — E113212 Type 2 diabetes mellitus with mild nonproliferative diabetic retinopathy with macular edema, left eye: Secondary | ICD-10-CM | POA: Diagnosis not present

## 2015-12-02 DIAGNOSIS — E113291 Type 2 diabetes mellitus with mild nonproliferative diabetic retinopathy without macular edema, right eye: Secondary | ICD-10-CM

## 2015-12-02 DIAGNOSIS — I10 Essential (primary) hypertension: Secondary | ICD-10-CM

## 2015-12-02 DIAGNOSIS — H35033 Hypertensive retinopathy, bilateral: Secondary | ICD-10-CM | POA: Diagnosis not present

## 2015-12-02 DIAGNOSIS — H43813 Vitreous degeneration, bilateral: Secondary | ICD-10-CM | POA: Diagnosis not present

## 2015-12-30 ENCOUNTER — Encounter (INDEPENDENT_AMBULATORY_CARE_PROVIDER_SITE_OTHER): Payer: Medicare Other | Admitting: Ophthalmology

## 2015-12-30 DIAGNOSIS — H353221 Exudative age-related macular degeneration, left eye, with active choroidal neovascularization: Secondary | ICD-10-CM

## 2015-12-30 DIAGNOSIS — E11311 Type 2 diabetes mellitus with unspecified diabetic retinopathy with macular edema: Secondary | ICD-10-CM

## 2015-12-30 DIAGNOSIS — I10 Essential (primary) hypertension: Secondary | ICD-10-CM | POA: Diagnosis not present

## 2015-12-30 DIAGNOSIS — E113212 Type 2 diabetes mellitus with mild nonproliferative diabetic retinopathy with macular edema, left eye: Secondary | ICD-10-CM

## 2015-12-30 DIAGNOSIS — H35033 Hypertensive retinopathy, bilateral: Secondary | ICD-10-CM | POA: Diagnosis not present

## 2015-12-30 DIAGNOSIS — E113291 Type 2 diabetes mellitus with mild nonproliferative diabetic retinopathy without macular edema, right eye: Secondary | ICD-10-CM | POA: Diagnosis not present

## 2015-12-30 DIAGNOSIS — H43813 Vitreous degeneration, bilateral: Secondary | ICD-10-CM | POA: Diagnosis not present

## 2015-12-30 DIAGNOSIS — H353112 Nonexudative age-related macular degeneration, right eye, intermediate dry stage: Secondary | ICD-10-CM

## 2016-01-27 ENCOUNTER — Encounter (INDEPENDENT_AMBULATORY_CARE_PROVIDER_SITE_OTHER): Payer: Medicare Other | Admitting: Ophthalmology

## 2016-01-27 DIAGNOSIS — H353112 Nonexudative age-related macular degeneration, right eye, intermediate dry stage: Secondary | ICD-10-CM

## 2016-01-27 DIAGNOSIS — I10 Essential (primary) hypertension: Secondary | ICD-10-CM | POA: Diagnosis not present

## 2016-01-27 DIAGNOSIS — H35033 Hypertensive retinopathy, bilateral: Secondary | ICD-10-CM

## 2016-01-27 DIAGNOSIS — H353221 Exudative age-related macular degeneration, left eye, with active choroidal neovascularization: Secondary | ICD-10-CM

## 2016-01-27 DIAGNOSIS — E113391 Type 2 diabetes mellitus with moderate nonproliferative diabetic retinopathy without macular edema, right eye: Secondary | ICD-10-CM

## 2016-01-27 DIAGNOSIS — E113312 Type 2 diabetes mellitus with moderate nonproliferative diabetic retinopathy with macular edema, left eye: Secondary | ICD-10-CM | POA: Diagnosis not present

## 2016-01-27 DIAGNOSIS — E11311 Type 2 diabetes mellitus with unspecified diabetic retinopathy with macular edema: Secondary | ICD-10-CM | POA: Diagnosis not present

## 2016-01-27 DIAGNOSIS — H43813 Vitreous degeneration, bilateral: Secondary | ICD-10-CM

## 2016-02-24 ENCOUNTER — Encounter (INDEPENDENT_AMBULATORY_CARE_PROVIDER_SITE_OTHER): Payer: Medicare Other | Admitting: Ophthalmology

## 2016-02-24 DIAGNOSIS — E113391 Type 2 diabetes mellitus with moderate nonproliferative diabetic retinopathy without macular edema, right eye: Secondary | ICD-10-CM | POA: Diagnosis not present

## 2016-02-24 DIAGNOSIS — H35033 Hypertensive retinopathy, bilateral: Secondary | ICD-10-CM | POA: Diagnosis not present

## 2016-02-24 DIAGNOSIS — H43813 Vitreous degeneration, bilateral: Secondary | ICD-10-CM | POA: Diagnosis not present

## 2016-02-24 DIAGNOSIS — H353221 Exudative age-related macular degeneration, left eye, with active choroidal neovascularization: Secondary | ICD-10-CM | POA: Diagnosis not present

## 2016-02-24 DIAGNOSIS — H353112 Nonexudative age-related macular degeneration, right eye, intermediate dry stage: Secondary | ICD-10-CM

## 2016-02-24 DIAGNOSIS — I10 Essential (primary) hypertension: Secondary | ICD-10-CM

## 2016-02-24 DIAGNOSIS — E113312 Type 2 diabetes mellitus with moderate nonproliferative diabetic retinopathy with macular edema, left eye: Secondary | ICD-10-CM | POA: Diagnosis not present

## 2016-02-24 DIAGNOSIS — E11311 Type 2 diabetes mellitus with unspecified diabetic retinopathy with macular edema: Secondary | ICD-10-CM | POA: Diagnosis not present

## 2016-02-29 ENCOUNTER — Other Ambulatory Visit: Payer: Self-pay | Admitting: Cardiology

## 2016-02-29 ENCOUNTER — Other Ambulatory Visit (HOSPITAL_COMMUNITY): Payer: Self-pay | Admitting: Internal Medicine

## 2016-02-29 DIAGNOSIS — I6523 Occlusion and stenosis of bilateral carotid arteries: Secondary | ICD-10-CM

## 2016-03-07 ENCOUNTER — Other Ambulatory Visit: Payer: Self-pay | Admitting: Cardiothoracic Surgery

## 2016-03-07 DIAGNOSIS — C3490 Malignant neoplasm of unspecified part of unspecified bronchus or lung: Secondary | ICD-10-CM

## 2016-03-28 ENCOUNTER — Ambulatory Visit (HOSPITAL_COMMUNITY)
Admission: RE | Admit: 2016-03-28 | Discharge: 2016-03-28 | Disposition: A | Discharge status: Home / Self Care | Payer: Medicare Other | Source: Ambulatory Visit | Attending: Cardiovascular Disease | Admitting: Cardiovascular Disease

## 2016-03-28 DIAGNOSIS — I6523 Occlusion and stenosis of bilateral carotid arteries: Secondary | ICD-10-CM | POA: Diagnosis present

## 2016-03-30 ENCOUNTER — Ambulatory Visit
Admission: RE | Admit: 2016-03-30 | Discharge: 2016-03-30 | Disposition: A | Discharge status: Home / Self Care | Payer: Medicare Other | Source: Ambulatory Visit | Attending: Cardiothoracic Surgery | Admitting: Cardiothoracic Surgery

## 2016-03-30 ENCOUNTER — Ambulatory Visit (INDEPENDENT_AMBULATORY_CARE_PROVIDER_SITE_OTHER): Payer: Medicare Other | Admitting: Cardiothoracic Surgery

## 2016-03-30 ENCOUNTER — Encounter (INDEPENDENT_AMBULATORY_CARE_PROVIDER_SITE_OTHER): Payer: Medicare Other | Admitting: Ophthalmology

## 2016-03-30 ENCOUNTER — Encounter: Payer: Self-pay | Admitting: Cardiothoracic Surgery

## 2016-03-30 VITALS — BP 188/83 | HR 73 | Resp 16 | Ht 71.0 in | Wt 165.0 lb

## 2016-03-30 DIAGNOSIS — H353112 Nonexudative age-related macular degeneration, right eye, intermediate dry stage: Secondary | ICD-10-CM | POA: Diagnosis not present

## 2016-03-30 DIAGNOSIS — E11311 Type 2 diabetes mellitus with unspecified diabetic retinopathy with macular edema: Secondary | ICD-10-CM | POA: Diagnosis not present

## 2016-03-30 DIAGNOSIS — I10 Essential (primary) hypertension: Secondary | ICD-10-CM

## 2016-03-30 DIAGNOSIS — H35033 Hypertensive retinopathy, bilateral: Secondary | ICD-10-CM

## 2016-03-30 DIAGNOSIS — Z09 Encounter for follow-up examination after completed treatment for conditions other than malignant neoplasm: Secondary | ICD-10-CM

## 2016-03-30 DIAGNOSIS — E113391 Type 2 diabetes mellitus with moderate nonproliferative diabetic retinopathy without macular edema, right eye: Secondary | ICD-10-CM | POA: Diagnosis not present

## 2016-03-30 DIAGNOSIS — H43813 Vitreous degeneration, bilateral: Secondary | ICD-10-CM | POA: Diagnosis not present

## 2016-03-30 DIAGNOSIS — H353221 Exudative age-related macular degeneration, left eye, with active choroidal neovascularization: Secondary | ICD-10-CM | POA: Diagnosis not present

## 2016-03-30 DIAGNOSIS — C3431 Malignant neoplasm of lower lobe, right bronchus or lung: Secondary | ICD-10-CM

## 2016-03-30 DIAGNOSIS — I6523 Occlusion and stenosis of bilateral carotid arteries: Secondary | ICD-10-CM

## 2016-03-30 DIAGNOSIS — E113312 Type 2 diabetes mellitus with moderate nonproliferative diabetic retinopathy with macular edema, left eye: Secondary | ICD-10-CM

## 2016-03-30 DIAGNOSIS — C3490 Malignant neoplasm of unspecified part of unspecified bronchus or lung: Secondary | ICD-10-CM

## 2016-03-30 DIAGNOSIS — Z85118 Personal history of other malignant neoplasm of bronchus and lung: Secondary | ICD-10-CM

## 2016-03-30 NOTE — Progress Notes (Signed)
[address]       [place] [address]             [phone]      Garrett [place] Medical Record #[id number] Date of Birth: 07-31-1924     Referring: Curt Bears, MD Primary Care: Myriam Jacobson, MD  Chief Complaint:   POST OP FOLLOW UP 01/26/2015 OPERATIVE REPORT PREOPERATIVE DIAGNOSIS: Right lower lobe lung mass. POSTOPERATIVE DIAGNOSIS: Non-small cell carcinoma of the lung, right lower lobe. PROCEDURES PERFORMED: Bronchoscopy, right video-assisted thoracoscopy with wedge resection of right lower lobe nodule, and lymph node sampling. SURGEON: Lanelle Bal, MD  Lung cancer [facility])   Staging form: Lung, AJCC 7th Edition     Pathologic stage from 01/27/2015: Stage IA (T1a, N0, cM0) - Signed by [name], MD    History of Present Illness:     Patient returns office now 6 months following wedge resection of non-small cell stage I a carcinoma the lung from the right lower lobe with lymph node dissection. The patient made good progress postoperatively. His overall functional status is not changed following surgery. He remains active, notes that he and his wife have been caring for various family members who have been ill.  He and his wife had 50th wedding anniversary several months ago.      Past Medical History:  Diagnosis Date  . Arthritis   . Back pain   . Coronary artery disease    takes Plavix daily  . Diabetes mellitus    takes Metformin daily  . GERD (gastroesophageal reflux disease)    takes tagamet daily  . Headache    HX MIGRAINES  . History of blood transfusion    no abnormal reaction noted  . History of colon polyps    benign  . History of migraine    many yrs ago  . Hyperlipidemia    takes Crestor daily  . Hypertension    takes Amlodipine daily  . Joint swelling   . Lung mass 12/01/2014  . Lung mass 12/01/2014  . Macular degeneration    wet and on the left;injection 01/20/15  . Nocturia    . Overactive bladder    takes Myrbetriq daily  . Prostate cancer ([place])   . Stroke ([place])   . TIA (transient ischemic attack)   . Urinary frequency   . Urinary urgency   . Vertigo    takes Antivert daily as needed     History  Smoking Status  . Former Smoker  . Packs/day: 0.50  . Years: 30.00  . Quit date: 06/05/1959  Smokeless Tobacco  . Not on file    History  Alcohol Use No     Allergies  Allergen Reactions  . Ivp Dye [Iodinated Diagnostic Agents] Shortness Of Breath  . Toviaz [Fesoterodine Fumarate Er] Swelling    Mouth swelling    Current Outpatient Prescriptions  Medication Sig Dispense Refill  . aspirin EC 81 MG tablet Take 81 mg by mouth daily.    Marland Kitchen Besifloxacin HCl (BESIVANCE) 0.6 % SUSP Place 1 drop into the left eye See admin instructions. Instil 1 drop into left eye 4 times on the day of eye injection and 4 times on the next day (injections are monthly - last injection 06/17/15    . Butalbital-APAP-Caffeine (FIORICET) 50-300-40 MG CAPS Take 1 tablet by mouth 2 (two) times daily as needed (migraines).     . cimetidine (TAGAMET) 400 MG tablet Take  400 mg by mouth 2 (two) times daily as needed (heartburn/ acid reflux).     . clopidogrel (PLAVIX) 75 MG tablet Take 75 mg by mouth daily.     Marland Kitchen lisinopril (PRINIVIL,ZESTRIL) 5 MG tablet Take 5 mg by mouth daily at 10 pm.    . meclizine (ANTIVERT) 25 MG tablet Take 25 mg by mouth daily as needed for dizziness.     . metFORMIN (GLUCOPHAGE) 1000 MG tablet Take 1,000 mg by mouth 2 (two) times daily with a meal.      . mirabegron ER (MYRBETRIQ) 50 MG TB24 tablet Take 50 mg by mouth daily.    . Multiple Vitamin (MULTIVITAMIN WITH MINERALS) TABS tablet Take 1 tablet by mouth at bedtime.    . rosuvastatin (CRESTOR) 10 MG tablet Take 5 mg by mouth 3 (three) times a week.     . triamcinolone cream (KENALOG) 0.1 % Apply 1 application topically daily as needed (rash on groin and/or legs).      No current facility-administered  medications for this visit.        Physical Exam: BP (!) 188/83 (BP Location: Left Arm, Patient Position: Sitting, Cuff Size: Normal) Comment: JUST HAD AN EYE INJECTION  Pulse 73   Resp 16   Ht '5\' 11"'$  (1.803 m)   Wt 165 lb (74.8 kg)   SpO2 94% Comment: RA  BMI 23.01 kg/m   General appearance: alert and cooperative Neurologic: intact Heart: regular rate and rhythm, S1, S2 normal, no murmur, click, rub or gallop Lungs: clear to auscultation bilaterally Abdomen: soft, non-tender; bowel sounds normal; no masses,  no organomegaly Extremities: extremities normal, atraumatic, no cyanosis or edema Wound: Right chest incision well healed No palpable cervical or supraclavicular adenopathy   Diagnostic Studies & Laboratory data:     Recent Radiology Findings:  Ct Chest Wo Contrast  Result Date: 03/30/2016 CLINICAL DATA:  82 year old male status post wedge resection right lower lobe 01/26/2015 demonstrating invasive well differentiated adenocarcinoma, presenting for restaging. EXAM: CT CHEST WITHOUT CONTRAST TECHNIQUE: Multidetector CT imaging of the chest was performed following the standard protocol without IV contrast. COMPARISON:  10/07/2015 chest CT. FINDINGS: Cardiovascular: Stable top-normal heart size. No significant pericardial fluid/thickening. Left main, left anterior descending, left circumflex and right coronary atherosclerosis. Atherosclerotic nonaneurysmal thoracic aorta. Normal caliber pulmonary arteries. Mediastinum/Nodes: Probable left hemithyroidectomy. No discrete right thyroid lobe nodules. Unremarkable esophagus. No pathologically enlarged axillary, mediastinal or gross hilar lymph nodes, noting limited sensitivity for the detection of hilar adenopathy on this noncontrast study. Lungs/Pleura: No pneumothorax. Mild diffuse right pleural thickening/trace right pleural effusion, slightly increased particularly in the apical right pleural space. No left pleural effusion. Status  post superior segment right lower lobe wedge resection. Stable parenchymal band along the suture line consistent with postsurgical scarring. There is a bandlike subpleural opacity in the peripheral basilar right upper lobe and lateral segment right middle lobe adjacent to mild pleural thickening measuring up to the 12 mm thickness (series 4/ image 74), increased from 8 mm thickness on 10/07/2015, with associated surrounding curvilinear parenchymal bands and mild distortion, favoring a evolving rounded atelectasis. Peripheral apical left upper lobe 3 mm solid pulmonary nodule (series 4/image 18) is stable since 10/30/2014 and considered benign. Lingular 4 mm solid pulmonary nodule (series 4/image 70) is stable back to 10/30/2014 and considered benign. Left lower lobe 3 mm solid pulmonary nodule (series 4/ image 55) is stable back to 10/30/2014 and considered benign. Otherwise no acute consolidative airspace disease or new significant pulmonary  nodules. Upper abdomen: Cholelithiasis. Musculoskeletal: No aggressive appearing focal osseous lesions. Stable chronic moderate T12 vertebral compression fracture. Marked thoracic spondylosis. IMPRESSION: 1. Increased thickness of bandlike subpleural opacity in the anterior peripheral right mid lung with mild diffuse right pleural thickening. The morphology and associated mild distortion and surrounding parenchymal banding are most suggestive of evolving rounded atelectasis. Continued chest CT surveillance is warranted in 3-6 months. 2. Otherwise no noncontrast CT findings of metastatic disease in the chest. 3. Aortic atherosclerosis. Left main and 3 vessel coronary atherosclerosis. 4. Cholelithiasis. Electronically Signed   By: Ilona Sorrel M.D.   On: 03/30/2016 13:15    Ct Chest Wo Contrast  10/07/2015  CLINICAL DATA:  History of right lung cancer. EXAM: CT CHEST WITHOUT CONTRAST TECHNIQUE: Multidetector CT imaging of the chest was performed following the standard protocol  without IV contrast. COMPARISON:  10/30/2014 FINDINGS: Mediastinum/Lymph Nodes: The heart size appears normal. There is aortic atherosclerosis noted. Calcification involving the LAD, RCA and left circumflex coronary artery noted. There is no mediastinal or hilar adenopathy identified. The trachea appears patent and is midline. Normal appearance of the esophagus. Lungs/Pleura: Status post wedge resection of right lower lobe lung cancer. Postoperative change with associated scarring, architectural distortion and pleural thickening noted. A few small scattered nodules are identified in both lungs. Allowing for differences in technique the appearance of these nodules are not significantly changed when compared with previous exam. The previous exam was acquired at 5 mm slice thickness. The current exam was acquired at 2.5 mm slice thickness. Upper abdomen: The adrenal glands are unremarkable. Gallstones are identified. The visualized portions of the liver and spleen appear normal. Unremarkable appearance of the adrenal glands. Musculoskeletal: There are no aggressive lytic or sclerotic bone lesions identified. The bones appear diffusely osteopenic. There is a compression deformity involving the T12 vertebra which is fused with the T11 vertebra. The appearance is unchanged from previous exam. IMPRESSION: 1. Postoperative appearance of the right lower lobe status post wedge resection of neoplasm. No specific findings identified to suggest local tumor recurrence. 2. Allowing for differences in technique between the current exam and study from 10/30/2014 there has been no significant change in small nonspecific pulmonary nodules. Attention on follow-up imaging is recommended. 3. Aortic atherosclerosis and multi vessel coronary artery calcification. Electronically Signed   By: Kerby Moors M.D.   On: 10/07/2015 10:16    I have independently reviewed the above  cath films and reviewed the findings with the  patient  .   Recent Lab Findings: Lab Results  Component Value Date   WBC 5.6 03/10/2015   HGB 8.9 (L) 03/10/2015   HCT 28.6 (L) 03/10/2015   PLT 365 03/10/2015   GLUCOSE 148 (H) 03/10/2015   CHOL 145 04/23/2011   TRIG 77 04/23/2011   HDL 59 04/23/2011   LDLCALC 71 04/23/2011   ALT 13 (L) 01/28/2015   AST 25 01/28/2015   NA 137 03/10/2015   K 4.2 03/10/2015   CL 100 (L) 03/10/2015   CREATININE 0.82 03/10/2015   BUN 21 (H) 03/10/2015   CO2 22 03/10/2015   INR 1.05 01/22/2015   HGBA1C 7.5 (H) 03/10/2015      Assessment / Plan:   Patient doing well now 13 months following wedge resection for a stage IA carcinoma the lung without evidence of recurrence. We'll plan continued serial follow-up with a repeat CT scan of the chest in 6 months.    [name] MD  [address] [place] [address] Office [phone]   Beeper [phone]  03/30/2016 2:31 PM

## 2016-04-27 ENCOUNTER — Encounter (INDEPENDENT_AMBULATORY_CARE_PROVIDER_SITE_OTHER): Payer: Medicare Other | Admitting: Ophthalmology

## 2016-04-27 DIAGNOSIS — E11311 Type 2 diabetes mellitus with unspecified diabetic retinopathy with macular edema: Secondary | ICD-10-CM | POA: Diagnosis not present

## 2016-04-27 DIAGNOSIS — H353112 Nonexudative age-related macular degeneration, right eye, intermediate dry stage: Secondary | ICD-10-CM

## 2016-04-27 DIAGNOSIS — H43813 Vitreous degeneration, bilateral: Secondary | ICD-10-CM | POA: Diagnosis not present

## 2016-04-27 DIAGNOSIS — H353221 Exudative age-related macular degeneration, left eye, with active choroidal neovascularization: Secondary | ICD-10-CM | POA: Diagnosis not present

## 2016-04-27 DIAGNOSIS — I10 Essential (primary) hypertension: Secondary | ICD-10-CM

## 2016-04-27 DIAGNOSIS — E113212 Type 2 diabetes mellitus with mild nonproliferative diabetic retinopathy with macular edema, left eye: Secondary | ICD-10-CM | POA: Diagnosis not present

## 2016-04-27 DIAGNOSIS — H35033 Hypertensive retinopathy, bilateral: Secondary | ICD-10-CM | POA: Diagnosis not present

## 2016-04-27 DIAGNOSIS — E113291 Type 2 diabetes mellitus with mild nonproliferative diabetic retinopathy without macular edema, right eye: Secondary | ICD-10-CM | POA: Diagnosis not present

## 2016-05-25 ENCOUNTER — Encounter (INDEPENDENT_AMBULATORY_CARE_PROVIDER_SITE_OTHER): Payer: Medicare Other | Admitting: Ophthalmology

## 2016-05-25 DIAGNOSIS — H353221 Exudative age-related macular degeneration, left eye, with active choroidal neovascularization: Secondary | ICD-10-CM

## 2016-05-25 DIAGNOSIS — E11311 Type 2 diabetes mellitus with unspecified diabetic retinopathy with macular edema: Secondary | ICD-10-CM | POA: Diagnosis not present

## 2016-05-25 DIAGNOSIS — H43813 Vitreous degeneration, bilateral: Secondary | ICD-10-CM | POA: Diagnosis not present

## 2016-05-25 DIAGNOSIS — I10 Essential (primary) hypertension: Secondary | ICD-10-CM

## 2016-05-25 DIAGNOSIS — E113212 Type 2 diabetes mellitus with mild nonproliferative diabetic retinopathy with macular edema, left eye: Secondary | ICD-10-CM | POA: Diagnosis not present

## 2016-05-25 DIAGNOSIS — E113291 Type 2 diabetes mellitus with mild nonproliferative diabetic retinopathy without macular edema, right eye: Secondary | ICD-10-CM

## 2016-05-25 DIAGNOSIS — H35033 Hypertensive retinopathy, bilateral: Secondary | ICD-10-CM | POA: Diagnosis not present

## 2016-05-25 DIAGNOSIS — H353112 Nonexudative age-related macular degeneration, right eye, intermediate dry stage: Secondary | ICD-10-CM

## 2016-06-22 ENCOUNTER — Encounter (INDEPENDENT_AMBULATORY_CARE_PROVIDER_SITE_OTHER): Payer: Medicare Other | Admitting: Ophthalmology

## 2016-06-22 DIAGNOSIS — H35033 Hypertensive retinopathy, bilateral: Secondary | ICD-10-CM | POA: Diagnosis not present

## 2016-06-22 DIAGNOSIS — H353111 Nonexudative age-related macular degeneration, right eye, early dry stage: Secondary | ICD-10-CM | POA: Diagnosis not present

## 2016-06-22 DIAGNOSIS — H353221 Exudative age-related macular degeneration, left eye, with active choroidal neovascularization: Secondary | ICD-10-CM | POA: Diagnosis not present

## 2016-06-22 DIAGNOSIS — E113293 Type 2 diabetes mellitus with mild nonproliferative diabetic retinopathy without macular edema, bilateral: Secondary | ICD-10-CM

## 2016-06-22 DIAGNOSIS — I10 Essential (primary) hypertension: Secondary | ICD-10-CM | POA: Diagnosis not present

## 2016-06-22 DIAGNOSIS — E11319 Type 2 diabetes mellitus with unspecified diabetic retinopathy without macular edema: Secondary | ICD-10-CM

## 2016-06-22 DIAGNOSIS — H43813 Vitreous degeneration, bilateral: Secondary | ICD-10-CM

## 2016-07-20 ENCOUNTER — Encounter (INDEPENDENT_AMBULATORY_CARE_PROVIDER_SITE_OTHER): Payer: Medicare Other | Admitting: Ophthalmology

## 2016-07-20 DIAGNOSIS — H35033 Hypertensive retinopathy, bilateral: Secondary | ICD-10-CM | POA: Diagnosis not present

## 2016-07-20 DIAGNOSIS — H353112 Nonexudative age-related macular degeneration, right eye, intermediate dry stage: Secondary | ICD-10-CM | POA: Diagnosis not present

## 2016-07-20 DIAGNOSIS — E113212 Type 2 diabetes mellitus with mild nonproliferative diabetic retinopathy with macular edema, left eye: Secondary | ICD-10-CM

## 2016-07-20 DIAGNOSIS — E113291 Type 2 diabetes mellitus with mild nonproliferative diabetic retinopathy without macular edema, right eye: Secondary | ICD-10-CM | POA: Diagnosis not present

## 2016-07-20 DIAGNOSIS — I10 Essential (primary) hypertension: Secondary | ICD-10-CM

## 2016-07-20 DIAGNOSIS — H353221 Exudative age-related macular degeneration, left eye, with active choroidal neovascularization: Secondary | ICD-10-CM

## 2016-07-20 DIAGNOSIS — H43813 Vitreous degeneration, bilateral: Secondary | ICD-10-CM | POA: Diagnosis not present

## 2016-07-20 DIAGNOSIS — E11311 Type 2 diabetes mellitus with unspecified diabetic retinopathy with macular edema: Secondary | ICD-10-CM | POA: Diagnosis not present

## 2016-08-11 ENCOUNTER — Encounter (INDEPENDENT_AMBULATORY_CARE_PROVIDER_SITE_OTHER): Payer: Medicare Other | Admitting: Ophthalmology

## 2016-08-11 DIAGNOSIS — H35033 Hypertensive retinopathy, bilateral: Secondary | ICD-10-CM

## 2016-08-11 DIAGNOSIS — E113293 Type 2 diabetes mellitus with mild nonproliferative diabetic retinopathy without macular edema, bilateral: Secondary | ICD-10-CM | POA: Diagnosis not present

## 2016-08-11 DIAGNOSIS — I10 Essential (primary) hypertension: Secondary | ICD-10-CM

## 2016-08-11 DIAGNOSIS — H43813 Vitreous degeneration, bilateral: Secondary | ICD-10-CM | POA: Diagnosis not present

## 2016-08-11 DIAGNOSIS — E11319 Type 2 diabetes mellitus with unspecified diabetic retinopathy without macular edema: Secondary | ICD-10-CM | POA: Diagnosis not present

## 2016-08-11 DIAGNOSIS — H353112 Nonexudative age-related macular degeneration, right eye, intermediate dry stage: Secondary | ICD-10-CM

## 2016-08-11 DIAGNOSIS — H353221 Exudative age-related macular degeneration, left eye, with active choroidal neovascularization: Secondary | ICD-10-CM | POA: Diagnosis not present

## 2016-09-08 ENCOUNTER — Encounter (INDEPENDENT_AMBULATORY_CARE_PROVIDER_SITE_OTHER): Payer: Medicare Other | Admitting: Ophthalmology

## 2016-09-08 DIAGNOSIS — I10 Essential (primary) hypertension: Secondary | ICD-10-CM | POA: Diagnosis not present

## 2016-09-08 DIAGNOSIS — E11311 Type 2 diabetes mellitus with unspecified diabetic retinopathy with macular edema: Secondary | ICD-10-CM

## 2016-09-08 DIAGNOSIS — H43813 Vitreous degeneration, bilateral: Secondary | ICD-10-CM | POA: Diagnosis not present

## 2016-09-08 DIAGNOSIS — H353112 Nonexudative age-related macular degeneration, right eye, intermediate dry stage: Secondary | ICD-10-CM

## 2016-09-08 DIAGNOSIS — H35033 Hypertensive retinopathy, bilateral: Secondary | ICD-10-CM | POA: Diagnosis not present

## 2016-09-08 DIAGNOSIS — H353221 Exudative age-related macular degeneration, left eye, with active choroidal neovascularization: Secondary | ICD-10-CM | POA: Diagnosis not present

## 2016-09-08 DIAGNOSIS — E113291 Type 2 diabetes mellitus with mild nonproliferative diabetic retinopathy without macular edema, right eye: Secondary | ICD-10-CM

## 2016-09-08 DIAGNOSIS — E113212 Type 2 diabetes mellitus with mild nonproliferative diabetic retinopathy with macular edema, left eye: Secondary | ICD-10-CM

## 2016-10-05 ENCOUNTER — Encounter (INDEPENDENT_AMBULATORY_CARE_PROVIDER_SITE_OTHER): Payer: Medicare Other | Admitting: Ophthalmology

## 2016-10-05 DIAGNOSIS — E11311 Type 2 diabetes mellitus with unspecified diabetic retinopathy with macular edema: Secondary | ICD-10-CM

## 2016-10-05 DIAGNOSIS — H353221 Exudative age-related macular degeneration, left eye, with active choroidal neovascularization: Secondary | ICD-10-CM | POA: Diagnosis not present

## 2016-10-05 DIAGNOSIS — H43813 Vitreous degeneration, bilateral: Secondary | ICD-10-CM

## 2016-10-05 DIAGNOSIS — E113391 Type 2 diabetes mellitus with moderate nonproliferative diabetic retinopathy without macular edema, right eye: Secondary | ICD-10-CM

## 2016-10-05 DIAGNOSIS — H353112 Nonexudative age-related macular degeneration, right eye, intermediate dry stage: Secondary | ICD-10-CM

## 2016-10-05 DIAGNOSIS — H35033 Hypertensive retinopathy, bilateral: Secondary | ICD-10-CM | POA: Diagnosis not present

## 2016-10-05 DIAGNOSIS — E113212 Type 2 diabetes mellitus with mild nonproliferative diabetic retinopathy with macular edema, left eye: Secondary | ICD-10-CM

## 2016-10-05 DIAGNOSIS — I10 Essential (primary) hypertension: Secondary | ICD-10-CM | POA: Diagnosis not present

## 2016-10-12 IMAGING — CR DG CHEST 2V
2 series · 2 of 2 positions shown · non-contrast
Comparison: Chest x-ray of October 26, 2014 and PET-CT study December 11, 2014.

CLINICAL DATA: Preoperative chest x-ray prior to video-assisted
thoracic surgery planned for [DATE]

EXAM:
CHEST  2 VIEW

[w chest pa]
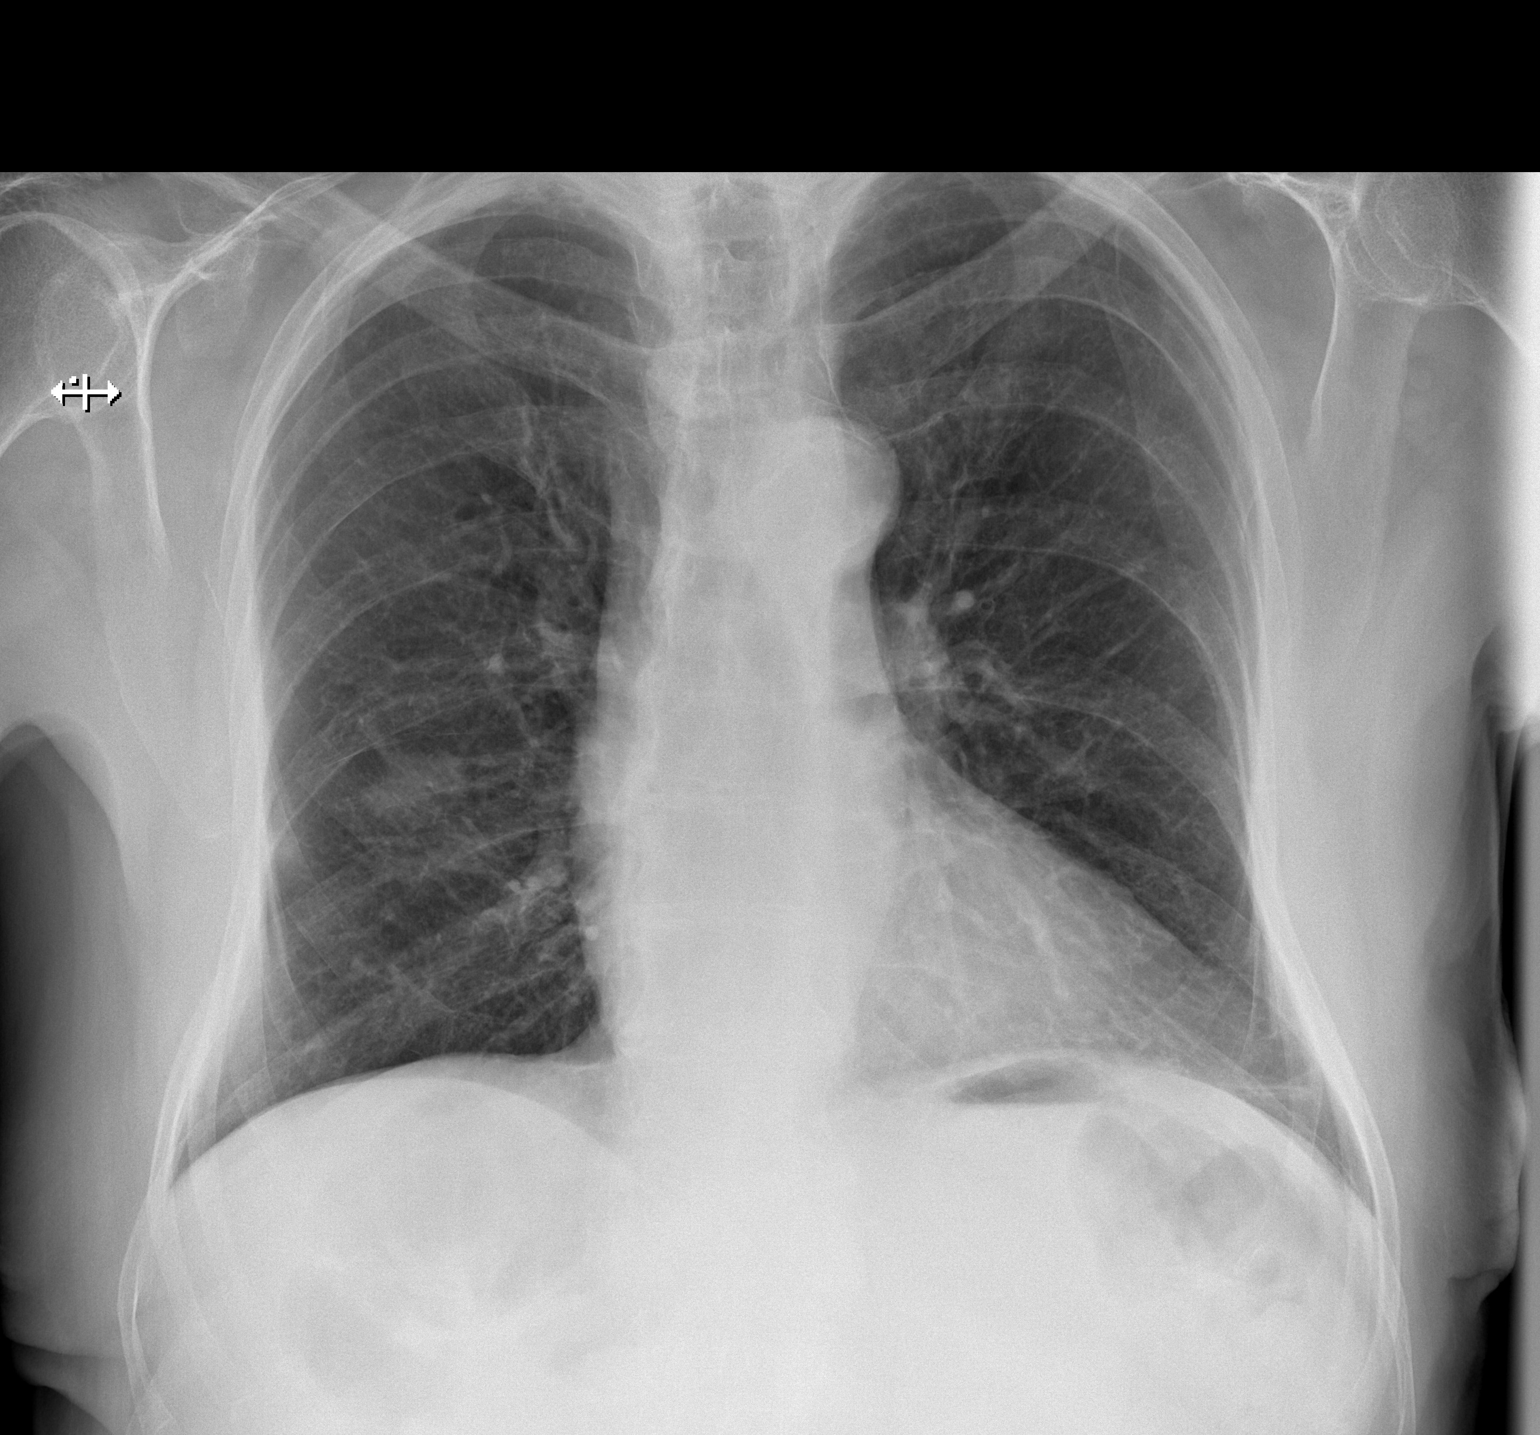

[w chest lat]
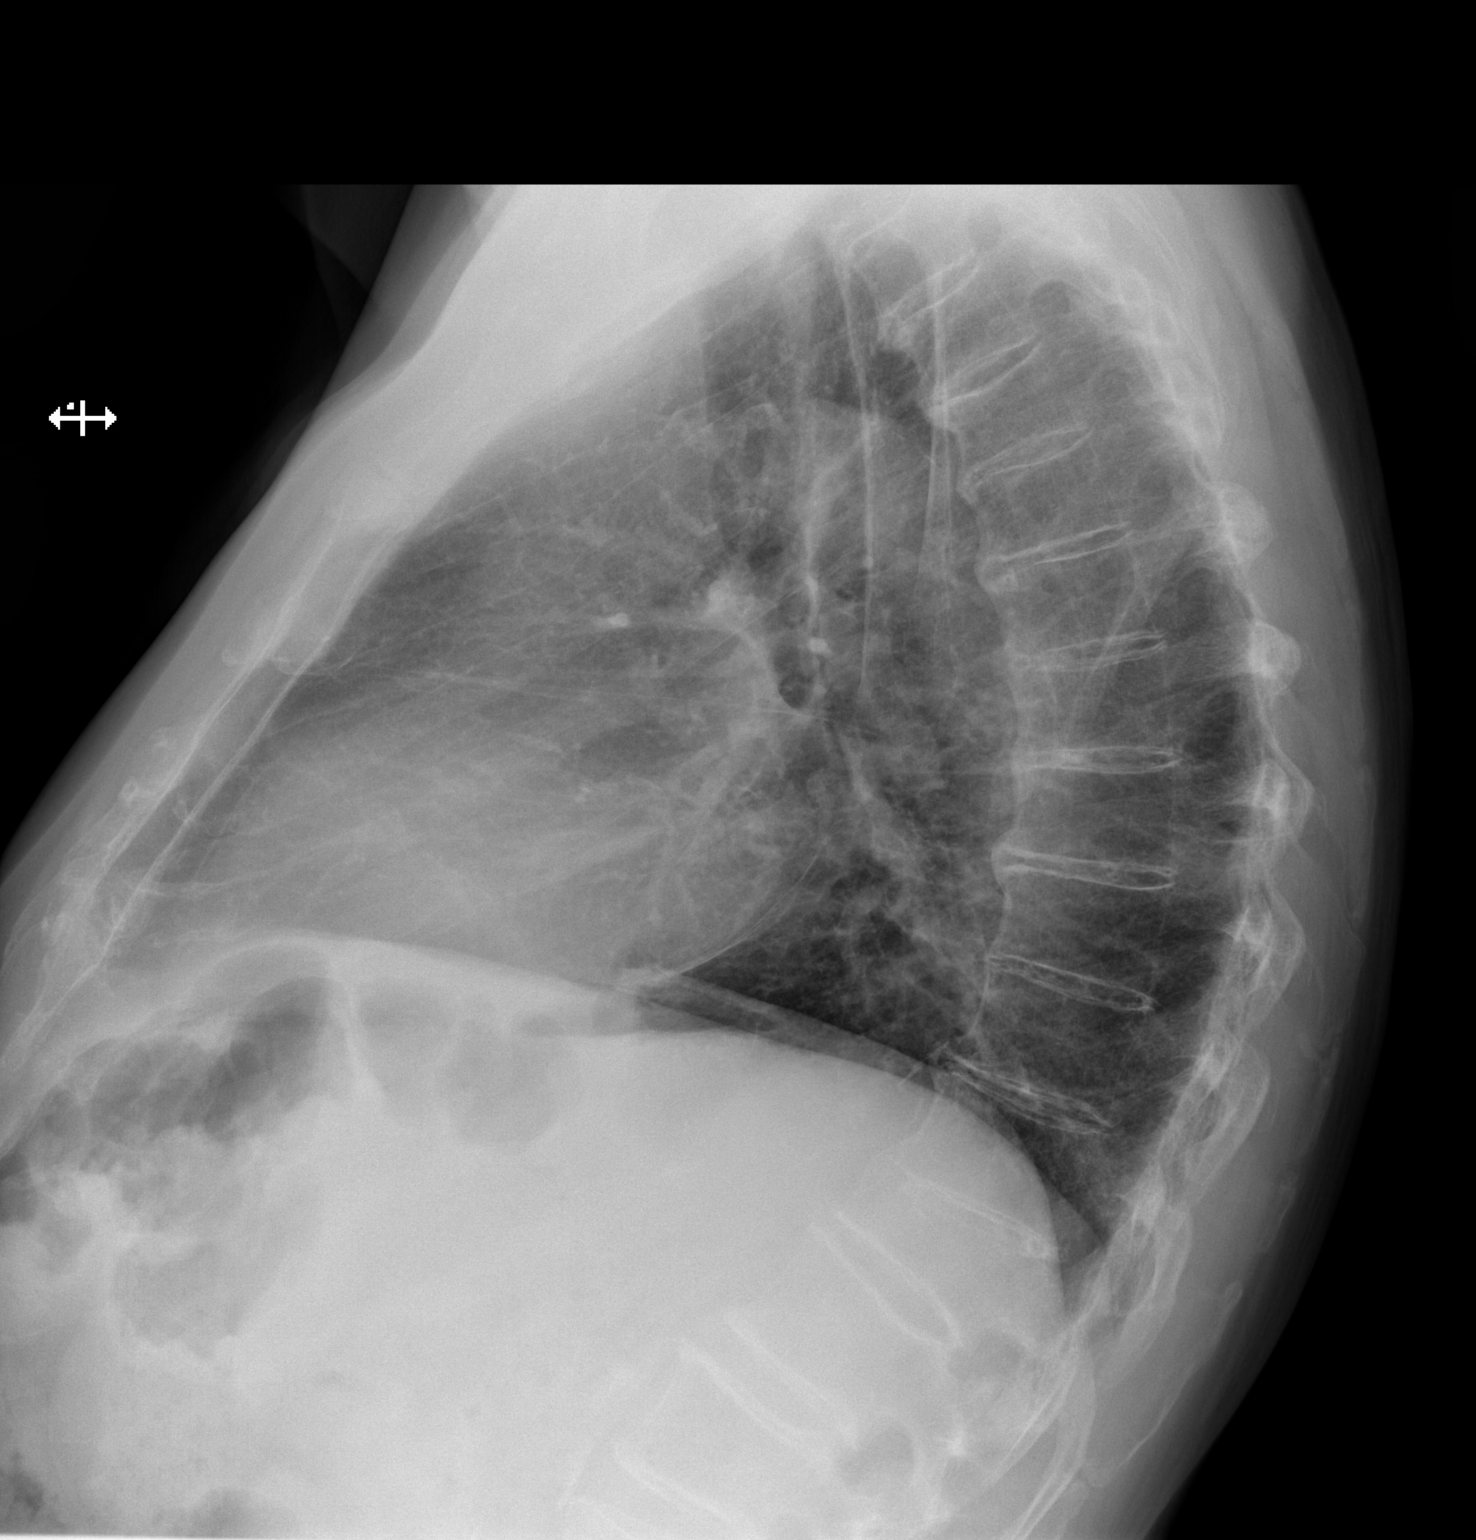

[2 of 2 positions shown; findings below may reference images not displayed]

FINDINGS: The lungs are well-expanded. There is a known a known abnormal right
lower lobe nodule. The pulmonary interstitial markings are coarse
but stable. The heart is top-normal in size. The pulmonary
vascularity is not engorged. There is no pleural effusion or
pneumothorax. The mediastinum is normal in width. There is mild
tortuosity of the descending thoracic aorta. There is 40% anterior
wedge compression of the body of T12 which is stable.
IMPRESSION: Known hypermetabolic right lower lobe pulmonary nodule. There is no
active cardiopulmonary disease otherwise.

## 2016-10-16 IMAGING — DX DG CHEST 1V PORT
1 series · 1 of 1 positions shown · non-contrast
Comparison: 01/22/2015

CLINICAL DATA: Post opt right VATS, chest tube.

EXAM:
PORTABLE CHEST - 1 VIEW

[chest ap]
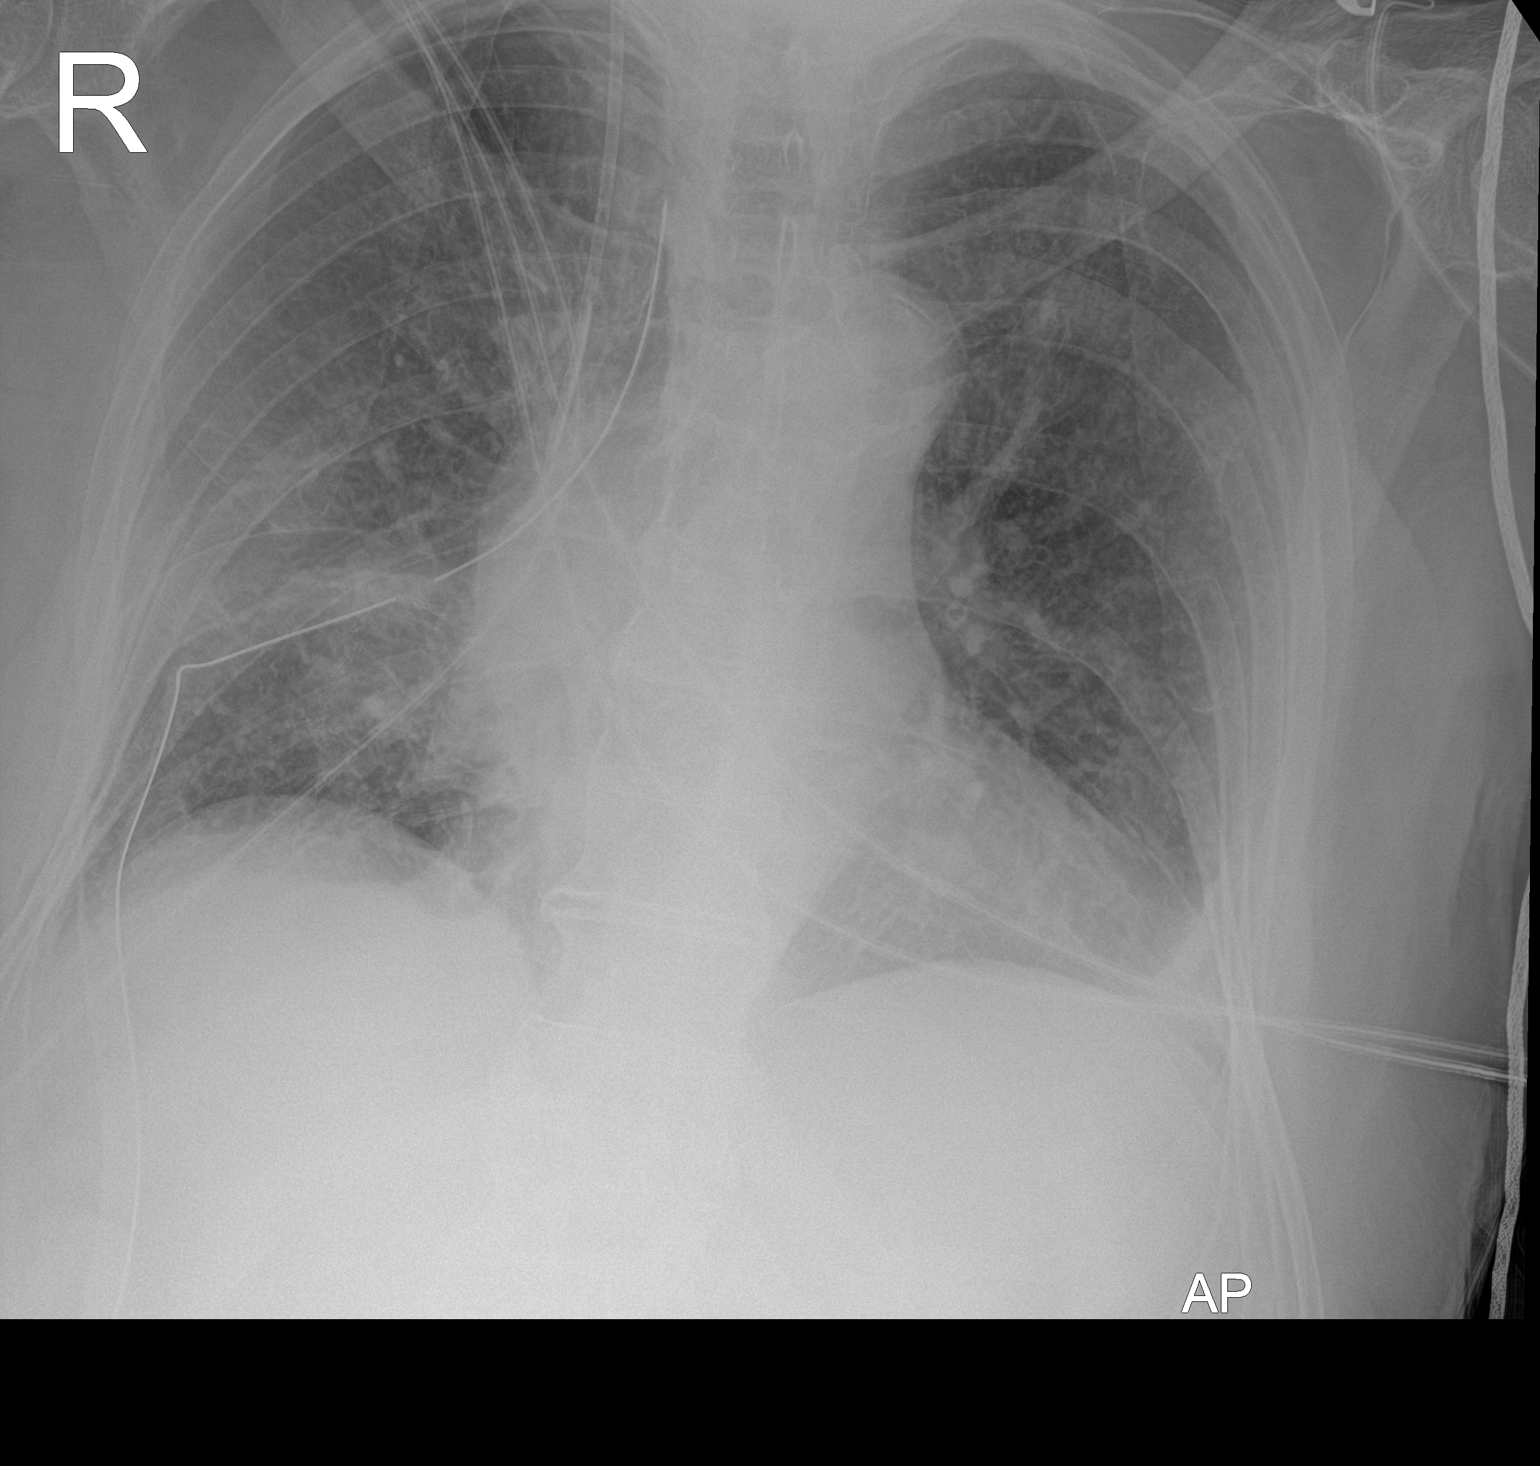

[1 of 1 positions shown; findings below may reference images not displayed]

FINDINGS: Right central line tip is at the cavoatrial junction. Right chest
tube in place. Postoperative changes on the right. Small right
pneumothorax. Bibasilar atelectasis noted. Heart is mildly enlarged.
IMPRESSION: Postoperative changes on the right with right chest tube in place.
Small right apical pneumothorax.

Bibasilar atelectasis.

## 2016-10-16 IMAGING — CR DG CHEST 1V PORT
1 series · 1 of 1 positions shown · non-contrast
Comparison: 01/26/2015 at [DATE] a.m..

CLINICAL DATA: Chest tubes in place.

EXAM:
PORTABLE CHEST - 1 VIEW

[AP]
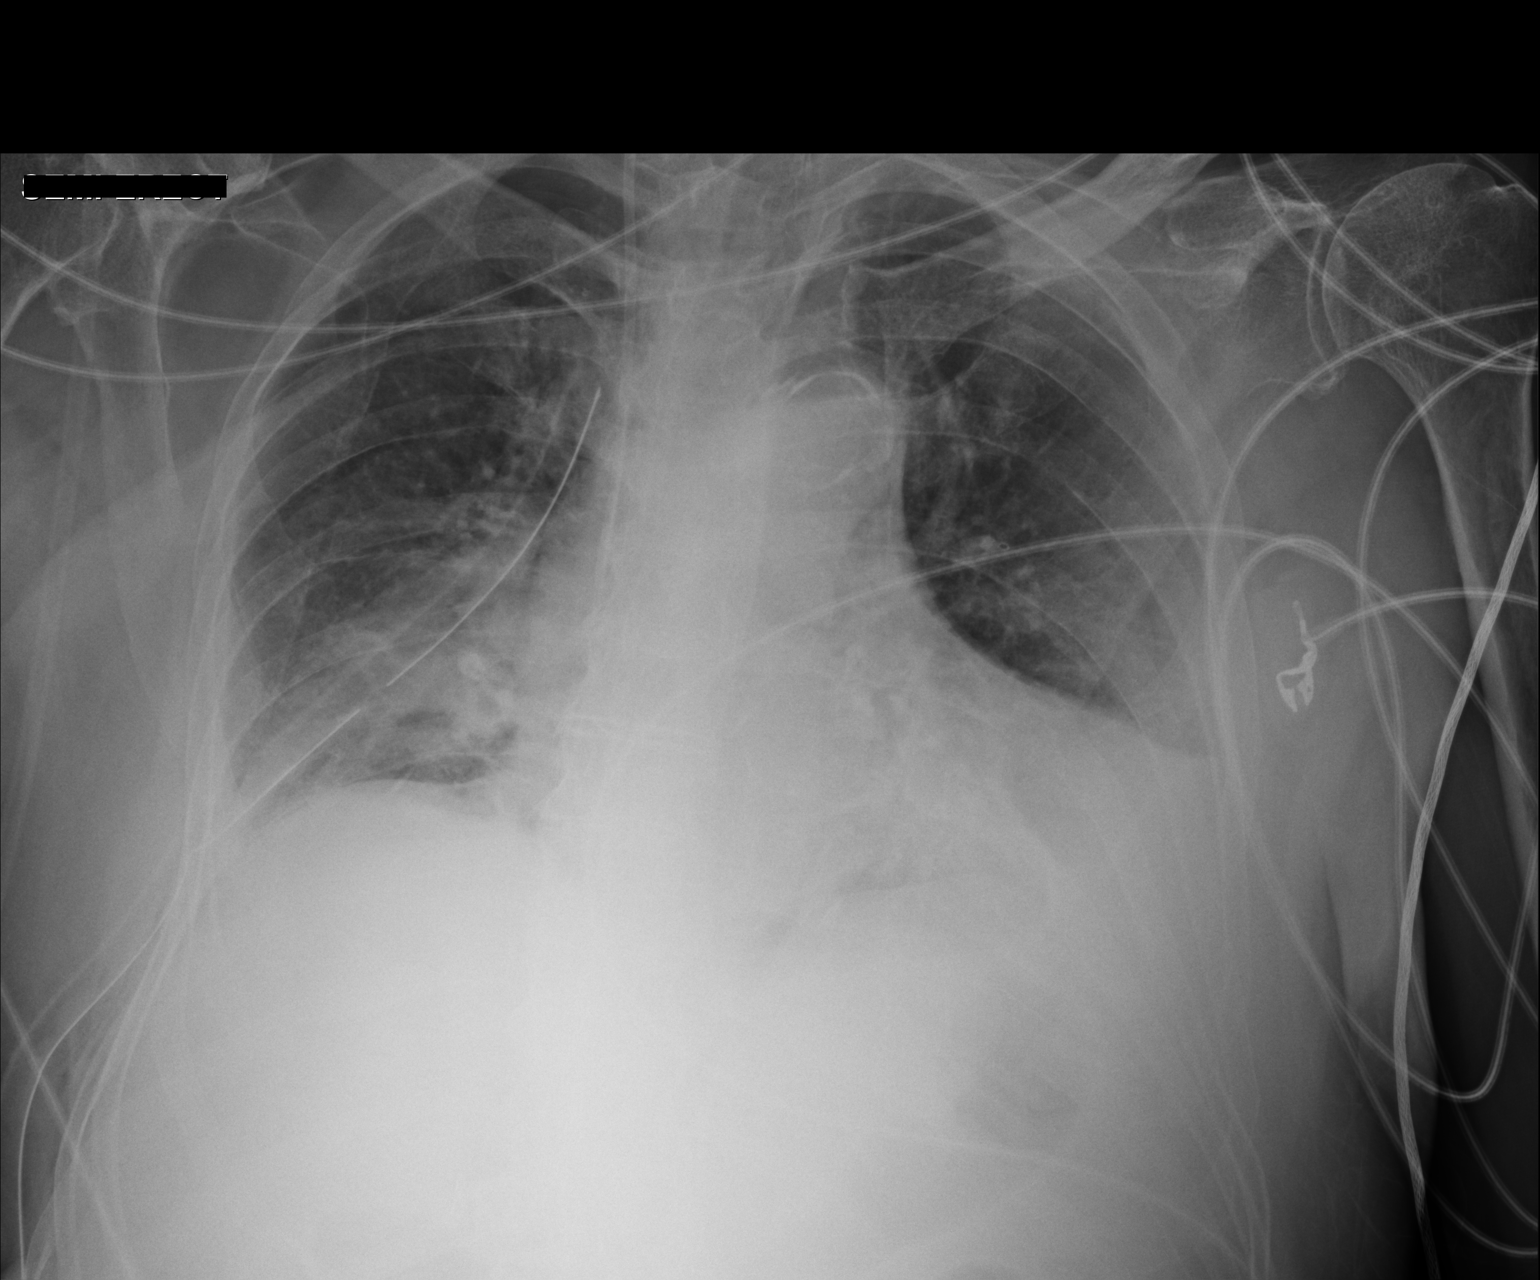

[1 of 1 positions shown; findings below may reference images not displayed]

FINDINGS: Right IJ central venous catheter unchanged. Right-sided chest tube
has been adjusted slightly and appears in adequate position. There
is persistence of a small right apical pneumothorax without
significant change. Persistent opacification over the right midlung
slightly worse likely atelectasis post VATS. Mild left base
opacification which is new likely atelectasis and possible small
amount of left pleural fluid. Remainder of the exam is unchanged.
IMPRESSION: Slight worsening opacification over the right midlung likely
atelectasis related to recent thoracic procedure. Mild worsening
left base opacification likely small effusion and atelectasis.

Right-sided chest tube appears in adequate position. Stable small
right apical pneumothorax.

## 2016-10-17 IMAGING — CR DG CHEST 1V PORT
1 series · 1 of 1 positions shown · non-contrast
Comparison: Yesterday

CLINICAL DATA: Lung cancer

EXAM:
PORTABLE CHEST - 1 VIEW

[AP]
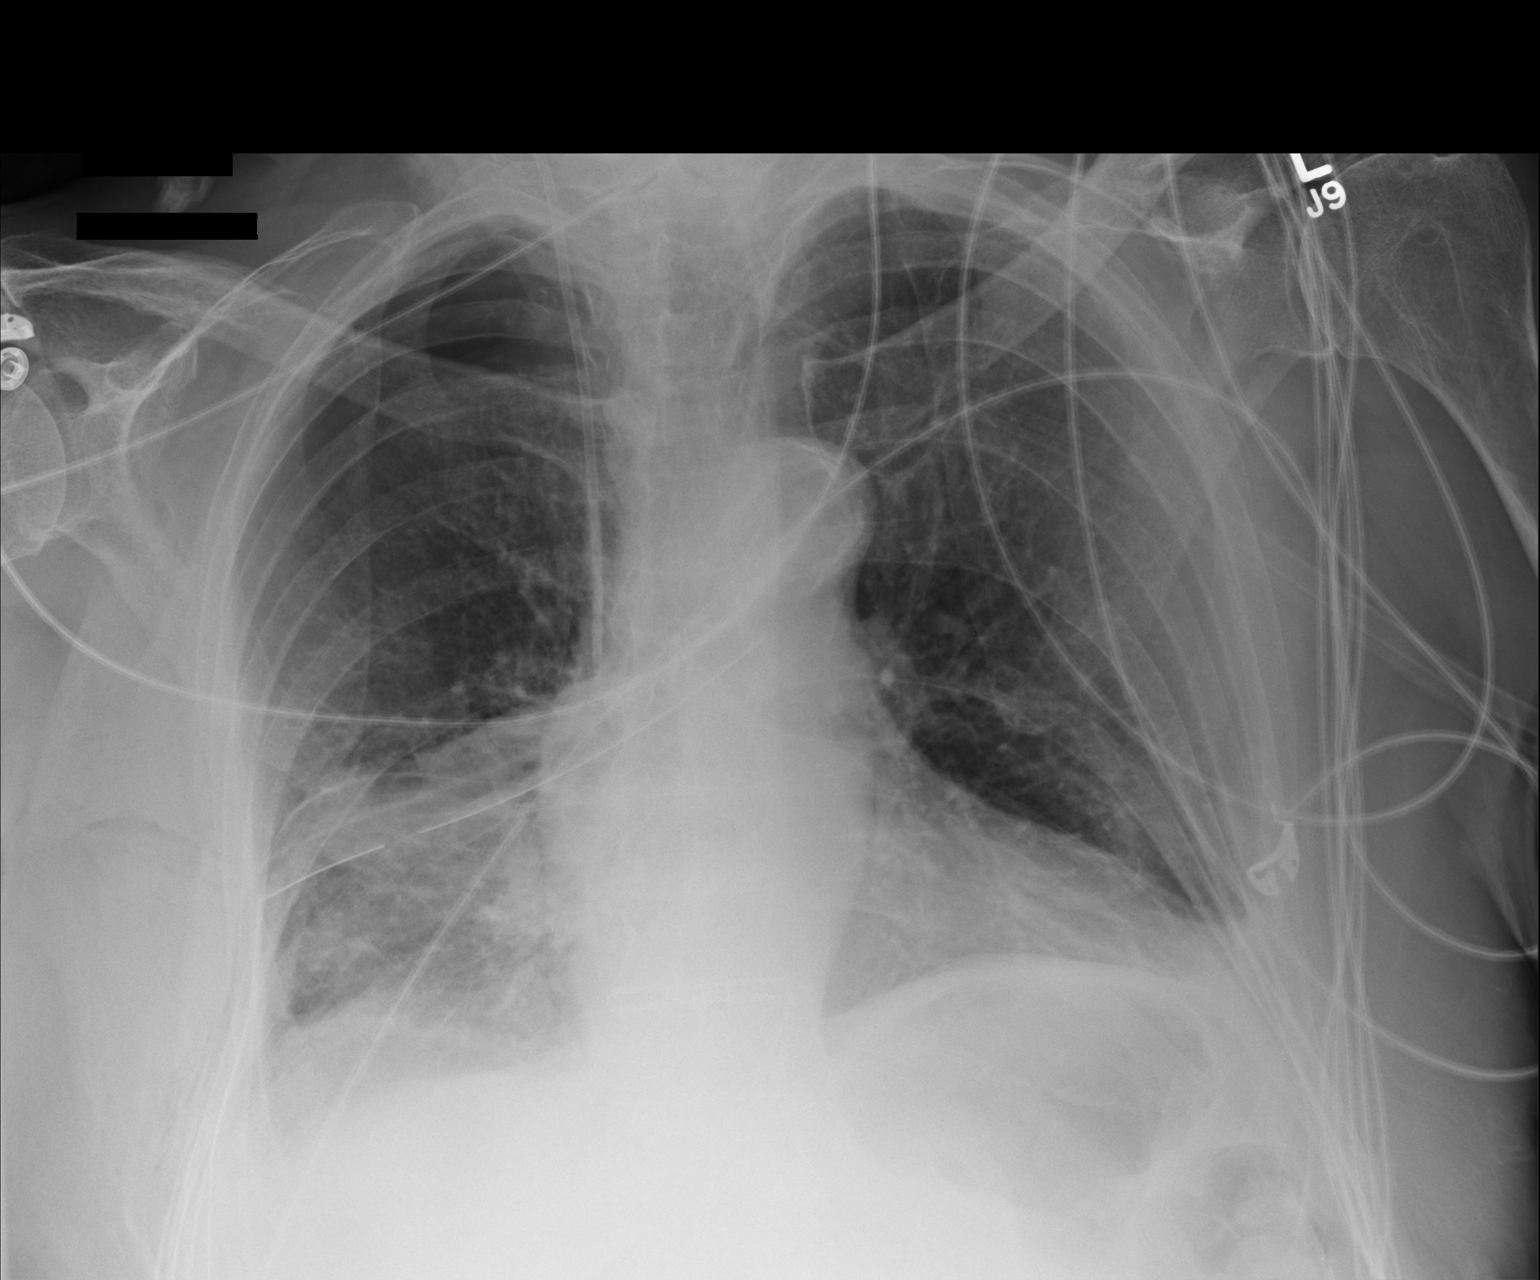

[1 of 1 positions shown; findings below may reference images not displayed]

FINDINGS: A right-sided chest tube has a more horizontal course and is more
but partially kinked at the chest wall. The tube is presumably
anterior given it reaches the midline. Right IJ central line is in
stable position.

Interval increase in a still small right apical pneumothorax, now
estimated at 10-20%.

Stable heart size and mediastinal contours. Bibasilar atelectasis
and small left pleural effusion.

These results will be called to the ordering clinician or
representative by the Radiologist Assistant, and communication
documented in the PACS or zVision Dashboard.
IMPRESSION: 1. Modest increase in right pneumothorax, now estimated at 10-20%.
2. Right chest tube has a more horizontal and kinked appearance.
3. Bibasilar atelectasis and small left pleural effusion, stable to
yesterday.

## 2016-11-02 ENCOUNTER — Encounter (INDEPENDENT_AMBULATORY_CARE_PROVIDER_SITE_OTHER): Payer: Medicare Other | Admitting: Ophthalmology

## 2016-11-02 DIAGNOSIS — H35033 Hypertensive retinopathy, bilateral: Secondary | ICD-10-CM | POA: Diagnosis not present

## 2016-11-02 DIAGNOSIS — E11311 Type 2 diabetes mellitus with unspecified diabetic retinopathy with macular edema: Secondary | ICD-10-CM

## 2016-11-02 DIAGNOSIS — E113391 Type 2 diabetes mellitus with moderate nonproliferative diabetic retinopathy without macular edema, right eye: Secondary | ICD-10-CM

## 2016-11-02 DIAGNOSIS — I10 Essential (primary) hypertension: Secondary | ICD-10-CM

## 2016-11-02 DIAGNOSIS — E113312 Type 2 diabetes mellitus with moderate nonproliferative diabetic retinopathy with macular edema, left eye: Secondary | ICD-10-CM

## 2016-11-02 DIAGNOSIS — H43813 Vitreous degeneration, bilateral: Secondary | ICD-10-CM

## 2016-11-02 DIAGNOSIS — H353221 Exudative age-related macular degeneration, left eye, with active choroidal neovascularization: Secondary | ICD-10-CM

## 2016-11-02 DIAGNOSIS — H353112 Nonexudative age-related macular degeneration, right eye, intermediate dry stage: Secondary | ICD-10-CM | POA: Diagnosis not present

## 2016-11-30 ENCOUNTER — Encounter (INDEPENDENT_AMBULATORY_CARE_PROVIDER_SITE_OTHER): Payer: Medicare Other | Admitting: Ophthalmology

## 2016-11-30 DIAGNOSIS — H353221 Exudative age-related macular degeneration, left eye, with active choroidal neovascularization: Secondary | ICD-10-CM | POA: Diagnosis not present

## 2016-11-30 DIAGNOSIS — H35033 Hypertensive retinopathy, bilateral: Secondary | ICD-10-CM

## 2016-11-30 DIAGNOSIS — I10 Essential (primary) hypertension: Secondary | ICD-10-CM | POA: Diagnosis not present

## 2016-11-30 DIAGNOSIS — E113293 Type 2 diabetes mellitus with mild nonproliferative diabetic retinopathy without macular edema, bilateral: Secondary | ICD-10-CM

## 2016-11-30 DIAGNOSIS — H43813 Vitreous degeneration, bilateral: Secondary | ICD-10-CM

## 2016-11-30 DIAGNOSIS — E11319 Type 2 diabetes mellitus with unspecified diabetic retinopathy without macular edema: Secondary | ICD-10-CM | POA: Diagnosis not present

## 2016-11-30 DIAGNOSIS — H353112 Nonexudative age-related macular degeneration, right eye, intermediate dry stage: Secondary | ICD-10-CM | POA: Diagnosis not present

## 2016-12-28 ENCOUNTER — Encounter (INDEPENDENT_AMBULATORY_CARE_PROVIDER_SITE_OTHER): Payer: Medicare Other | Admitting: Ophthalmology

## 2016-12-28 DIAGNOSIS — E113293 Type 2 diabetes mellitus with mild nonproliferative diabetic retinopathy without macular edema, bilateral: Secondary | ICD-10-CM | POA: Diagnosis not present

## 2016-12-28 DIAGNOSIS — H353112 Nonexudative age-related macular degeneration, right eye, intermediate dry stage: Secondary | ICD-10-CM | POA: Diagnosis not present

## 2016-12-28 DIAGNOSIS — H35033 Hypertensive retinopathy, bilateral: Secondary | ICD-10-CM

## 2016-12-28 DIAGNOSIS — E11319 Type 2 diabetes mellitus with unspecified diabetic retinopathy without macular edema: Secondary | ICD-10-CM | POA: Diagnosis not present

## 2016-12-28 DIAGNOSIS — I10 Essential (primary) hypertension: Secondary | ICD-10-CM

## 2016-12-28 DIAGNOSIS — H353221 Exudative age-related macular degeneration, left eye, with active choroidal neovascularization: Secondary | ICD-10-CM

## 2016-12-28 DIAGNOSIS — H43813 Vitreous degeneration, bilateral: Secondary | ICD-10-CM

## 2017-01-24 ENCOUNTER — Encounter (INDEPENDENT_AMBULATORY_CARE_PROVIDER_SITE_OTHER): Payer: Medicare Other | Admitting: Ophthalmology

## 2017-01-24 DIAGNOSIS — E113293 Type 2 diabetes mellitus with mild nonproliferative diabetic retinopathy without macular edema, bilateral: Secondary | ICD-10-CM

## 2017-01-24 DIAGNOSIS — H353112 Nonexudative age-related macular degeneration, right eye, intermediate dry stage: Secondary | ICD-10-CM

## 2017-01-24 DIAGNOSIS — H353221 Exudative age-related macular degeneration, left eye, with active choroidal neovascularization: Secondary | ICD-10-CM

## 2017-01-24 DIAGNOSIS — H43813 Vitreous degeneration, bilateral: Secondary | ICD-10-CM | POA: Diagnosis not present

## 2017-01-24 DIAGNOSIS — H35033 Hypertensive retinopathy, bilateral: Secondary | ICD-10-CM

## 2017-01-24 DIAGNOSIS — E11319 Type 2 diabetes mellitus with unspecified diabetic retinopathy without macular edema: Secondary | ICD-10-CM | POA: Diagnosis not present

## 2017-01-24 DIAGNOSIS — I10 Essential (primary) hypertension: Secondary | ICD-10-CM | POA: Diagnosis not present

## 2017-02-22 ENCOUNTER — Encounter (INDEPENDENT_AMBULATORY_CARE_PROVIDER_SITE_OTHER): Payer: Medicare Other | Admitting: Ophthalmology

## 2017-02-22 DIAGNOSIS — H353221 Exudative age-related macular degeneration, left eye, with active choroidal neovascularization: Secondary | ICD-10-CM

## 2017-02-22 DIAGNOSIS — E113293 Type 2 diabetes mellitus with mild nonproliferative diabetic retinopathy without macular edema, bilateral: Secondary | ICD-10-CM

## 2017-02-22 DIAGNOSIS — I10 Essential (primary) hypertension: Secondary | ICD-10-CM | POA: Diagnosis not present

## 2017-02-22 DIAGNOSIS — H353112 Nonexudative age-related macular degeneration, right eye, intermediate dry stage: Secondary | ICD-10-CM

## 2017-02-22 DIAGNOSIS — H43813 Vitreous degeneration, bilateral: Secondary | ICD-10-CM

## 2017-02-22 DIAGNOSIS — H35033 Hypertensive retinopathy, bilateral: Secondary | ICD-10-CM | POA: Diagnosis not present

## 2017-02-22 DIAGNOSIS — E11319 Type 2 diabetes mellitus with unspecified diabetic retinopathy without macular edema: Secondary | ICD-10-CM

## 2017-03-08 ENCOUNTER — Other Ambulatory Visit: Payer: Self-pay | Admitting: *Deleted

## 2017-03-08 DIAGNOSIS — I6523 Occlusion and stenosis of bilateral carotid arteries: Secondary | ICD-10-CM

## 2017-03-22 ENCOUNTER — Encounter (INDEPENDENT_AMBULATORY_CARE_PROVIDER_SITE_OTHER): Payer: Medicare Other | Admitting: Ophthalmology

## 2017-03-22 DIAGNOSIS — H353112 Nonexudative age-related macular degeneration, right eye, intermediate dry stage: Secondary | ICD-10-CM

## 2017-03-22 DIAGNOSIS — E113312 Type 2 diabetes mellitus with moderate nonproliferative diabetic retinopathy with macular edema, left eye: Secondary | ICD-10-CM

## 2017-03-22 DIAGNOSIS — H353221 Exudative age-related macular degeneration, left eye, with active choroidal neovascularization: Secondary | ICD-10-CM | POA: Diagnosis not present

## 2017-03-22 DIAGNOSIS — E11311 Type 2 diabetes mellitus with unspecified diabetic retinopathy with macular edema: Secondary | ICD-10-CM | POA: Diagnosis not present

## 2017-03-22 DIAGNOSIS — H35033 Hypertensive retinopathy, bilateral: Secondary | ICD-10-CM | POA: Diagnosis not present

## 2017-03-22 DIAGNOSIS — E113291 Type 2 diabetes mellitus with mild nonproliferative diabetic retinopathy without macular edema, right eye: Secondary | ICD-10-CM

## 2017-03-22 DIAGNOSIS — H43813 Vitreous degeneration, bilateral: Secondary | ICD-10-CM

## 2017-03-22 DIAGNOSIS — I10 Essential (primary) hypertension: Secondary | ICD-10-CM

## 2017-04-19 ENCOUNTER — Encounter (INDEPENDENT_AMBULATORY_CARE_PROVIDER_SITE_OTHER): Payer: Medicare Other | Admitting: Ophthalmology

## 2017-04-19 DIAGNOSIS — I10 Essential (primary) hypertension: Secondary | ICD-10-CM

## 2017-04-19 DIAGNOSIS — H353221 Exudative age-related macular degeneration, left eye, with active choroidal neovascularization: Secondary | ICD-10-CM

## 2017-04-19 DIAGNOSIS — H353112 Nonexudative age-related macular degeneration, right eye, intermediate dry stage: Secondary | ICD-10-CM

## 2017-04-19 DIAGNOSIS — H35033 Hypertensive retinopathy, bilateral: Secondary | ICD-10-CM | POA: Diagnosis not present

## 2017-04-19 DIAGNOSIS — E11319 Type 2 diabetes mellitus with unspecified diabetic retinopathy without macular edema: Secondary | ICD-10-CM

## 2017-04-19 DIAGNOSIS — E113293 Type 2 diabetes mellitus with mild nonproliferative diabetic retinopathy without macular edema, bilateral: Secondary | ICD-10-CM

## 2017-04-19 DIAGNOSIS — H43813 Vitreous degeneration, bilateral: Secondary | ICD-10-CM

## 2017-05-24 ENCOUNTER — Encounter (INDEPENDENT_AMBULATORY_CARE_PROVIDER_SITE_OTHER): Payer: Medicare Other | Admitting: Ophthalmology

## 2017-05-24 DIAGNOSIS — E113291 Type 2 diabetes mellitus with mild nonproliferative diabetic retinopathy without macular edema, right eye: Secondary | ICD-10-CM

## 2017-05-24 DIAGNOSIS — I10 Essential (primary) hypertension: Secondary | ICD-10-CM | POA: Diagnosis not present

## 2017-05-24 DIAGNOSIS — H43813 Vitreous degeneration, bilateral: Secondary | ICD-10-CM

## 2017-05-24 DIAGNOSIS — H353112 Nonexudative age-related macular degeneration, right eye, intermediate dry stage: Secondary | ICD-10-CM | POA: Diagnosis not present

## 2017-05-24 DIAGNOSIS — H353221 Exudative age-related macular degeneration, left eye, with active choroidal neovascularization: Secondary | ICD-10-CM | POA: Diagnosis not present

## 2017-05-24 DIAGNOSIS — H35033 Hypertensive retinopathy, bilateral: Secondary | ICD-10-CM

## 2017-05-24 DIAGNOSIS — E113212 Type 2 diabetes mellitus with mild nonproliferative diabetic retinopathy with macular edema, left eye: Secondary | ICD-10-CM

## 2017-05-24 DIAGNOSIS — E11311 Type 2 diabetes mellitus with unspecified diabetic retinopathy with macular edema: Secondary | ICD-10-CM

## 2017-06-14 ENCOUNTER — Ambulatory Visit (HOSPITAL_COMMUNITY)
Admission: RE | Admit: 2017-06-14 | Discharge: 2017-06-14 | Disposition: A | Discharge status: Home / Self Care | Payer: Medicare Other | Source: Ambulatory Visit | Attending: Cardiovascular Disease | Admitting: Cardiovascular Disease

## 2017-06-14 DIAGNOSIS — I6523 Occlusion and stenosis of bilateral carotid arteries: Secondary | ICD-10-CM | POA: Diagnosis not present

## 2017-06-18 ENCOUNTER — Other Ambulatory Visit: Payer: Self-pay | Admitting: *Deleted

## 2017-06-18 DIAGNOSIS — I679 Cerebrovascular disease, unspecified: Secondary | ICD-10-CM

## 2017-06-21 ENCOUNTER — Encounter (INDEPENDENT_AMBULATORY_CARE_PROVIDER_SITE_OTHER): Payer: Medicare Other | Admitting: Ophthalmology

## 2017-06-21 DIAGNOSIS — H35033 Hypertensive retinopathy, bilateral: Secondary | ICD-10-CM | POA: Diagnosis not present

## 2017-06-21 DIAGNOSIS — E113312 Type 2 diabetes mellitus with moderate nonproliferative diabetic retinopathy with macular edema, left eye: Secondary | ICD-10-CM

## 2017-06-21 DIAGNOSIS — I10 Essential (primary) hypertension: Secondary | ICD-10-CM | POA: Diagnosis not present

## 2017-06-21 DIAGNOSIS — H353221 Exudative age-related macular degeneration, left eye, with active choroidal neovascularization: Secondary | ICD-10-CM

## 2017-06-21 DIAGNOSIS — H353112 Nonexudative age-related macular degeneration, right eye, intermediate dry stage: Secondary | ICD-10-CM

## 2017-06-21 DIAGNOSIS — H43813 Vitreous degeneration, bilateral: Secondary | ICD-10-CM

## 2017-06-21 DIAGNOSIS — E11311 Type 2 diabetes mellitus with unspecified diabetic retinopathy with macular edema: Secondary | ICD-10-CM

## 2017-06-21 DIAGNOSIS — E113391 Type 2 diabetes mellitus with moderate nonproliferative diabetic retinopathy without macular edema, right eye: Secondary | ICD-10-CM

## 2017-07-19 ENCOUNTER — Encounter (INDEPENDENT_AMBULATORY_CARE_PROVIDER_SITE_OTHER): Payer: Medicare Other | Admitting: Ophthalmology

## 2017-07-19 DIAGNOSIS — H43813 Vitreous degeneration, bilateral: Secondary | ICD-10-CM

## 2017-07-19 DIAGNOSIS — I10 Essential (primary) hypertension: Secondary | ICD-10-CM

## 2017-07-19 DIAGNOSIS — E11311 Type 2 diabetes mellitus with unspecified diabetic retinopathy with macular edema: Secondary | ICD-10-CM | POA: Diagnosis not present

## 2017-07-19 DIAGNOSIS — H353112 Nonexudative age-related macular degeneration, right eye, intermediate dry stage: Secondary | ICD-10-CM | POA: Diagnosis not present

## 2017-07-19 DIAGNOSIS — H353221 Exudative age-related macular degeneration, left eye, with active choroidal neovascularization: Secondary | ICD-10-CM | POA: Diagnosis not present

## 2017-07-19 DIAGNOSIS — E113312 Type 2 diabetes mellitus with moderate nonproliferative diabetic retinopathy with macular edema, left eye: Secondary | ICD-10-CM

## 2017-07-19 DIAGNOSIS — H35033 Hypertensive retinopathy, bilateral: Secondary | ICD-10-CM

## 2017-07-19 DIAGNOSIS — E113291 Type 2 diabetes mellitus with mild nonproliferative diabetic retinopathy without macular edema, right eye: Secondary | ICD-10-CM

## 2017-08-16 ENCOUNTER — Encounter (INDEPENDENT_AMBULATORY_CARE_PROVIDER_SITE_OTHER): Payer: Medicare Other | Admitting: Ophthalmology

## 2017-08-16 DIAGNOSIS — H35033 Hypertensive retinopathy, bilateral: Secondary | ICD-10-CM | POA: Diagnosis not present

## 2017-08-16 DIAGNOSIS — I10 Essential (primary) hypertension: Secondary | ICD-10-CM

## 2017-08-16 DIAGNOSIS — H353221 Exudative age-related macular degeneration, left eye, with active choroidal neovascularization: Secondary | ICD-10-CM | POA: Diagnosis not present

## 2017-08-16 DIAGNOSIS — H353112 Nonexudative age-related macular degeneration, right eye, intermediate dry stage: Secondary | ICD-10-CM | POA: Diagnosis not present

## 2017-08-16 DIAGNOSIS — E113293 Type 2 diabetes mellitus with mild nonproliferative diabetic retinopathy without macular edema, bilateral: Secondary | ICD-10-CM | POA: Diagnosis not present

## 2017-08-16 DIAGNOSIS — H43813 Vitreous degeneration, bilateral: Secondary | ICD-10-CM | POA: Diagnosis not present

## 2017-08-16 DIAGNOSIS — E11319 Type 2 diabetes mellitus with unspecified diabetic retinopathy without macular edema: Secondary | ICD-10-CM

## 2017-09-13 ENCOUNTER — Encounter (INDEPENDENT_AMBULATORY_CARE_PROVIDER_SITE_OTHER): Payer: Medicare Other | Admitting: Ophthalmology

## 2017-09-20 ENCOUNTER — Encounter (INDEPENDENT_AMBULATORY_CARE_PROVIDER_SITE_OTHER): Payer: Medicare Other | Admitting: Ophthalmology

## 2017-09-20 DIAGNOSIS — E113291 Type 2 diabetes mellitus with mild nonproliferative diabetic retinopathy without macular edema, right eye: Secondary | ICD-10-CM

## 2017-09-20 DIAGNOSIS — H353112 Nonexudative age-related macular degeneration, right eye, intermediate dry stage: Secondary | ICD-10-CM

## 2017-09-20 DIAGNOSIS — H35033 Hypertensive retinopathy, bilateral: Secondary | ICD-10-CM

## 2017-09-20 DIAGNOSIS — H43813 Vitreous degeneration, bilateral: Secondary | ICD-10-CM

## 2017-09-20 DIAGNOSIS — E11319 Type 2 diabetes mellitus with unspecified diabetic retinopathy without macular edema: Secondary | ICD-10-CM | POA: Diagnosis not present

## 2017-09-20 DIAGNOSIS — I10 Essential (primary) hypertension: Secondary | ICD-10-CM | POA: Diagnosis not present

## 2017-09-20 DIAGNOSIS — H353221 Exudative age-related macular degeneration, left eye, with active choroidal neovascularization: Secondary | ICD-10-CM

## 2017-09-20 DIAGNOSIS — E113392 Type 2 diabetes mellitus with moderate nonproliferative diabetic retinopathy without macular edema, left eye: Secondary | ICD-10-CM

## 2017-10-18 ENCOUNTER — Encounter (INDEPENDENT_AMBULATORY_CARE_PROVIDER_SITE_OTHER): Payer: Medicare Other | Admitting: Ophthalmology

## 2017-10-18 DIAGNOSIS — H353112 Nonexudative age-related macular degeneration, right eye, intermediate dry stage: Secondary | ICD-10-CM

## 2017-10-18 DIAGNOSIS — I10 Essential (primary) hypertension: Secondary | ICD-10-CM | POA: Diagnosis not present

## 2017-10-18 DIAGNOSIS — H43813 Vitreous degeneration, bilateral: Secondary | ICD-10-CM

## 2017-10-18 DIAGNOSIS — H353221 Exudative age-related macular degeneration, left eye, with active choroidal neovascularization: Secondary | ICD-10-CM

## 2017-10-18 DIAGNOSIS — H35033 Hypertensive retinopathy, bilateral: Secondary | ICD-10-CM

## 2017-10-18 DIAGNOSIS — E113393 Type 2 diabetes mellitus with moderate nonproliferative diabetic retinopathy without macular edema, bilateral: Secondary | ICD-10-CM

## 2017-10-18 DIAGNOSIS — E113319 Type 2 diabetes mellitus with moderate nonproliferative diabetic retinopathy with macular edema, unspecified eye: Secondary | ICD-10-CM

## 2017-11-12 ENCOUNTER — Encounter (INDEPENDENT_AMBULATORY_CARE_PROVIDER_SITE_OTHER): Payer: Medicare Other | Admitting: Ophthalmology

## 2017-11-12 DIAGNOSIS — H353112 Nonexudative age-related macular degeneration, right eye, intermediate dry stage: Secondary | ICD-10-CM | POA: Diagnosis not present

## 2017-11-12 DIAGNOSIS — E113392 Type 2 diabetes mellitus with moderate nonproliferative diabetic retinopathy without macular edema, left eye: Secondary | ICD-10-CM

## 2017-11-12 DIAGNOSIS — E11319 Type 2 diabetes mellitus with unspecified diabetic retinopathy without macular edema: Secondary | ICD-10-CM

## 2017-11-12 DIAGNOSIS — H43813 Vitreous degeneration, bilateral: Secondary | ICD-10-CM

## 2017-11-12 DIAGNOSIS — H35033 Hypertensive retinopathy, bilateral: Secondary | ICD-10-CM

## 2017-11-12 DIAGNOSIS — E113291 Type 2 diabetes mellitus with mild nonproliferative diabetic retinopathy without macular edema, right eye: Secondary | ICD-10-CM | POA: Diagnosis not present

## 2017-11-12 DIAGNOSIS — H353221 Exudative age-related macular degeneration, left eye, with active choroidal neovascularization: Secondary | ICD-10-CM | POA: Diagnosis not present

## 2017-11-12 DIAGNOSIS — I10 Essential (primary) hypertension: Secondary | ICD-10-CM | POA: Diagnosis not present

## 2017-12-20 ENCOUNTER — Encounter (INDEPENDENT_AMBULATORY_CARE_PROVIDER_SITE_OTHER): Payer: Medicare Other | Admitting: Ophthalmology

## 2017-12-20 DIAGNOSIS — H35033 Hypertensive retinopathy, bilateral: Secondary | ICD-10-CM

## 2017-12-20 DIAGNOSIS — E113212 Type 2 diabetes mellitus with mild nonproliferative diabetic retinopathy with macular edema, left eye: Secondary | ICD-10-CM

## 2017-12-20 DIAGNOSIS — I10 Essential (primary) hypertension: Secondary | ICD-10-CM

## 2017-12-20 DIAGNOSIS — E11311 Type 2 diabetes mellitus with unspecified diabetic retinopathy with macular edema: Secondary | ICD-10-CM

## 2017-12-20 DIAGNOSIS — E113291 Type 2 diabetes mellitus with mild nonproliferative diabetic retinopathy without macular edema, right eye: Secondary | ICD-10-CM

## 2017-12-20 DIAGNOSIS — H353221 Exudative age-related macular degeneration, left eye, with active choroidal neovascularization: Secondary | ICD-10-CM

## 2017-12-20 DIAGNOSIS — H353112 Nonexudative age-related macular degeneration, right eye, intermediate dry stage: Secondary | ICD-10-CM

## 2017-12-20 DIAGNOSIS — H43813 Vitreous degeneration, bilateral: Secondary | ICD-10-CM

## 2018-01-17 ENCOUNTER — Ambulatory Visit
Admission: RE | Admit: 2018-01-17 | Discharge: 2018-01-17 | Disposition: A | Discharge status: Home / Self Care | Payer: Medicare Other | Source: Ambulatory Visit | Attending: Internal Medicine | Admitting: Internal Medicine

## 2018-01-17 ENCOUNTER — Encounter (INDEPENDENT_AMBULATORY_CARE_PROVIDER_SITE_OTHER): Payer: Medicare Other | Admitting: Ophthalmology

## 2018-01-17 ENCOUNTER — Other Ambulatory Visit: Payer: Self-pay | Admitting: Internal Medicine

## 2018-01-17 DIAGNOSIS — R059 Cough, unspecified: Secondary | ICD-10-CM

## 2018-01-17 DIAGNOSIS — R05 Cough: Secondary | ICD-10-CM

## 2018-01-21 ENCOUNTER — Encounter (INDEPENDENT_AMBULATORY_CARE_PROVIDER_SITE_OTHER): Payer: Medicare Other | Admitting: Ophthalmology

## 2018-01-21 DIAGNOSIS — H353121 Nonexudative age-related macular degeneration, left eye, early dry stage: Secondary | ICD-10-CM

## 2018-01-21 DIAGNOSIS — I10 Essential (primary) hypertension: Secondary | ICD-10-CM | POA: Diagnosis not present

## 2018-01-21 DIAGNOSIS — H353112 Nonexudative age-related macular degeneration, right eye, intermediate dry stage: Secondary | ICD-10-CM

## 2018-01-21 DIAGNOSIS — H35033 Hypertensive retinopathy, bilateral: Secondary | ICD-10-CM | POA: Diagnosis not present

## 2018-01-21 DIAGNOSIS — H43813 Vitreous degeneration, bilateral: Secondary | ICD-10-CM

## 2018-02-18 ENCOUNTER — Encounter (INDEPENDENT_AMBULATORY_CARE_PROVIDER_SITE_OTHER): Payer: Medicare Other | Admitting: Ophthalmology

## 2018-02-18 DIAGNOSIS — I10 Essential (primary) hypertension: Secondary | ICD-10-CM | POA: Diagnosis not present

## 2018-02-18 DIAGNOSIS — H353221 Exudative age-related macular degeneration, left eye, with active choroidal neovascularization: Secondary | ICD-10-CM

## 2018-02-18 DIAGNOSIS — H43813 Vitreous degeneration, bilateral: Secondary | ICD-10-CM

## 2018-02-18 DIAGNOSIS — H353112 Nonexudative age-related macular degeneration, right eye, intermediate dry stage: Secondary | ICD-10-CM

## 2018-02-18 DIAGNOSIS — H35033 Hypertensive retinopathy, bilateral: Secondary | ICD-10-CM | POA: Diagnosis not present

## 2018-03-21 ENCOUNTER — Encounter (INDEPENDENT_AMBULATORY_CARE_PROVIDER_SITE_OTHER): Payer: Medicare Other | Admitting: Ophthalmology

## 2018-03-21 DIAGNOSIS — H353231 Exudative age-related macular degeneration, bilateral, with active choroidal neovascularization: Secondary | ICD-10-CM

## 2018-03-21 DIAGNOSIS — I10 Essential (primary) hypertension: Secondary | ICD-10-CM

## 2018-03-21 DIAGNOSIS — H43813 Vitreous degeneration, bilateral: Secondary | ICD-10-CM

## 2018-03-21 DIAGNOSIS — H35033 Hypertensive retinopathy, bilateral: Secondary | ICD-10-CM

## 2018-04-18 ENCOUNTER — Encounter (INDEPENDENT_AMBULATORY_CARE_PROVIDER_SITE_OTHER): Payer: Medicare Other | Admitting: Ophthalmology

## 2018-04-18 DIAGNOSIS — H35033 Hypertensive retinopathy, bilateral: Secondary | ICD-10-CM

## 2018-04-18 DIAGNOSIS — I10 Essential (primary) hypertension: Secondary | ICD-10-CM | POA: Diagnosis not present

## 2018-04-18 DIAGNOSIS — H353231 Exudative age-related macular degeneration, bilateral, with active choroidal neovascularization: Secondary | ICD-10-CM

## 2018-04-18 DIAGNOSIS — H43813 Vitreous degeneration, bilateral: Secondary | ICD-10-CM

## 2018-05-16 ENCOUNTER — Encounter (INDEPENDENT_AMBULATORY_CARE_PROVIDER_SITE_OTHER): Payer: Medicare Other | Admitting: Ophthalmology

## 2018-05-16 DIAGNOSIS — H43813 Vitreous degeneration, bilateral: Secondary | ICD-10-CM | POA: Diagnosis not present

## 2018-05-16 DIAGNOSIS — H35033 Hypertensive retinopathy, bilateral: Secondary | ICD-10-CM | POA: Diagnosis not present

## 2018-05-16 DIAGNOSIS — H353231 Exudative age-related macular degeneration, bilateral, with active choroidal neovascularization: Secondary | ICD-10-CM

## 2018-05-16 DIAGNOSIS — I10 Essential (primary) hypertension: Secondary | ICD-10-CM | POA: Diagnosis not present

## 2018-06-12 ENCOUNTER — Encounter (INDEPENDENT_AMBULATORY_CARE_PROVIDER_SITE_OTHER): Payer: Medicare Other | Admitting: Ophthalmology

## 2018-06-12 DIAGNOSIS — I10 Essential (primary) hypertension: Secondary | ICD-10-CM

## 2018-06-12 DIAGNOSIS — H35033 Hypertensive retinopathy, bilateral: Secondary | ICD-10-CM

## 2018-06-12 DIAGNOSIS — H43813 Vitreous degeneration, bilateral: Secondary | ICD-10-CM | POA: Diagnosis not present

## 2018-06-12 DIAGNOSIS — H353231 Exudative age-related macular degeneration, bilateral, with active choroidal neovascularization: Secondary | ICD-10-CM

## 2018-06-14 ENCOUNTER — Ambulatory Visit (HOSPITAL_COMMUNITY)
Admission: RE | Admit: 2018-06-14 | Discharge: 2018-06-14 | Disposition: A | Discharge status: Home / Self Care | Payer: Medicare Other | Source: Ambulatory Visit | Attending: Cardiology | Admitting: Cardiology

## 2018-06-14 DIAGNOSIS — I679 Cerebrovascular disease, unspecified: Secondary | ICD-10-CM | POA: Diagnosis present

## 2018-07-10 ENCOUNTER — Encounter (INDEPENDENT_AMBULATORY_CARE_PROVIDER_SITE_OTHER): Payer: Medicare Other | Admitting: Ophthalmology

## 2018-07-10 DIAGNOSIS — I10 Essential (primary) hypertension: Secondary | ICD-10-CM | POA: Diagnosis not present

## 2018-07-10 DIAGNOSIS — H35033 Hypertensive retinopathy, bilateral: Secondary | ICD-10-CM | POA: Diagnosis not present

## 2018-07-10 DIAGNOSIS — H353231 Exudative age-related macular degeneration, bilateral, with active choroidal neovascularization: Secondary | ICD-10-CM

## 2018-07-10 DIAGNOSIS — H43813 Vitreous degeneration, bilateral: Secondary | ICD-10-CM | POA: Diagnosis not present

## 2018-07-22 NOTE — Progress Notes (Signed)
Garrett Salles, MD Reason for referral-CAD  HPI: 85 year old male for evaluation of coronary artery disease at request of Lorene Dy, MD.  Seen previously but not since October 2016.  Hx of positive stress test leading to cardiac cath in 2010 with diffuse RCA disease of 30-40%, LCX 50%, 1st OM 60-70%, LAD 30-40% 1st diag 60%. EF 50-55%. Echo in 2012 after CVA with EF 55-60%. G1DD.Nuclear study repeated September 2016. Ejection fraction 66%. Findings suggested prior inferior and apical thinning with mild apical ischemia. Patient treated medically.  Carotid Dopplers January 2020 showed 1 to 39% bilateral stenosis.  Patient denies dyspnea on exertion, orthopnea, PND, pedal edema, chest pain or syncope.  He does have some fatigue.  Current Outpatient Medications  Medication Sig Dispense Refill  . aspirin EC 81 MG tablet Take 81 mg by mouth daily.    Marland Kitchen Besifloxacin HCl (BESIVANCE) 0.6 % SUSP Place 1 drop into both eyes See admin instructions. Instil 1 drop into left eye 4 times on the day of eye injection and 4 times on the next day (injections are monthly - last injection 06/17/15    . Butalbital-APAP-Caffeine (FIORICET) 50-300-40 MG CAPS Take 1 tablet by mouth 2 (two) times daily as needed (migraines).     . clopidogrel (PLAVIX) 75 MG tablet Take 75 mg by mouth daily.     Marland Kitchen lisinopril (PRINIVIL,ZESTRIL) 5 MG tablet Take 5 mg by mouth daily at 10 pm.    . meclizine (ANTIVERT) 25 MG tablet Take 25 mg by mouth daily as needed for dizziness.     . metFORMIN (GLUCOPHAGE) 1000 MG tablet Take 1,000 mg by mouth 2 (two) times daily with a meal.      . mirabegron ER (MYRBETRIQ) 50 MG TB24 tablet Take 50 mg by mouth daily.    . Multiple Vitamin (MULTIVITAMIN WITH MINERALS) TABS tablet Take 1 tablet by mouth at bedtime.    . rosuvastatin (CRESTOR) 10 MG tablet Take 5 mg by mouth 3 (three) times a week.     . triamcinolone cream (KENALOG) 0.1 % Apply 1 application topically daily as needed  (rash on groin and/or legs).      No current facility-administered medications for this visit.     Allergies  Allergen Reactions  . Ivp Dye [Iodinated Diagnostic Agents] Shortness Of Breath  . Toviaz [Fesoterodine Fumarate Er] Swelling    Mouth swelling     Past Medical History:  Diagnosis Date  . Arthritis   . Back pain   . Coronary artery disease    takes Plavix daily  . Diabetes mellitus    takes Metformin daily  . GERD (gastroesophageal reflux disease)    takes tagamet daily  . Headache    HX MIGRAINES  . History of blood transfusion    no abnormal reaction noted  . History of colon polyps    benign  . History of migraine    many yrs ago  . Hyperlipidemia    takes Crestor daily  . Hypertension    takes Amlodipine daily  . Joint swelling   . Lung mass 12/01/2014  . Lung mass 12/01/2014  . Macular degeneration    wet and on the left;injection 01/20/15  . Nocturia   . Overactive bladder    takes Myrbetriq daily  . Prostate cancer ([place])   . Stroke ([place])   . TIA (transient ischemic attack)   . Urinary frequency   . Urinary urgency   . Vertigo    takes Antivert  daily as needed    Past Surgical History:  Procedure Laterality Date  . CARDIAC CATHETERIZATION    . cataract surgery Bilateral   . colonosopy    . ESOPHAGOGASTRODUODENOSCOPY    . HIP SURGERY Bilateral    pins   . INGUINAL HERNIA REPAIR Left 03/12/2015   Procedure: LAPAROSCOPIC LEFT INGUINAL HERNIA REPAIR WITH MESH;  Surgeon: Ralene Ok, MD;  Location: [facility];  Service: General;  Laterality: Left;  . INSERTION OF MESH Left 03/12/2015   Procedure: INSERTION OF MESH;  Surgeon: Ralene Ok, MD;  Location: [facility];  Service: General;  Laterality: Left;  . PROSTATECTOMY    . THYROIDECTOMY, PARTIAL    . VIDEO ASSISTED THORACOSCOPY (VATS)/WEDGE RESECTION Right 01/26/2015   Procedure: VIDEO ASSISTED THORACOSCOPY (VATS) with Right Lower Lobe Wedge RESECTION and lymph node sampling.;  Surgeon: [name], MD;  Location: [facility];  Service: Thoracic;  Laterality: Right;  Marland Kitchen VIDEO BRONCHOSCOPY N/A 01/26/2015   Procedure: VIDEO BRONCHOSCOPY;  Surgeon: [name], MD;  Location: [facility];  Service: Thoracic;  Laterality: N/A;    Social History   Socioeconomic History  . Marital status: Married    Spouse name: Not on file  . Number of children: Not on file  . Years of education: Not on file  . Highest education level: Not on file  Occupational History  . Not on file  Social Needs  . Financial resource strain: Not on file  . Food insecurity:    Worry: Not on file    Inability: Not on file  . Transportation needs:    Medical: Not on file    Non-medical: Not on file  Tobacco Use  . Smoking status: Former Smoker    Packs/day: 0.50    Years: 30.00    Pack years: 15.00    Last attempt to quit: 06/05/1959    Years since quitting: 59.1  . Smokeless tobacco: Never Used  Substance and Sexual Activity  . Alcohol use: No    Alcohol/week: 0.0 standard drinks  . Drug use: No  . Sexual activity: Not on file  Lifestyle  . Physical activity:    Days per week: Not on file    Minutes per session: Not on file  . Stress: Not on file  Relationships  . Social connections:    Talks on phone: Not on file    Gets together: Not on file    Attends religious service: Not on file    Active member of club or organization: Not on file    Attends meetings of clubs or organizations: Not on file    Relationship status: Not on file  . Intimate partner violence:    Fear of current or ex partner: Not on file    Emotionally abused: Not on file    Physically abused: Not on file    Forced sexual activity: Not on file  Other Topics Concern  . Not on file  Social History Narrative  . Not on file    Family History  Problem Relation Age of Onset  . Hyperlipidemia Other   . Hypertension Other   . Diabetes Other   . Deep vein thrombosis Other   . Stroke Other   . Coronary artery disease Other       ROS: no fevers or chills, productive cough, hemoptysis, dysphasia, odynophagia, melena, hematochezia, dysuria, hematuria, rash, seizure activity, orthopnea, PND, pedal edema, claudication. Remaining systems are negative.  Physical Exam:   Blood pressure 138/64,  pulse 73, height 6' (1.829 m).  General:  Well developed/well nourished in NAD Skin warm/dry Patient not depressed No peripheral clubbing Back-normal HEENT-normal/normal eyelids Neck supple/normal carotid upstroke bilaterally; no bruits; no JVD; no thyromegaly chest - CTA/ normal expansion CV - RRR/normal S1 and S2; no murmurs, rubs or gallops;  PMI nondisplaced Abdomen -NT/ND, no HSM, no mass, + bowel sounds, no bruit 2+ femoral pulses, no bruits Ext-no edema, chords, 2+ DP Neuro-grossly nonfocal  ECG -normal sinus rhythm at a rate of 73, PAC.  Personally reviewed  A/P  1 coronary artery disease-patient has not had any chest pain.  Plan to continue medical therapy with aspirin and statin.  He is also on Plavix for history of TIA.  2 hyperlipidemia-continue statin.  Lipids and liver monitored by primary care.  3 hypertension-patient's blood pressure is controlled.  Plan to continue present medications and follow.  4 carotid artery disease-mild on most recent Dopplers.  Continue medical therapy with aspirin and statin.  Kirk Ruths, MD

## 2018-07-30 ENCOUNTER — Ambulatory Visit (INDEPENDENT_AMBULATORY_CARE_PROVIDER_SITE_OTHER): Payer: Medicare Other | Admitting: Cardiology

## 2018-07-30 ENCOUNTER — Other Ambulatory Visit: Payer: Self-pay

## 2018-07-30 ENCOUNTER — Encounter: Payer: Self-pay | Admitting: Cardiology

## 2018-07-30 VITALS — BP 138/64 | HR 73 | Ht 72.0 in

## 2018-07-30 DIAGNOSIS — E7800 Pure hypercholesterolemia, unspecified: Secondary | ICD-10-CM | POA: Diagnosis not present

## 2018-07-30 DIAGNOSIS — I2510 Atherosclerotic heart disease of native coronary artery without angina pectoris: Secondary | ICD-10-CM

## 2018-07-30 DIAGNOSIS — I10 Essential (primary) hypertension: Secondary | ICD-10-CM | POA: Diagnosis not present

## 2018-07-30 NOTE — Patient Instructions (Signed)

## 2018-08-07 ENCOUNTER — Other Ambulatory Visit: Payer: Self-pay

## 2018-08-07 ENCOUNTER — Encounter (INDEPENDENT_AMBULATORY_CARE_PROVIDER_SITE_OTHER): Payer: Medicare Other | Admitting: Ophthalmology

## 2018-08-07 DIAGNOSIS — I10 Essential (primary) hypertension: Secondary | ICD-10-CM

## 2018-08-07 DIAGNOSIS — H43813 Vitreous degeneration, bilateral: Secondary | ICD-10-CM

## 2018-08-07 DIAGNOSIS — H353231 Exudative age-related macular degeneration, bilateral, with active choroidal neovascularization: Secondary | ICD-10-CM | POA: Diagnosis not present

## 2018-08-07 DIAGNOSIS — H35033 Hypertensive retinopathy, bilateral: Secondary | ICD-10-CM

## 2018-09-05 ENCOUNTER — Other Ambulatory Visit: Payer: Self-pay

## 2018-09-05 ENCOUNTER — Encounter (INDEPENDENT_AMBULATORY_CARE_PROVIDER_SITE_OTHER): Payer: Medicare Other | Admitting: Ophthalmology

## 2018-09-05 DIAGNOSIS — H353231 Exudative age-related macular degeneration, bilateral, with active choroidal neovascularization: Secondary | ICD-10-CM | POA: Diagnosis not present

## 2018-09-05 DIAGNOSIS — H43813 Vitreous degeneration, bilateral: Secondary | ICD-10-CM | POA: Diagnosis not present

## 2018-09-05 DIAGNOSIS — I10 Essential (primary) hypertension: Secondary | ICD-10-CM | POA: Diagnosis not present

## 2018-09-05 DIAGNOSIS — H35033 Hypertensive retinopathy, bilateral: Secondary | ICD-10-CM | POA: Diagnosis not present

## 2018-10-03 ENCOUNTER — Other Ambulatory Visit: Payer: Self-pay

## 2018-10-03 ENCOUNTER — Encounter (INDEPENDENT_AMBULATORY_CARE_PROVIDER_SITE_OTHER): Payer: Medicare Other | Admitting: Ophthalmology

## 2018-10-03 DIAGNOSIS — H35033 Hypertensive retinopathy, bilateral: Secondary | ICD-10-CM | POA: Diagnosis not present

## 2018-10-03 DIAGNOSIS — H353231 Exudative age-related macular degeneration, bilateral, with active choroidal neovascularization: Secondary | ICD-10-CM | POA: Diagnosis not present

## 2018-10-03 DIAGNOSIS — I10 Essential (primary) hypertension: Secondary | ICD-10-CM

## 2018-10-03 DIAGNOSIS — H43813 Vitreous degeneration, bilateral: Secondary | ICD-10-CM | POA: Diagnosis not present

## 2018-10-31 ENCOUNTER — Other Ambulatory Visit: Payer: Self-pay

## 2018-10-31 ENCOUNTER — Encounter (INDEPENDENT_AMBULATORY_CARE_PROVIDER_SITE_OTHER): Payer: Medicare Other | Admitting: Ophthalmology

## 2018-10-31 DIAGNOSIS — I10 Essential (primary) hypertension: Secondary | ICD-10-CM | POA: Diagnosis not present

## 2018-10-31 DIAGNOSIS — H353231 Exudative age-related macular degeneration, bilateral, with active choroidal neovascularization: Secondary | ICD-10-CM

## 2018-10-31 DIAGNOSIS — H4313 Vitreous hemorrhage, bilateral: Secondary | ICD-10-CM

## 2018-10-31 DIAGNOSIS — H35033 Hypertensive retinopathy, bilateral: Secondary | ICD-10-CM | POA: Diagnosis not present

## 2018-11-28 ENCOUNTER — Encounter (INDEPENDENT_AMBULATORY_CARE_PROVIDER_SITE_OTHER): Payer: Medicare Other | Admitting: Ophthalmology

## 2018-11-28 ENCOUNTER — Other Ambulatory Visit: Payer: Self-pay

## 2018-11-28 DIAGNOSIS — I10 Essential (primary) hypertension: Secondary | ICD-10-CM

## 2018-11-28 DIAGNOSIS — H353231 Exudative age-related macular degeneration, bilateral, with active choroidal neovascularization: Secondary | ICD-10-CM

## 2018-11-28 DIAGNOSIS — H43813 Vitreous degeneration, bilateral: Secondary | ICD-10-CM | POA: Diagnosis not present

## 2018-11-28 DIAGNOSIS — H35033 Hypertensive retinopathy, bilateral: Secondary | ICD-10-CM | POA: Diagnosis not present

## 2018-12-26 ENCOUNTER — Encounter (INDEPENDENT_AMBULATORY_CARE_PROVIDER_SITE_OTHER): Payer: Medicare Other | Admitting: Ophthalmology

## 2018-12-26 ENCOUNTER — Other Ambulatory Visit: Payer: Self-pay

## 2018-12-26 DIAGNOSIS — H353231 Exudative age-related macular degeneration, bilateral, with active choroidal neovascularization: Secondary | ICD-10-CM | POA: Diagnosis not present

## 2018-12-26 DIAGNOSIS — I10 Essential (primary) hypertension: Secondary | ICD-10-CM

## 2018-12-26 DIAGNOSIS — H43813 Vitreous degeneration, bilateral: Secondary | ICD-10-CM | POA: Diagnosis not present

## 2018-12-26 DIAGNOSIS — H35033 Hypertensive retinopathy, bilateral: Secondary | ICD-10-CM | POA: Diagnosis not present

## 2018-12-31 ENCOUNTER — Emergency Department (HOSPITAL_BASED_OUTPATIENT_CLINIC_OR_DEPARTMENT_OTHER)
Admission: EM | Admit: 2018-12-31 | Discharge: 2018-12-31 | Disposition: A | Discharge status: Home / Self Care | Payer: Medicare Other | Attending: Emergency Medicine | Admitting: Emergency Medicine

## 2018-12-31 ENCOUNTER — Other Ambulatory Visit: Payer: Self-pay

## 2018-12-31 ENCOUNTER — Encounter (HOSPITAL_BASED_OUTPATIENT_CLINIC_OR_DEPARTMENT_OTHER): Payer: Self-pay | Admitting: Emergency Medicine

## 2018-12-31 ENCOUNTER — Emergency Department (HOSPITAL_BASED_OUTPATIENT_CLINIC_OR_DEPARTMENT_OTHER): Payer: Medicare Other

## 2018-12-31 DIAGNOSIS — Z7902 Long term (current) use of antithrombotics/antiplatelets: Secondary | ICD-10-CM | POA: Diagnosis not present

## 2018-12-31 DIAGNOSIS — Y93H2 Activity, gardening and landscaping: Secondary | ICD-10-CM | POA: Diagnosis not present

## 2018-12-31 DIAGNOSIS — Z7984 Long term (current) use of oral hypoglycemic drugs: Secondary | ICD-10-CM | POA: Diagnosis not present

## 2018-12-31 DIAGNOSIS — Z8673 Personal history of transient ischemic attack (TIA), and cerebral infarction without residual deficits: Secondary | ICD-10-CM | POA: Insufficient documentation

## 2018-12-31 DIAGNOSIS — I10 Essential (primary) hypertension: Secondary | ICD-10-CM | POA: Diagnosis not present

## 2018-12-31 DIAGNOSIS — R0789 Other chest pain: Secondary | ICD-10-CM | POA: Diagnosis not present

## 2018-12-31 DIAGNOSIS — R109 Unspecified abdominal pain: Secondary | ICD-10-CM | POA: Diagnosis not present

## 2018-12-31 DIAGNOSIS — S61412A Laceration without foreign body of left hand, initial encounter: Secondary | ICD-10-CM | POA: Diagnosis present

## 2018-12-31 DIAGNOSIS — Y92007 Garden or yard of unspecified non-institutional (private) residence as the place of occurrence of the external cause: Secondary | ICD-10-CM | POA: Diagnosis not present

## 2018-12-31 DIAGNOSIS — S6992XA Unspecified injury of left wrist, hand and finger(s), initial encounter: Secondary | ICD-10-CM

## 2018-12-31 DIAGNOSIS — Z7982 Long term (current) use of aspirin: Secondary | ICD-10-CM | POA: Insufficient documentation

## 2018-12-31 DIAGNOSIS — I2510 Atherosclerotic heart disease of native coronary artery without angina pectoris: Secondary | ICD-10-CM | POA: Diagnosis not present

## 2018-12-31 DIAGNOSIS — Y999 Unspecified external cause status: Secondary | ICD-10-CM | POA: Insufficient documentation

## 2018-12-31 DIAGNOSIS — E119 Type 2 diabetes mellitus without complications: Secondary | ICD-10-CM | POA: Diagnosis not present

## 2018-12-31 DIAGNOSIS — E785 Hyperlipidemia, unspecified: Secondary | ICD-10-CM | POA: Insufficient documentation

## 2018-12-31 MED ORDER — DICLOFENAC SODIUM 1 % TD GEL: 4.0000 g | Freq: Four times a day (QID) | TRANSDERMAL | 1 refills | Status: AC

## 2018-12-31 NOTE — Discharge Instructions (Addendum)
Rib injuries  You have sustained a rib injury.  There were no acute abnormalities noted on the x-ray, including no sign of fractures, although there can be fractures that do not show up on the x-ray, called occult fractures.  Please adhere to the following instructions: Incentive spirometer: This device is used to ensure proper expansion of the lungs and to help prevent secondary issues, such as pneumonia.  Think of this as physical therapy for your lungs while you are injured.  Perform lung expansion exercises every 1-2 hours while awake.  Have an initial goal of 1000 mL and then work to increase this value.  Acetaminophen: May take acetaminophen (generic for Tylenol), as needed, for pain. Your daily total maximum amount of acetaminophen from all sources should be limited to 4000mg /day for persons without liver problems, or 2000mg /day for those with liver problems.  Diclofenac gel: This is a topical anti-inflammatory medication and can be applied directly to the painful region.  Do not use on the face or genitals.  This medication may be used as an alternative to oral anti-inflammatory medications, such as ibuprofen or naproxen.  Other pain management: Many parts of pain management involve experimentation to find what works for you as an individual patient.  You may try lidocaine patches, topical pain relievers, or hot/cold packs.  You may also need to change your sleeping position.  This may involve sleeping propped up in a chair or with extra pillows.  Duration of pain: For bruising or contusions to the ribs, pain can last 4-6 weeks.  For rib fractures, you can expect to have discomfort for 6-12 weeks.  It should be noted that even if there are no obvious fractures on the x-rays, you may have what is called an occult fracture.  This simply means that the bone is broken, but is not broken enough to be noted on x-ray.  Follow-up: Please follow-up with a primary care provider for any further  management of this issue.  Any further pain management should also be handled by a primary care provider.  Return: Return to the ED should you begin to have significantly worsening pain, onset of shortness of breath (not just hesitancy to take a deep breath due to pain), fever over 100.3 F accompanied by cough, coughing up blood, or any other major concerns.  For prescription assistance, may try using prescription discount sites or apps, such as goodrx.com

## 2018-12-31 NOTE — ED Triage Notes (Signed)
Cut left hand on lawn mower today around 1430, has a dressing dry and intact to area. Also having pain to left rib area, no LOC

## 2018-12-31 NOTE — ED Notes (Signed)
Irrigated left hand wound with Sterile water. Tolerated well. Bleeding controlled.

## 2018-12-31 NOTE — ED Notes (Signed)
Patient transported to X-ray 

## 2018-12-31 NOTE — ED Provider Notes (Signed)
[facility] EMERGENCY DEPARTMENT Provider Note   CSN: [id number] Arrival date & time: 12/31/18  1706    History   Chief Complaint Chief Complaint  Patient presents with  . Laceration    HPI Garrett Henson is a 85 y.o. male.     HPI  Garrett Henson is a 85 y.o. male, with a history of GERD, CAD on Plavix, DM, HTN, presenting to the ED with injuries from a fall that occurred around 2:30 PM today.  Patient states he was riding a lawnmower, was bumped off while on a hill, and fell to the side, striking his left ribs and his left hand on an unknown object. He complains of a laceration to the left dorsal hand as well as pain to the left ribs. His pain to the left ribs occurs mostly with movement or palpation, Moderate, nonradiating.  Denies head injury, LOC, nausea/vomiting, neck/back pain, shortness of breath, central chest pain, abdominal pain, numbness, weakness, or any other complaints.  Past Medical History:  Diagnosis Date  . Arthritis   . Back pain   . Coronary artery disease    takes Plavix daily  . Diabetes mellitus    takes Metformin daily  . GERD (gastroesophageal reflux disease)    takes tagamet daily  . Headache    HX MIGRAINES  . History of blood transfusion    no abnormal reaction noted  . History of colon polyps    benign  . History of migraine    many yrs ago  . Hyperlipidemia    takes Crestor daily  . Hypertension    takes Amlodipine daily  . Joint swelling   . Lung mass 12/01/2014  . Lung mass 12/01/2014  . Macular degeneration    wet and on the left;injection 01/20/15  . Nocturia   . Overactive bladder    takes Myrbetriq daily  . Prostate cancer ([place])   . Stroke ([place])   . TIA (transient ischemic attack)   . Urinary frequency   . Urinary urgency   . Vertigo    takes Antivert daily as needed    Patient Active Problem List   Diagnosis Date Noted  . Preop cardiovascular exam 03/02/2015  . Cerebrovascular disease 03/02/2015   . Hyperlipidemia 03/02/2015  . Lung cancer ([place]) 01/26/2015  . DM2 (diabetes mellitus, type 2) ([place]) 04/23/2011  . CAD (coronary artery disease) 04/23/2011  . Stroke ([place]) 04/22/2011  . Chest pain 04/22/2011  . Hypertension 04/22/2011    Past Surgical History:  Procedure Laterality Date  . CARDIAC CATHETERIZATION    . cataract surgery Bilateral   . colonosopy    . ESOPHAGOGASTRODUODENOSCOPY    . HIP SURGERY Bilateral    pins   . INGUINAL HERNIA REPAIR Left 03/12/2015   Procedure: LAPAROSCOPIC LEFT INGUINAL HERNIA REPAIR WITH MESH;  Surgeon: Ralene Ok, MD;  Location: [facility];  Service: General;  Laterality: Left;  . INSERTION OF MESH Left 03/12/2015   Procedure: INSERTION OF MESH;  Surgeon: Ralene Ok, MD;  Location: [facility];  Service: General;  Laterality: Left;  . PROSTATECTOMY    . THYROIDECTOMY, PARTIAL    . VIDEO ASSISTED THORACOSCOPY (VATS)/WEDGE RESECTION Right 01/26/2015   Procedure: VIDEO ASSISTED THORACOSCOPY (VATS) with Right Lower Lobe Wedge RESECTION and lymph node sampling.;  Surgeon: [name], MD;  Location: [facility];  Service: Thoracic;  Laterality: Right;  Marland Kitchen VIDEO BRONCHOSCOPY N/A 01/26/2015   Procedure: VIDEO BRONCHOSCOPY;  Surgeon: [name], MD;  Location:  [facility] OR;  Service: Thoracic;  Laterality: N/A;        Home Medications    Prior to Admission medications   Medication Sig Start Date End Date Taking? Authorizing Provider  aspirin EC 81 MG tablet Take 81 mg by mouth daily.   Yes [provider]  Besifloxacin HCl (BESIVANCE) 0.6 % SUSP Place 1 drop into both eyes See admin instructions. Instil 1 drop into left eye 4 times on the day of eye injection and 4 times on the next day (injections are monthly - last injection 06/17/15   Yes [provider]  Butalbital-APAP-Caffeine (FIORICET) 50-300-40 MG CAPS Take 1 tablet by mouth 2 (two) times daily as needed (migraines).    Yes [provider]  clopidogrel (PLAVIX) 75 MG  tablet Take 75 mg by mouth daily.    Yes [provider]  lisinopril (PRINIVIL,ZESTRIL) 5 MG tablet Take 5 mg by mouth daily at 10 pm.   Yes [provider]  meclizine (ANTIVERT) 25 MG tablet Take 25 mg by mouth daily as needed for dizziness.    Yes [provider]  metFORMIN (GLUCOPHAGE) 1000 MG tablet Take 1,000 mg by mouth 2 (two) times daily with a meal.     Yes [provider]  rosuvastatin (CRESTOR) 10 MG tablet Take 5 mg by mouth 3 (three) times a week.  11/03/14  Yes [provider]  diclofenac sodium (VOLTAREN) 1 % GEL Apply 4 g topically 4 (four) times daily. 12/31/18   Brynlei Klausner C, PA-C  mirabegron ER (MYRBETRIQ) 50 MG TB24 tablet Take 50 mg by mouth daily.    [provider]  Multiple Vitamin (MULTIVITAMIN WITH MINERALS) TABS tablet Take 1 tablet by mouth at bedtime.    [provider]  triamcinolone cream (KENALOG) 0.1 % Apply 1 application topically daily as needed (rash on groin and/or legs).  11/30/14   [provider]    Family History Family History  Problem Relation Age of Onset  . Hyperlipidemia Other   . Hypertension Other   . Diabetes Other   . Deep vein thrombosis Other   . Stroke Other   . Coronary artery disease Other     Social History Social History   Tobacco Use  . Smoking status: Former Smoker    Packs/day: 0.50    Years: 30.00    Pack years: 15.00    Quit date: 06/05/1959    Years since quitting: 59.6  . Smokeless tobacco: Never Used  Substance Use Topics  . Alcohol use: No    Alcohol/week: 0.0 standard drinks  . Drug use: No     Allergies   Ivp dye [iodinated diagnostic agents] and Toviaz [fesoterodine fumarate er]   Review of Systems Review of Systems  Respiratory: Negative for shortness of breath.   Gastrointestinal: Negative for abdominal pain, nausea and vomiting.  Musculoskeletal: Positive for arthralgias. Negative for back pain and neck pain.       Left rib pain  and left hand pain  Skin: Positive for wound.  Neurological: Negative for dizziness, syncope, weakness, light-headedness, numbness and headaches.  All other systems reviewed and are negative.    Physical Exam Updated Vital Signs BP (!) 177/88 (BP Location: Right Arm)   Pulse 72   Temp 98 F (36.7 C) (Oral)   Resp 16   Ht 6' (1.829 m)   Wt 74.4 kg   SpO2 98%   BMI 22.24 kg/m   Physical Exam Vitals signs and nursing  note reviewed.  Constitutional:      General: He is not in acute distress.    Appearance: He is well-developed. He is not diaphoretic.  HENT:     Head: Normocephalic and atraumatic.     Mouth/Throat:     Mouth: Mucous membranes are moist.     Pharynx: Oropharynx is clear.  Eyes:     Conjunctiva/sclera: Conjunctivae normal.  Neck:     Musculoskeletal: Neck supple.  Cardiovascular:     Rate and Rhythm: Normal rate and regular rhythm.     Pulses: Normal pulses.          Radial pulses are 2+ on the right side and 2+ on the left side.       Posterior tibial pulses are 2+ on the right side and 2+ on the left side.     Heart sounds: Normal heart sounds.     Comments: Tactile temperature in the extremities appropriate and equal bilaterally. Pulmonary:     Effort: Pulmonary effort is normal. No respiratory distress.     Breath sounds: Normal breath sounds.  Chest:     Chest wall: Tenderness present. No deformity, swelling or crepitus.       Comments: Tenderness of the left lateral ribs without noted bruising, erythema, swelling, deformity, or instability. Abdominal:     Palpations: Abdomen is soft.     Tenderness: There is abdominal tenderness. There is no guarding.       Comments: Most of the patient's tenderness is limited to the left lateral ribs, however, some tenderness extends to left upper abdomen and left flank.  No bruising or color change.  Musculoskeletal:     Right lower leg: No edema.     Left lower leg: No edema.     Comments: Tenderness to the  left dorsal hand with noted skin tear, as shown.  Full range of motion in the fingers of the left hand without pain or noted difficulty. No pain, tenderness, swelling, or other abnormality to the left wrist, elbow, or shoulder. Normal motor function intact in all extremities. No midline spinal tenderness.   Lymphadenopathy:     Cervical: No cervical adenopathy.  Skin:    General: Skin is warm and dry.  Neurological:     Mental Status: He is alert.     Comments: Sensation to light touch grossly intact in the left hand and upper extremity. Strength 5/5 in the left upper extremity.  Psychiatric:        Mood and Affect: Mood and affect normal.        Speech: Speech normal.        Behavior: Behavior normal.        ED Treatments / Results  Labs (all labs ordered are listed, but only abnormal results are displayed) Labs Reviewed - No data to display  EKG None  Radiology Dg Hand Complete Left  Result Date: 12/31/2018 CLINICAL DATA:  Cut left hand on low more. EXAM: LEFT HAND - COMPLETE 3+ VIEW COMPARISON:  None. FINDINGS: There is no acute displaced fracture or dislocation. There is soft tissue swelling about the hand. No radiopaque foreign body. There are degenerative changes of the interphalangeal joints and wrist. IMPRESSION: No acute displaced fracture or dislocation. Electronically Signed   By: Constance Holster M.D.   On: 12/31/2018 18:54            Procedures Procedures (including critical care time)  Medications Ordered in ED Medications - No data to display  Initial Impression / Assessment and Plan / ED Course  I have reviewed the triage vital signs and the nursing notes.  Pertinent labs & imaging results that were available during my care of the patient were reviewed by me and considered in my medical decision making (see chart for details).  Clinical Course as of Dec 30 2201  Tue Dec 30, 2308  [id number] 85 year old male who got injured when falling off a  lawnmower striking the left side of his chest and his left hand.  Is here for evaluation of a hand laceration but also has a good amount of tenderness in his left upper quadrant and left upper chest.  Skin a chest x-ray and hand x-ray and a CT abdomen and pelvis plus minus chest depending on the results of the chest x-ray.   [MB]    Clinical Course User Index [MB] Hayden Rasmussen, MD       Patient presents following a fall with injury to the left chest and left hand.  Left hand shows a skin tear with no ability for suturing or other wound closure.  No acute abnormality on hand x-ray. No acute abnormalities on chest/rib x-ray or CT of the abdomen/pelvis.  CT had to be performed without IV contrast due to patient's allergy.  The limitations of such a scan were discussed with the patient.  We were able to observe the patient here in the ED.  There was a difference in the patient's blood pressure during his time in the ED, however, I suspect this may be more due to the patient experiencing some pain relief and decrease in nervousness, as he was much more relaxed by the end of the visit.  He did not have any diaphoresis, pallor, changes in mental status, changes in complaint, or changes on serial exams. Patient was provided with a spirometer and instructions for use. The patient was given instructions for home care as well as strict return precautions. Patient voices understanding of these instructions, accepts the plan, and is comfortable with discharge.    Findings and plan of care discussed with Ronnald Ramp, MD. Dr. Melina Copa personally evaluated and examined this patient.  Vitals:   12/31/18 1726 12/31/18 1727 12/31/18 1857 12/31/18 2103  BP: (!) 177/88  (!) 177/75 122/70  Pulse: 72  60 70  Resp: 16  20 18   Temp: 98 F (36.7 C)     TempSrc: Oral     SpO2: 98%  100% 100%  Weight:  74.4 kg    Height:  6' (1.829 m)       Final Clinical Impressions(s) / ED Diagnoses   Final diagnoses:   Injury of left hand, initial encounter  Skin tear of left hand without complication, initial encounter    ED Discharge Orders         Ordered    diclofenac sodium (VOLTAREN) 1 % GEL  4 times daily     12/31/18 2124           Layla Maw 12/31/18 2213    Hayden Rasmussen, MD 01/01/19 [phone]

## 2019-01-23 ENCOUNTER — Other Ambulatory Visit: Payer: Self-pay

## 2019-01-23 ENCOUNTER — Encounter (INDEPENDENT_AMBULATORY_CARE_PROVIDER_SITE_OTHER): Payer: Medicare Other | Admitting: Ophthalmology

## 2019-01-23 DIAGNOSIS — H353231 Exudative age-related macular degeneration, bilateral, with active choroidal neovascularization: Secondary | ICD-10-CM

## 2019-01-23 DIAGNOSIS — H35033 Hypertensive retinopathy, bilateral: Secondary | ICD-10-CM

## 2019-01-23 DIAGNOSIS — I10 Essential (primary) hypertension: Secondary | ICD-10-CM | POA: Diagnosis not present

## 2019-01-23 DIAGNOSIS — H43813 Vitreous degeneration, bilateral: Secondary | ICD-10-CM

## 2019-02-05 ENCOUNTER — Emergency Department (HOSPITAL_COMMUNITY): Payer: Medicare Other

## 2019-02-05 ENCOUNTER — Inpatient Hospital Stay (HOSPITAL_COMMUNITY)
Admission: EM | Admit: 2019-02-05 | Discharge: 2019-03-16 | DRG: 091 | Disposition: E | Payer: Medicare Other | Attending: Emergency Medicine | Admitting: Emergency Medicine

## 2019-02-05 DIAGNOSIS — J9601 Acute respiratory failure with hypoxia: Secondary | ICD-10-CM | POA: Diagnosis present

## 2019-02-05 DIAGNOSIS — I10 Essential (primary) hypertension: Secondary | ICD-10-CM | POA: Diagnosis present

## 2019-02-05 DIAGNOSIS — Z791 Long term (current) use of non-steroidal anti-inflammatories (NSAID): Secondary | ICD-10-CM

## 2019-02-05 DIAGNOSIS — I6389 Other cerebral infarction: Secondary | ICD-10-CM | POA: Diagnosis not present

## 2019-02-05 DIAGNOSIS — Z8673 Personal history of transient ischemic attack (TIA), and cerebral infarction without residual deficits: Secondary | ICD-10-CM | POA: Diagnosis not present

## 2019-02-05 DIAGNOSIS — Z66 Do not resuscitate: Secondary | ICD-10-CM | POA: Diagnosis not present

## 2019-02-05 DIAGNOSIS — R402432 Glasgow coma scale score 3-8, at arrival to emergency department: Secondary | ICD-10-CM | POA: Diagnosis present

## 2019-02-05 DIAGNOSIS — Z9079 Acquired absence of other genital organ(s): Secondary | ICD-10-CM

## 2019-02-05 DIAGNOSIS — E1151 Type 2 diabetes mellitus with diabetic peripheral angiopathy without gangrene: Secondary | ICD-10-CM | POA: Diagnosis present

## 2019-02-05 DIAGNOSIS — C3431 Malignant neoplasm of lower lobe, right bronchus or lung: Secondary | ICD-10-CM | POA: Diagnosis present

## 2019-02-05 DIAGNOSIS — Z7984 Long term (current) use of oral hypoglycemic drugs: Secondary | ICD-10-CM

## 2019-02-05 DIAGNOSIS — R1310 Dysphagia, unspecified: Secondary | ICD-10-CM | POA: Diagnosis present

## 2019-02-05 DIAGNOSIS — R4182 Altered mental status, unspecified: Secondary | ICD-10-CM | POA: Diagnosis present

## 2019-02-05 DIAGNOSIS — G936 Cerebral edema: Secondary | ICD-10-CM | POA: Diagnosis present

## 2019-02-05 DIAGNOSIS — N170 Acute kidney failure with tubular necrosis: Secondary | ICD-10-CM | POA: Diagnosis present

## 2019-02-05 DIAGNOSIS — Z7189 Other specified counseling: Secondary | ICD-10-CM

## 2019-02-05 DIAGNOSIS — E785 Hyperlipidemia, unspecified: Secondary | ICD-10-CM | POA: Diagnosis present

## 2019-02-05 DIAGNOSIS — R0689 Other abnormalities of breathing: Secondary | ICD-10-CM | POA: Diagnosis not present

## 2019-02-05 DIAGNOSIS — G9349 Other encephalopathy: Secondary | ICD-10-CM | POA: Diagnosis present

## 2019-02-05 DIAGNOSIS — I82511 Chronic embolism and thrombosis of right femoral vein: Secondary | ICD-10-CM | POA: Diagnosis present

## 2019-02-05 DIAGNOSIS — I2510 Atherosclerotic heart disease of native coronary artery without angina pectoris: Secondary | ICD-10-CM | POA: Diagnosis present

## 2019-02-05 DIAGNOSIS — R7989 Other specified abnormal findings of blood chemistry: Secondary | ICD-10-CM | POA: Diagnosis not present

## 2019-02-05 DIAGNOSIS — N3281 Overactive bladder: Secondary | ICD-10-CM | POA: Diagnosis present

## 2019-02-05 DIAGNOSIS — Z978 Presence of other specified devices: Secondary | ICD-10-CM | POA: Insufficient documentation

## 2019-02-05 DIAGNOSIS — I248 Other forms of acute ischemic heart disease: Secondary | ICD-10-CM | POA: Diagnosis present

## 2019-02-05 DIAGNOSIS — Z8546 Personal history of malignant neoplasm of prostate: Secondary | ICD-10-CM

## 2019-02-05 DIAGNOSIS — Z20828 Contact with and (suspected) exposure to other viral communicable diseases: Secondary | ICD-10-CM | POA: Diagnosis present

## 2019-02-05 DIAGNOSIS — G9340 Encephalopathy, unspecified: Secondary | ICD-10-CM | POA: Diagnosis not present

## 2019-02-05 DIAGNOSIS — Z833 Family history of diabetes mellitus: Secondary | ICD-10-CM

## 2019-02-05 DIAGNOSIS — J9620 Acute and chronic respiratory failure, unspecified whether with hypoxia or hypercapnia: Secondary | ICD-10-CM | POA: Diagnosis not present

## 2019-02-05 DIAGNOSIS — Z7952 Long term (current) use of systemic steroids: Secondary | ICD-10-CM

## 2019-02-05 DIAGNOSIS — E1165 Type 2 diabetes mellitus with hyperglycemia: Secondary | ICD-10-CM | POA: Diagnosis present

## 2019-02-05 DIAGNOSIS — G931 Anoxic brain damage, not elsewhere classified: Principal | ICD-10-CM | POA: Diagnosis present

## 2019-02-05 DIAGNOSIS — Z7902 Long term (current) use of antithrombotics/antiplatelets: Secondary | ICD-10-CM | POA: Diagnosis not present

## 2019-02-05 DIAGNOSIS — D649 Anemia, unspecified: Secondary | ICD-10-CM | POA: Diagnosis present

## 2019-02-05 DIAGNOSIS — D72829 Elevated white blood cell count, unspecified: Secondary | ICD-10-CM | POA: Diagnosis not present

## 2019-02-05 DIAGNOSIS — Z85118 Personal history of other malignant neoplasm of bronchus and lung: Secondary | ICD-10-CM | POA: Diagnosis not present

## 2019-02-05 DIAGNOSIS — Z91041 Radiographic dye allergy status: Secondary | ICD-10-CM

## 2019-02-05 DIAGNOSIS — E11649 Type 2 diabetes mellitus with hypoglycemia without coma: Secondary | ICD-10-CM | POA: Diagnosis not present

## 2019-02-05 DIAGNOSIS — Z7982 Long term (current) use of aspirin: Secondary | ICD-10-CM | POA: Diagnosis not present

## 2019-02-05 DIAGNOSIS — K279 Peptic ulcer, site unspecified, unspecified as acute or chronic, without hemorrhage or perforation: Secondary | ICD-10-CM | POA: Diagnosis not present

## 2019-02-05 DIAGNOSIS — Z79899 Other long term (current) drug therapy: Secondary | ICD-10-CM

## 2019-02-05 DIAGNOSIS — H3530 Unspecified macular degeneration: Secondary | ICD-10-CM | POA: Diagnosis present

## 2019-02-05 DIAGNOSIS — I639 Cerebral infarction, unspecified: Secondary | ICD-10-CM | POA: Diagnosis present

## 2019-02-05 DIAGNOSIS — N179 Acute kidney failure, unspecified: Secondary | ICD-10-CM | POA: Diagnosis not present

## 2019-02-05 DIAGNOSIS — Z515 Encounter for palliative care: Secondary | ICD-10-CM | POA: Diagnosis not present

## 2019-02-05 LAB — POCT I-STAT 7, (LYTES, BLD GAS, ICA,H+H)
Acid-base deficit: 2 mmol/L (ref 0.0–2.0)
Bicarbonate: 23.1 mmol/L (ref 20.0–28.0)
Calcium, Ion: 1.19 mmol/L (ref 1.15–1.40)
HCT: 33 % — ABNORMAL LOW (ref 39.0–52.0)
Hemoglobin: 11.2 g/dL — ABNORMAL LOW (ref 13.0–17.0)
O2 Saturation: 100 %
Patient temperature: 97.2
Potassium: 3.9 mmol/L (ref 3.5–5.1)
Sodium: 136 mmol/L (ref 135–145)
TCO2: 24 mmol/L (ref 22–32)
pCO2 arterial: 38.4 mmHg (ref 32.0–48.0)
pH, Arterial: 7.384 (ref 7.350–7.450)
pO2, Arterial: 443 mmHg — ABNORMAL HIGH (ref 83.0–108.0)

## 2019-02-05 LAB — URINALYSIS, ROUTINE W REFLEX MICROSCOPIC
Bilirubin Urine: NEGATIVE
Glucose, UA: NEGATIVE mg/dL
Hgb urine dipstick: NEGATIVE
Ketones, ur: 5 mg/dL — AB
Leukocytes,Ua: NEGATIVE
Nitrite: NEGATIVE
Protein, ur: >=300 mg/dL — AB
Specific Gravity, Urine: 1.014 (ref 1.005–1.030)
pH: 5 (ref 5.0–8.0)

## 2019-02-05 LAB — COMPREHENSIVE METABOLIC PANEL
ALT: 11 U/L (ref 0–44)
AST: 24 U/L (ref 15–41)
Albumin: 3.6 g/dL (ref 3.5–5.0)
Alkaline Phosphatase: 73 U/L (ref 38–126)
Anion gap: 13 (ref 5–15)
BUN: 22 mg/dL (ref 8–23)
CO2: 20 mmol/L — ABNORMAL LOW (ref 22–32)
Calcium: 8.8 mg/dL — ABNORMAL LOW (ref 8.9–10.3)
Chloride: 103 mmol/L (ref 98–111)
Creatinine, Ser: 1.15 mg/dL (ref 0.61–1.24)
GFR calc Af Amer: >60 mL/min (ref >60)
GFR calc non Af Amer: 55 mL/min — ABNORMAL LOW (ref >60)
Glucose, Bld: 188 mg/dL — ABNORMAL HIGH (ref 70–99)
Potassium: 4 mmol/L (ref 3.5–5.1)
Sodium: 136 mmol/L (ref 135–145)
Total Bilirubin: 0.3 mg/dL (ref 0.3–1.2)
Total Protein: 6.7 g/dL (ref 6.5–8.1)

## 2019-02-05 LAB — PROTIME-INR
INR: 1 (ref 0.8–1.2)
Prothrombin Time: 13.3 seconds (ref 11.4–15.2)

## 2019-02-05 LAB — CBC
HCT: 37.5 % — ABNORMAL LOW (ref 39.0–52.0)
Hemoglobin: 11.9 g/dL — ABNORMAL LOW (ref 13.0–17.0)
MCH: 26.8 pg (ref 26.0–34.0)
MCHC: 31.7 g/dL (ref 30.0–36.0)
MCV: 84.5 fL (ref 80.0–100.0)
Platelets: 204 10*3/uL (ref 150–400)
RBC: 4.44 MIL/uL (ref 4.22–5.81)
RDW: 14 % (ref 11.5–15.5)
WBC: 8.3 10*3/uL (ref 4.0–10.5)
nRBC: 0 % (ref 0.0–0.2)

## 2019-02-05 LAB — I-STAT CHEM 8, ED
BUN: 24 mg/dL — ABNORMAL HIGH (ref 8–23)
Calcium, Ion: 1.1 mmol/L — ABNORMAL LOW (ref 1.15–1.40)
Chloride: 104 mmol/L (ref 98–111)
Creatinine, Ser: 1 mg/dL (ref 0.61–1.24)
Glucose, Bld: 191 mg/dL — ABNORMAL HIGH (ref 70–99)
HCT: 37 % — ABNORMAL LOW (ref 39.0–52.0)
Hemoglobin: 12.6 g/dL — ABNORMAL LOW (ref 13.0–17.0)
Potassium: 3.9 mmol/L (ref 3.5–5.1)
Sodium: 136 mmol/L (ref 135–145)
TCO2: 21 mmol/L — ABNORMAL LOW (ref 22–32)

## 2019-02-05 LAB — ETHANOL: Alcohol, Ethyl (B): <10 mg/dL (ref <10)

## 2019-02-05 LAB — CBG MONITORING, ED
Glucose-Capillary: 168 mg/dL — ABNORMAL HIGH (ref 70–99)
Glucose-Capillary: 168 mg/dL — ABNORMAL HIGH (ref 70–99)
Glucose-Capillary: 207 mg/dL — ABNORMAL HIGH (ref 70–99)

## 2019-02-05 LAB — MAGNESIUM: Magnesium: 2 mg/dL (ref 1.7–2.4)

## 2019-02-05 LAB — SAMPLE TO BLOOD BANK

## 2019-02-05 LAB — LACTIC ACID, PLASMA
Lactic Acid, Venous: 2 mmol/L (ref 0.5–1.9)
Lactic Acid, Venous: 2.1 mmol/L (ref 0.5–1.9)

## 2019-02-05 LAB — TROPONIN I (HIGH SENSITIVITY): Troponin I (High Sensitivity): 789 ng/L (ref <18)

## 2019-02-05 LAB — PHOSPHORUS: Phosphorus: 2.9 mg/dL (ref 2.5–4.6)

## 2019-02-05 LAB — SARS CORONAVIRUS 2 BY RT PCR (HOSPITAL ORDER, PERFORMED IN ~~LOC~~ HOSPITAL LAB): SARS Coronavirus 2: NEGATIVE

## 2019-02-05 LAB — CDS SEROLOGY

## 2019-02-05 MED ORDER — SODIUM CHLORIDE 0.9 % IV SOLN
INTRAVENOUS | Status: DC
  Administered 2019-02-05 – 2019-02-13 (×12): via INTRAVENOUS

## 2019-02-05 MED ORDER — SUCCINYLCHOLINE CHLORIDE 20 MG/ML IJ SOLN
100.0000 mg | Freq: Once | INTRAMUSCULAR | Status: DC
  Filled 2019-02-05: qty 5

## 2019-02-05 MED ORDER — ACETAMINOPHEN 325 MG PO TABS: 650.0000 mg | ORAL_TABLET | ORAL | Status: DC | PRN

## 2019-02-05 MED ORDER — ALTEPLASE (STROKE) FULL DOSE INFUSION
0.9000 mg/kg | Freq: Once | INTRAVENOUS | Status: AC
  Administered 2019-02-05: 67 mg via INTRAVENOUS
  Filled 2019-02-05: qty 100

## 2019-02-05 MED ORDER — SUCCINYLCHOLINE CHLORIDE 20 MG/ML IJ SOLN
INTRAMUSCULAR | Status: AC | PRN
  Administered 2019-02-05: 120 mg via INTRAVENOUS

## 2019-02-05 MED ORDER — PANTOPRAZOLE SODIUM 40 MG IV SOLR: 40.0000 mg | Freq: Every day | INTRAVENOUS | Status: DC

## 2019-02-05 MED ORDER — SODIUM CHLORIDE 0.9 % IV SOLN
50.0000 mL | Freq: Once | INTRAVENOUS | Status: AC
  Administered 2019-02-05: 50 mL via INTRAVENOUS

## 2019-02-05 MED ORDER — PROPOFOL 1000 MG/100ML IV EMUL
INTRAVENOUS | Status: AC
  Administered 2019-02-05: 22:00:00 10 ug/kg/min
  Filled 2019-02-05: qty 100

## 2019-02-05 MED ORDER — ACETAMINOPHEN 160 MG/5ML PO SOLN: 650.0000 mg | ORAL | Status: DC | PRN

## 2019-02-05 MED ORDER — PANTOPRAZOLE SODIUM 40 MG IV SOLR
40.0000 mg | Freq: Every day | INTRAVENOUS | Status: DC
  Administered 2019-02-05: 22:00:00 40 mg via INTRAVENOUS
  Filled 2019-02-05: qty 40

## 2019-02-05 MED ORDER — SENNOSIDES-DOCUSATE SODIUM 8.6-50 MG PO TABS: 1.0000 | ORAL_TABLET | Freq: Every evening | ORAL | Status: DC | PRN

## 2019-02-05 MED ORDER — LISINOPRIL 10 MG PO TABS: 10.0000 mg | ORAL_TABLET | Freq: Every day | ORAL | Status: DC

## 2019-02-05 MED ORDER — INSULIN ASPART 100 UNIT/ML ~~LOC~~ SOLN
2.0000 [IU] | SUBCUTANEOUS | Status: DC
  Administered 2019-02-05 – 2019-02-06 (×4): 6 [IU] via SUBCUTANEOUS
  Administered 2019-02-06 (×2): 4 [IU] via SUBCUTANEOUS
  Administered 2019-02-06 – 2019-02-07 (×5): 6 [IU] via SUBCUTANEOUS
  Administered 2019-02-07: 4 [IU] via SUBCUTANEOUS
  Administered 2019-02-08: 6 [IU] via SUBCUTANEOUS
  Administered 2019-02-08: 16:00:00 2 [IU] via SUBCUTANEOUS
  Administered 2019-02-08: 6 [IU] via SUBCUTANEOUS
  Administered 2019-02-08 – 2019-02-09 (×4): 4 [IU] via SUBCUTANEOUS
  Administered 2019-02-09: 07:00:00 2 [IU] via SUBCUTANEOUS

## 2019-02-05 MED ORDER — PROPOFOL 1000 MG/100ML IV EMUL
INTRAVENOUS | Status: AC
  Administered 2019-02-05: 30 ug/kg/min
  Filled 2019-02-05: qty 100

## 2019-02-05 MED ORDER — CLEVIDIPINE BUTYRATE 0.5 MG/ML IV EMUL
0.0000 mg/h | INTRAVENOUS | Status: DC
  Administered 2019-02-05: 1 mg/h via INTRAVENOUS

## 2019-02-05 MED ORDER — ETOMIDATE 2 MG/ML IV SOLN: 20.0000 mg | Freq: Once | INTRAVENOUS | Status: DC

## 2019-02-05 MED ORDER — INSULIN ASPART 100 UNIT/ML ~~LOC~~ SOLN: 0.0000 [IU] | SUBCUTANEOUS | Status: DC

## 2019-02-05 MED ORDER — DIPHENHYDRAMINE HCL 50 MG/ML IJ SOLN
50.0000 mg | Freq: Once | INTRAMUSCULAR | Status: AC
  Administered 2019-02-05: 50 mg via INTRAVENOUS
  Filled 2019-02-05: qty 1

## 2019-02-05 MED ORDER — POLYMYXIN B-TRIMETHOPRIM 10000-0.1 UNIT/ML-% OP SOLN: 1.0000 [drp] | OPHTHALMIC | Status: DC

## 2019-02-05 MED ORDER — ETOMIDATE 2 MG/ML IV SOLN
INTRAVENOUS | Status: AC | PRN
  Administered 2019-02-05: 20 mg via INTRAVENOUS

## 2019-02-05 MED ORDER — ACETAMINOPHEN 650 MG RE SUPP
650.0000 mg | RECTAL | Status: DC | PRN
  Administered 2019-02-06 – 2019-02-08 (×2): 650 mg via RECTAL
  Filled 2019-02-05 (×3): qty 1

## 2019-02-05 MED ORDER — ACETAMINOPHEN 160 MG/5ML PO SOLN
650.0000 mg | Freq: Once | ORAL | Status: AC
  Administered 2019-02-05: 19:00:00 650 mg
  Filled 2019-02-05: qty 20.3

## 2019-02-05 MED ORDER — METHYLPREDNISOLONE SODIUM SUCC 125 MG IJ SOLR
125.0000 mg | Freq: Once | INTRAMUSCULAR | Status: AC
  Administered 2019-02-05: 125 mg via INTRAVENOUS
  Filled 2019-02-05: qty 2

## 2019-02-05 MED ORDER — STROKE: EARLY STAGES OF RECOVERY BOOK
Freq: Once | Status: DC
  Filled 2019-02-05: qty 1

## 2019-02-05 NOTE — Progress Notes (Signed)
RT transported pt to and from CT without event.

## 2019-02-05 NOTE — Progress Notes (Signed)
RT transported pt to and from MRI without event.

## 2019-02-05 NOTE — ED Provider Notes (Signed)
[facility] EMERGENCY DEPARTMENT Provider Note   CSN: [id number] Arrival date & time: 02/08/2019  1733     History   Chief Complaint Chief Complaint  Patient presents with  . Trauma    HPI Garrett Henson is a 85 y.o. male.     85 year old male with prior medical history as detailed below presents for evaluation of altered mental status.  Patient was reportedly working in his yard with his spouse.  She reports that his last known normal was at approximately 3:30-3:45.  They had come inside together at that time.  The patient then went back outside to continue yard work.  Shortly thereafter he was found down in the yard.  EMS reports that he was groggy but able to answer simple questions upon their initial evaluation.  Very quickly thereafter his mental status declined.  Upon arrival to the ED his GCS is 3.  His ventilations are being assisted with BVM.  He is unresponsive to painful stimuli in all 4 extremities.  He has a rightward fixed gaze.  Further history is unable to be obtained from the patient given his significant distress.  The history is provided by the patient, the spouse, medical records and the EMS personnel.  Altered Mental Status Presenting symptoms: unresponsiveness   Severity:  Severe Most recent episode:  Today Duration:  2 hours Timing:  Constant Progression:  Unchanged Chronicity:  New   No past medical history on file.  There are no active problems to display for this patient.        Home Medications    Prior to Admission medications   Medication Sig Start Date End Date Taking? Authorizing Provider  aspirin EC 81 MG tablet Take 81 mg by mouth at bedtime.   Yes [provider]  clopidogrel (PLAVIX) 75 MG tablet Take 75 mg by mouth daily.   Yes [provider]  diclofenac sodium (VOLTAREN) 1 % GEL Apply 4 g topically 4 (four) times daily as needed (to painful sites).  01/01/19  Yes [provider]   lisinopril (ZESTRIL) 10 MG tablet Take 10 mg by mouth daily. 02/02/19  Yes [provider]  meclizine (ANTIVERT) 25 MG tablet Take 25 mg by mouth 2 (two) times daily as needed for dizziness. 11/24/18  Yes [provider]  Multiple Vitamins-Minerals (ONE-A-DAY PROACTIVE 65+) TABS Take 1 tablet by mouth daily with breakfast.   Yes [provider]  rosuvastatin (CRESTOR) 10 MG tablet Take 10 mg by mouth daily. 02/02/19  Yes [provider]  triamcinolone cream (KENALOG) 0.1 % Apply 1 application topically 2 (two) times daily as needed (for itching).  09/21/18  Yes [provider]  trimethoprim-polymyxin b (POLYTRIM) ophthalmic solution Place 1 drop into both eyes See admin instructions. INSTILL ONE DROP INTO BOTH EYES 4 TIMES A DAY FOR 2 DAYS AFTER EACH MONTHLY EYE INJECTION (both eyes) 09/05/18  Yes [provider]  metFORMIN (GLUCOPHAGE) 1000 MG tablet Take 1,000 mg by mouth 2 (two) times daily. 12/31/18   [provider]    Family History No family history on file.  Social History Social History   Tobacco Use  . Smoking status: Not on file  Substance Use Topics  . Alcohol use: Not on file  . Drug use: Not on file     Allergies   Iodine   Review of Systems Review of Systems  Unable to perform ROS: Acuity of condition     Physical Exam Updated Vital Signs  BP (!) 164/97 (BP Location: Left Arm)   Pulse 97   Temp (!) 96.3 F (35.7 C)   Resp (!) 21   Ht 6\' 1"  (1.854 m)   Wt 72.6 kg   SpO2 100%   BMI 21.11 kg/m   Physical Exam Vitals signs and nursing note reviewed.  Constitutional:      General: He is not in acute distress.    Appearance: He is well-developed.     Comments: GCS 3 BVM assisted ventilation in progress    HENT:     Head: Normocephalic and atraumatic.  Eyes:     Conjunctiva/sclera: Conjunctivae normal.     Pupils: Pupils are equal, round, and reactive to light.  Neck:     Musculoskeletal:  Normal range of motion and neck supple.  Cardiovascular:     Rate and Rhythm: Normal rate and regular rhythm.     Heart sounds: Normal heart sounds.  Pulmonary:     Effort: Pulmonary effort is normal. No respiratory distress.     Breath sounds: Normal breath sounds.  Abdominal:     General: There is no distension.     Palpations: Abdomen is soft.     Tenderness: There is no abdominal tenderness.  Musculoskeletal: Normal range of motion.        General: No deformity.  Skin:    General: Skin is warm and dry.  Neurological:     Comments: Unresponsive  No withdrawal to painful stimulation in all 4 extremities  Rightward gaze noted  Mild gag reflex present        ED Treatments / Results  Labs (all labs ordered are listed, but only abnormal results are displayed) Labs Reviewed  COMPREHENSIVE METABOLIC PANEL - Abnormal; Notable for the following components:      Result Value   CO2 20 (*)    Glucose, Bld 188 (*)    Calcium 8.8 (*)    GFR calc non Af Amer 55 (*)    All other components within normal limits  CBC - Abnormal; Notable for the following components:   Hemoglobin 11.9 (*)    HCT 37.5 (*)    All other components within normal limits  URINALYSIS, ROUTINE W REFLEX MICROSCOPIC - Abnormal; Notable for the following components:   APPearance HAZY (*)    Ketones, ur 5 (*)    Protein, ur >=300 (*)    Bacteria, UA RARE (*)    All other components within normal limits  LACTIC ACID, PLASMA - Abnormal; Notable for the following components:   Lactic Acid, Venous 2.0 (*)    All other components within normal limits  I-STAT CHEM 8, ED - Abnormal; Notable for the following components:   BUN 24 (*)    Glucose, Bld 191 (*)    Calcium, Ion 1.10 (*)    TCO2 21 (*)    Hemoglobin 12.6 (*)    HCT 37.0 (*)    All other components within normal limits  POCT I-STAT 7, (LYTES, BLD GAS, ICA,H+H) - Abnormal; Notable for the following components:   pO2, Arterial 443.0 (*)    HCT 33.0  (*)    Hemoglobin 11.2 (*)    All other components within normal limits  CBG MONITORING, ED - Abnormal; Notable for the following components:   Glucose-Capillary 168 (*)    All other components within normal limits  SARS CORONAVIRUS 2 (HOSPITAL ORDER, [facility])  ETHANOL  PROTIME-INR  CDS SEROLOGY  SAMPLE TO BLOOD  BANK    EKG None  Radiology Ct Head Wo Contrast  Result Date: 02/10/2019 CLINICAL DATA:  85 year old with facial trauma. Suspected fractures. EXAM: CT HEAD WITHOUT CONTRAST TECHNIQUE: Contiguous axial images were obtained from the base of the skull through the vertex without intravenous contrast. COMPARISON:  Head CT 04/21/2011 FINDINGS: Brain: No evidence for acute hemorrhage, mass lesion, midline shift, hydrocephalus or large infarct. Mild cerebral atrophy. Mild encephalomalacia in the right parietal-occipital region may be related to prior insult or infarct. Vascular: No hyperdense vessel or unexpected calcification. Skull: Negative for calvarial fracture. Sinuses/Orbits: Fluid and mucosal disease in the sphenoid sinuses. Paranasal sinus disease in the frontal sinuses and right maxillary sinus. There appears to be some layering fluid in the nasopharyngeal region. Evidence for endotracheal tube. Other: None IMPRESSION: 1. No acute intracranial abnormality. 2. Paranasal sinus disease particularly in the sphenoid sinus. 3. Question encephalomalacia in the right parietal-occipital region which could be related to old infarct. Electronically Signed   By: Markus Daft M.D.   On: 01/30/2019 18:41   Dg Pelvis Portable  Result Date: 01/24/2019 CLINICAL DATA:  Trauma. The patient was found down in his yard. Altered mental status. EXAM: PORTABLE PELVIS 1-2 VIEWS COMPARISON:  CT scan dated 12/31/2018 FINDINGS: There is no evidence of pelvic fracture or diastasis. No pelvic bone lesions are seen. Hardware in both hips. The previous fractures have completely healed.  IMPRESSION: No acute abnormalities. Electronically Signed   By: Lorriane Shire M.D.   On: 01/19/2019 18:19   Dg Chest Port 1 View  Result Date: 02/10/2019 CLINICAL DATA:  Trauma. The patient was found down. Altered mental status. EXAM: PORTABLE CHEST 1 VIEW COMPARISON:  12/31/2018 FINDINGS: Endotracheal tube is 6 cm above the carina. NG tube tip is in the body of the stomach. Heart size and pulmonary vascularity are normal. No infiltrates or effusions. Minimal chronic thickening of the right minor fissure. Minimal scarring at the right lung base. IMPRESSION: 1. Endotracheal tube is 6 cm above the carina. 2. No acute abnormalities in the chest. 3.  Aortic Atherosclerosis (ICD10-I70.0). Electronically Signed   By: Lorriane Shire M.D.   On: 02/03/2019 18:21    Procedures Procedure Name: Intubation Date/Time: 01/29/2019 8:08 PM Performed by: Valarie Merino, MD Pre-anesthesia Checklist: Patient identified, Patient being monitored, Emergency Drugs available, Timeout performed and Suction available Oxygen Delivery Method: Non-rebreather mask Preoxygenation: Pre-oxygenation with 100% oxygen Induction Type: Rapid sequence Ventilation: Mask ventilation without difficulty Laryngoscope Size: Glidescope and 4 Grade View: Grade I Tube size: 7.0 mm Number of attempts: 1 Placement Confirmation: ETT inserted through vocal cords under direct vision,  CO2 detector,  Breath sounds checked- equal and bilateral and Positive ETCO2 Tube secured with: ETT holder      (including critical care time) CRITICAL CARE Performed by: Valarie Merino   Total critical care time: 45  minutes  Critical care time was exclusive of separately billable procedures and treating other patients.  Critical care was necessary to treat or prevent imminent or life-threatening deterioration.  Critical care was time spent personally by me on the following activities: development of treatment plan with patient and/or surrogate as  well as nursing, discussions with consultants, evaluation of patient's response to treatment, examination of patient, obtaining history from patient or surrogate, ordering and performing treatments and interventions, ordering and review of laboratory studies, ordering and review of radiographic studies, pulse oximetry and re-evaluation of patient's condition.  Medications Ordered in ED Medications  propofol (DIPRIVAN) 1000 MG/100ML infusion (  25 mcg/kg/min  Rate/Dose Change 01/20/2019 1911)  etomidate (AMIDATE) injection (20 mg Intravenous Given 01/22/2019 1737)  succinylcholine (ANECTINE) injection (120 mg Intravenous Given 01/29/2019 1737)  acetaminophen (TYLENOL) solution 650 mg (650 mg Per Tube Given 02/08/2019 1907)  methylPREDNISolone sodium succinate (SOLU-MEDROL) 125 mg/2 mL injection 125 mg (125 mg Intravenous Given 01/15/2019 1905)  diphenhydrAMINE (BENADRYL) injection 50 mg (50 mg Intravenous Given 01/30/2019 1905)     Initial Impression / Assessment and Plan / ED Course  I have reviewed the triage vital signs and the nursing notes.  Pertinent labs & imaging results that were available during my care of the patient were reviewed by me and considered in my medical decision making (see chart for details).        MDM  Screen complete  Garrett Henson was evaluated in Emergency Department on 01/22/2019 for the symptoms described in the history of present illness. He was evaluated in the context of the global COVID-19 pandemic, which necessitated consideration that the patient might be at risk for infection with the SARS-CoV-2 virus that causes COVID-19. Institutional protocols and algorithms that pertain to the evaluation of patients at risk for COVID-19 are in a state of rapid change based on information released by regulatory bodies including the CDC and federal and state organizations. These policies and algorithms were followed during the patient's care in the ED.  Patient arrives via EMS for  evaluation of acute onset alteration in mental status.   Patient was intubated on arrival for protection of his airway.  Last known normal time was at approximately 3:30 PM today.  CT of the head did not reveal acute hemorrhagic bleed.  Neurology is aware of case.  Critical care services is aware of case.  Family is aware of case and plan of care.      Final Clinical Impressions(s) / ED Diagnoses   Final diagnoses:  Altered mental status, unspecified altered mental status type    ED Discharge Orders    None       Valarie Merino, MD 01/16/2019 2011

## 2019-02-05 NOTE — ED Notes (Signed)
Pt returned from MRI °

## 2019-02-05 NOTE — ED Notes (Signed)
ED TO INPATIENT HANDOFF REPORT  ED Nurse Name and Phone #: [phone]  S Name/Age/Gender Garrett Henson 85 y.o. male Room/Bed: TRABC/TRABC  Code Status   Code Status: Full Code  Home/SNF/Other Home  Is this baseline? no  Triage Complete: Triage complete  Chief Complaint LEVEL 1  Triage Note Per [facility] pt from home, found down in his front yard by an unknown person, unsure of downtime, initially speaking with GCFD a/o x4 answering questions appropriately, then became unresponsive with a deviation to the Right. GCS 3, BVM   Allergies Allergies  Allergen Reactions  . Iodine Shortness Of Breath and Other (See Comments)    Patient's wife stated he had a very profound reaction to this and was told by medical personnel he was to "[place]"    Level of Care/Admitting Diagnosis ED Disposition    ED Disposition Condition [place]: [place] [[id number]]  Level of Care: ICU [6]  Covid Evaluation: Confirmed COVID Negative  Diagnosis: Acute cerebrovascular accident (CVA) [facility]) [[id number]]  Admitting Physician: Kandice Hams [[id number]]  Attending Physician: Kipp Brood [[id number]]  Estimated length of stay: > 1 week  Certification:: I certify this patient will need inpatient services for at least 2 midnights  PT Class (Do Not Modify): Inpatient [101]  PT Acc Code (Do Not Modify): Private [1]       B Medical/Surgery History No past medical history on file.    A IV Location/Drains/Wounds Patient Lines/Drains/Airways Status   Active Line/Drains/Airways    Name:   Placement date:   Placement time:   Site:   Days:   Peripheral IV 02/04/2019 Left Antecubital   02/01/2019    1706    Antecubital   less than 1   Peripheral IV 01/18/2019 Anterior;Proximal;Right Forearm   02/01/2019    1707    Forearm   less than 1   Peripheral IV 01/30/2019 Right Antecubital   02/02/2019    1943    Antecubital   less than 1   NG/OG Tube Orogastric 18  Fr. Left mouth Xray;Aucultation   01/14/2019    1750    Left mouth   less than 1   Urethral Catheter Lennette Bihari B Non-latex 16 Fr.   01/25/2019    1740    Non-latex   less than 1   Airway 7 mm   01/26/2019    1740     less than 1          Intake/Output Last 24 hours  Intake/Output Summary (Last 24 hours) at 01/18/2019 2232 Last data filed at 01/19/2019 2154 Gross per 24 hour  Intake 250 ml  Output 20 ml  Net 230 ml    Labs/Imaging Results for orders placed or performed during the hospital encounter of 01/26/2019 (from the past 48 hour(s))  CDS serology     Status: None   Collection Time: 02/03/2019  5:47 PM  Result Value Ref Range   CDS serology specimen      SPECIMEN WILL BE HELD FOR 14 DAYS IF TESTING IS REQUIRED    Comment: SPECIMEN WILL BE HELD FOR 14 DAYS IF TESTING IS REQUIRED SPECIMEN WILL BE HELD FOR 14 DAYS IF TESTING IS REQUIRED Performed at [facility], [place] [address] [place], [place] [address]   Comprehensive metabolic panel     Status: Abnormal   Collection Time: 01/15/2019  5:47 PM  Result Value Ref Range   Sodium 136 135 -  145 mmol/L   Potassium 4.0 3.5 - 5.1 mmol/L   Chloride 103 98 - 111 mmol/L   CO2 20 (L) 22 - 32 mmol/L   Glucose, Bld 188 (H) 70 - 99 mg/dL   BUN 22 8 - 23 mg/dL   Creatinine, Ser 1.15 0.61 - 1.24 mg/dL   Calcium 8.8 (L) 8.9 - 10.3 mg/dL   Total Protein 6.7 6.5 - 8.1 g/dL   Albumin 3.6 3.5 - 5.0 g/dL   AST 24 15 - 41 U/L   ALT 11 0 - 44 U/L   Alkaline Phosphatase 73 38 - 126 U/L   Total Bilirubin 0.3 0.3 - 1.2 mg/dL   GFR calc non Af Amer 55 (L) >60 mL/min   GFR calc Af Amer >60 >60 mL/min   Anion gap 13 5 - 15    Comment: Performed at [facility] [address], [place], [place] [address]  CBC     Status: Abnormal   Collection Time: 02/11/2019  5:47 PM  Result Value Ref Range   WBC 8.3 4.0 - 10.5 K/uL   RBC 4.44 4.22 - 5.81 MIL/uL   Hemoglobin 11.9 (L) 13.0 - 17.0 g/dL   HCT 37.5 (L) 39.0 - 52.0 %   MCV 84.5 80.0 - 100.0 fL   MCH  26.8 26.0 - 34.0 pg   MCHC 31.7 30.0 - 36.0 g/dL   RDW 14.0 11.5 - 15.5 %   Platelets 204 150 - 400 K/uL   nRBC 0.0 0.0 - 0.2 %    Comment: Performed at [facility], [place] [address], [place], [place] [address]  Ethanol     Status: None   Collection Time: 01/22/2019  5:47 PM  Result Value Ref Range   Alcohol, Ethyl (B) <10 <10 mg/dL    Comment: (NOTE) Lowest detectable limit for serum alcohol is 10 mg/dL. For medical purposes only. Performed at [facility], [place] [address] [place], [place] [address]   Urinalysis, Routine w reflex microscopic     Status: Abnormal   Collection Time: 01/14/2019  5:47 PM  Result Value Ref Range   Color, Urine YELLOW YELLOW   APPearance HAZY (A) CLEAR   Specific Gravity, Urine 1.014 1.005 - 1.030   pH 5.0 5.0 - 8.0   Glucose, UA NEGATIVE NEGATIVE mg/dL   Hgb urine dipstick NEGATIVE NEGATIVE   Bilirubin Urine NEGATIVE NEGATIVE   Ketones, ur 5 (A) NEGATIVE mg/dL   Protein, ur >=300 (A) NEGATIVE mg/dL   Nitrite NEGATIVE NEGATIVE   Leukocytes,Ua NEGATIVE NEGATIVE   RBC / HPF 0-5 0 - 5 RBC/hpf   WBC, UA 6-10 0 - 5 WBC/hpf   Bacteria, UA RARE (A) NONE SEEN   WBC Clumps PRESENT    Mucus PRESENT    Granular Casts, UA PRESENT     Comment: Performed at [facility] [address], [place], [place] [address]  Lactic acid, plasma     Status: Abnormal   Collection Time: 01/14/2019  5:47 PM  Result Value Ref Range   Lactic Acid, Venous 2.0 (HH) 0.5 - 1.9 mmol/L    Comment: CRITICAL RESULT CALLED TO, READ BACK BY AND VERIFIED WITH: L SHULER,RN 1901 01/21/2019 WBOND Performed at [facility], [place] [address], [place], [place] [address]   Protime-INR     Status: None   Collection Time: 01/14/2019  5:47 PM  Result Value Ref Range   Prothrombin Time 13.3 11.4 - 15.2 seconds  INR 1.0 0.8 - 1.2    Comment: (NOTE) INR goal varies based on device and disease states. Performed at [facility], [place] [address] [place],  [place] [address]   SARS Coronavirus 2 [facility] order, Performed in [facility] hospital lab) Nasopharyngeal Nasopharyngeal Swab     Status: None   Collection Time: 02/02/2019  5:52 PM   Specimen: Nasopharyngeal Swab  Result Value Ref Range   SARS Coronavirus 2 NEGATIVE NEGATIVE    Comment: (NOTE) If result is NEGATIVE SARS-CoV-2 target nucleic acids are NOT DETECTED. The SARS-CoV-2 RNA is generally detectable in upper and lower  respiratory specimens during the acute phase of infection. The lowest  concentration of SARS-CoV-2 viral copies this assay can detect is 250  copies / mL. A negative result does not preclude SARS-CoV-2 infection  and should not be used as the sole basis for treatment or other  patient management decisions.  A negative result may occur with  improper specimen collection / handling, submission of specimen other  than nasopharyngeal swab, presence of viral mutation(s) within the  areas targeted by this assay, and inadequate number of viral copies  (<250 copies / mL). A negative result must be combined with clinical  observations, patient history, and epidemiological information. If result is POSITIVE SARS-CoV-2 target nucleic acids are DETECTED. The SARS-CoV-2 RNA is generally detectable in upper and lower  respiratory specimens dur ing the acute phase of infection.  Positive  results are indicative of active infection with SARS-CoV-2.  Clinical  correlation with patient history and other diagnostic information is  necessary to determine patient infection status.  Positive results do  not rule out bacterial infection or co-infection with other viruses. If result is PRESUMPTIVE POSTIVE SARS-CoV-2 nucleic acids MAY BE PRESENT.   A presumptive positive result was obtained on the submitted specimen  and confirmed on repeat testing.  While 2019 novel coronavirus  (SARS-CoV-2) nucleic acids may be present in the submitted sample  additional confirmatory testing may be  necessary for epidemiological  and / or clinical management purposes  to differentiate between  SARS-CoV-2 and other Sarbecovirus currently known to infect humans.  If clinically indicated additional testing with an alternate test  methodology [phone]) is advised. The SARS-CoV-2 RNA is generally  detectable in upper and lower respiratory sp ecimens during the acute  phase of infection. The expected result is Negative. Fact Sheet for Patients:  StrictlyIdeas.no Fact Sheet for Healthcare Providers: BankingDealers.co.za This test is not yet approved or cleared by the [place] FDA and has been authorized for detection and/or diagnosis of SARS-CoV-2 by FDA under an Emergency Use Authorization (EUA).  This EUA will remain in effect (meaning this test can be used) for the duration of the COVID-19 declaration under Section 564(b)(1) of the Act, 21 U.S.C. section 360bbb-3(b)(1), unless the authorization is terminated or revoked sooner. Performed at [facility], [place] [address], [place], [place] [address]   Sample to Blood Bank     Status: None   Collection Time: 02/04/2019  6:09 PM  Result Value Ref Range   Blood Bank Specimen SAMPLE AVAILABLE FOR TESTING    Sample Expiration      02/06/2019,2359 Performed at [facility], [place] [address] [place], [place] [address]   I-stat chem 8, ED     Status: Abnormal   Collection Time: 01/24/2019  6:13 PM  Result Value Ref Range   Sodium 136 135 - 145 mmol/L   Potassium 3.9 3.5 -  5.1 mmol/L   Chloride 104 98 - 111 mmol/L   BUN 24 (H) 8 - 23 mg/dL   Creatinine, Ser 1.00 0.61 - 1.24 mg/dL   Glucose, Bld 191 (H) 70 - 99 mg/dL   Calcium, Ion 1.10 (L) 1.15 - 1.40 mmol/L   TCO2 21 (L) 22 - 32 mmol/L   Hemoglobin 12.6 (L) 13.0 - 17.0 g/dL   HCT 37.0 (L) 39.0 - 52.0 %  I-STAT 7, (LYTES, BLD GAS, ICA, H+H)     Status: Abnormal   Collection Time: 01/26/2019  6:52 PM  Result Value Ref Range    pH, Arterial 7.384 7.350 - 7.450   pCO2 arterial 38.4 32.0 - 48.0 mmHg   pO2, Arterial 443.0 (H) 83.0 - 108.0 mmHg   Bicarbonate 23.1 20.0 - 28.0 mmol/L   TCO2 24 22 - 32 mmol/L   O2 Saturation 100.0 %   Acid-base deficit 2.0 0.0 - 2.0 mmol/L   Sodium 136 135 - 145 mmol/L   Potassium 3.9 3.5 - 5.1 mmol/L   Calcium, Ion 1.19 1.15 - 1.40 mmol/L   HCT 33.0 (L) 39.0 - 52.0 %   Hemoglobin 11.2 (L) 13.0 - 17.0 g/dL   Patient temperature 97.2 F    Sample type ARTERIAL   CBG monitoring, ED     Status: Abnormal   Collection Time: 01/30/2019  6:53 PM  Result Value Ref Range   Glucose-Capillary 168 (H) 70 - 99 mg/dL  CBG monitoring, ED     Status: Abnormal   Collection Time: 01/19/2019  7:27 PM  Result Value Ref Range   Glucose-Capillary 168 (H) 70 - 99 mg/dL  CBG monitoring, ED     Status: Abnormal   Collection Time: 02/09/2019 10:05 PM  Result Value Ref Range   Glucose-Capillary 207 (H) 70 - 99 mg/dL   Ct Head Wo Contrast  Result Date: 02/12/2019 CLINICAL DATA:  85 year old with facial trauma. Suspected fractures. EXAM: CT HEAD WITHOUT CONTRAST TECHNIQUE: Contiguous axial images were obtained from the base of the skull through the vertex without intravenous contrast. COMPARISON:  Head CT 04/21/2011 FINDINGS: Brain: No evidence for acute hemorrhage, mass lesion, midline shift, hydrocephalus or large infarct. Mild cerebral atrophy. Mild encephalomalacia in the right parietal-occipital region may be related to prior insult or infarct. Vascular: No hyperdense vessel or unexpected calcification. Skull: Negative for calvarial fracture. Sinuses/Orbits: Fluid and mucosal disease in the sphenoid sinuses. Paranasal sinus disease in the frontal sinuses and right maxillary sinus. There appears to be some layering fluid in the nasopharyngeal region. Evidence for endotracheal tube. Other: None IMPRESSION: 1. No acute intracranial abnormality. 2. Paranasal sinus disease particularly in the sphenoid sinus. 3. Question  encephalomalacia in the right parietal-occipital region which could be related to old infarct. Electronically Signed   By: Markus Daft M.D.   On: 01/15/2019 18:41   Ct Cervical Spine Wo Contrast  Result Date: 02/08/2019 CLINICAL DATA:  85 year old found down on the front yard. Patient became unresponsive. EXAM: CT CERVICAL SPINE WITHOUT CONTRAST TECHNIQUE: Multidetector CT imaging of the cervical spine was performed without intravenous contrast. Multiplanar CT image reconstructions were also generated. COMPARISON:  None. FINDINGS: Alignment: Marked lordosis of the cervical spine. Skull base and vertebrae: No acute fracture. No primary bone lesion or focal pathologic process. Soft tissues and spinal canal: No prevertebral fluid or swelling. No visible canal hematoma. Absent or hypoplastic left thyroid tissue. Findings may be related to previous left hemithyroidectomy. No significant lymph node enlargement. Disc levels: Posterior disc space  narrowing at C5-C6 and C6-C7. Multilevel degenerative facet disease. Upper chest: Scarring at the right lung apex. Other: Endotracheal tube and orogastric tube are present. IMPRESSION: 1. No acute bone abnormality in cervical spine. 2. Prominent scarring at the right lung apex is only partially imaged. Electronically Signed   By: Markus Daft M.D.   On: 01/19/2019 18:57   Mr Angio Head Wo Contrast  Result Date: 01/19/2019 CLINICAL DATA:  Initial evaluation for acute stroke, found down. EXAM: MRI HEAD WITHOUT CONTRAST MRA HEAD WITHOUT CONTRAST TECHNIQUE: Multiplanar, multiecho pulse sequences of the brain and surrounding structures were obtained without intravenous contrast. Angiographic images of the head were obtained using MRA technique without contrast. COMPARISON:  Comparison made with prior CT from earlier the same day as well as previous MRI from 04/22/2011. FINDINGS: MRI HEAD FINDINGS Brain: Generalized age-related cerebral atrophy. Patchy and confluent T2/FLAIR  hyperintensity within the periventricular deep white matter both cerebral hemispheres most consistent with chronic small vessel ischemic disease, mild for age. Few scattered remote lacunar infarcts noted within the hemispheric white matter. Small remote right PCA territory infarct involving the right parieto-occipital region noted. Associated chronic hemosiderin staining. Few tiny remote bilateral cerebellar infarcts. Extensive restricted diffusion involving predominantly the frontal and parietal lobes seen bilaterally, left slightly worse than right. Involvement is most pronounced within the cortical gray matter, although there is some patchy subcortical involvement. Ischemic change involving the bilateral cerebral hemispheres extends in a somewhat linear watershed fashion. Mild patchy involvement of the occipital poles as well. Patchy ischemic change within the thalami bilaterally. Restricted diffusion also seen involving the bilateral cerebellar hemispheres, left slightly worse than right. Patchy involvement of the lateral right medulla. No associated hemorrhage or significant mass effect. Overall, constellation of findings felt to be most consistent with a global hypoperfusion of than within [facility] a period No mass lesion, midline shift or mass effect. No hydrocephalus. No extra-axial fluid collection. Pituitary gland suprasellar region normal. Midline structures intact. Vascular: Major intracranial vascular flow voids are maintained. Probable DVA noted within the left cerebellum. Skull and upper cervical spine: Craniocervical junction within normal limits. Upper cervical spine normal. Bone marrow signal intensity within normal limits. Right frontal scalp contusion present. Sinuses/Orbits: Patient status post bilateral ocular lens replacement. Globes and orbital soft tissues demonstrate no acute finding. Extensive seen throughout the ethmoidal air cells and sphenoid sinuses with layering fluid within the right  maxillary sinus. Fluid seen within the nasopharynx. Patient is intubated. Small right mastoid effusion. Other: None. MRA HEAD FINDINGS ANTERIOR CIRCULATION: Distal cervical segments of the internal carotid arteries are patent with antegrade flow. Petrous, cavernous, and supraclinoid ICAs patent without hemodynamically significant stenosis. A1 segments patent bilaterally. Normal anterior communicating artery. Anterior cerebral arteries patent to their distal aspects without flow-limiting stenosis. No M1 stenosis or occlusion. Negative MCA bifurcations. Distal MCA branches well perfused and symmetric. POSTERIOR CIRCULATION: Vertebral arteries patent to the vertebrobasilar junction without stenosis. Left vertebral artery dominant. Patent left PICA. Right PICA not seen. Basilar patent to its distal aspect without stenosis. Superior cerebral arteries patent bilaterally. Both of the posterior cerebral arteries primarily supplied via the basilar and are well perfused to their distal aspects. No intracranial aneurysm. IMPRESSION: MRI HEAD IMPRESSION: 1. Extensive ischemic changes involving the bilateral cerebral and cerebellar hemispheres, as well as involvement of the bilateral thalami and right lateral medulla. Constellation of findings felt to be most consistent with hypoperfusion injury/cerebral anoxic event. No associated hemorrhage or significant mass effect. 2. Underlying age-related cerebral atrophy  with mild chronic small vessel ischemic disease, with additional chronic ischemic changes as above. MRA HEAD IMPRESSION: Negative intracranial MRA with wide patency of the large and medium sized vessels of the intracranial circulation. No hemodynamically significant or correctable stenosis. Electronically Signed   By: Jeannine Boga M.D.   On: 02/06/2019 22:08   Mr Brain Wo Contrast  Result Date: 02/11/2019 CLINICAL DATA:  Initial evaluation for acute stroke, found down. EXAM: MRI HEAD WITHOUT CONTRAST MRA HEAD  WITHOUT CONTRAST TECHNIQUE: Multiplanar, multiecho pulse sequences of the brain and surrounding structures were obtained without intravenous contrast. Angiographic images of the head were obtained using MRA technique without contrast. COMPARISON:  Comparison made with prior CT from earlier the same day as well as previous MRI from 04/22/2011. FINDINGS: MRI HEAD FINDINGS Brain: Generalized age-related cerebral atrophy. Patchy and confluent T2/FLAIR hyperintensity within the periventricular deep white matter both cerebral hemispheres most consistent with chronic small vessel ischemic disease, mild for age. Few scattered remote lacunar infarcts noted within the hemispheric white matter. Small remote right PCA territory infarct involving the right parieto-occipital region noted. Associated chronic hemosiderin staining. Few tiny remote bilateral cerebellar infarcts. Extensive restricted diffusion involving predominantly the frontal and parietal lobes seen bilaterally, left slightly worse than right. Involvement is most pronounced within the cortical gray matter, although there is some patchy subcortical involvement. Ischemic change involving the bilateral cerebral hemispheres extends in a somewhat linear watershed fashion. Mild patchy involvement of the occipital poles as well. Patchy ischemic change within the thalami bilaterally. Restricted diffusion also seen involving the bilateral cerebellar hemispheres, left slightly worse than right. Patchy involvement of the lateral right medulla. No associated hemorrhage or significant mass effect. Overall, constellation of findings felt to be most consistent with a global hypoperfusion of than within [facility] E a period No mass lesion, midline shift or mass effect. No hydrocephalus. No extra-axial fluid collection. Pituitary gland suprasellar region normal. Midline structures intact. Vascular: Major intracranial vascular flow voids are maintained. Probable DVA noted within the left  cerebellum. Skull and upper cervical spine: Craniocervical junction within normal limits. Upper cervical spine normal. Bone marrow signal intensity within normal limits. Right frontal scalp contusion present. Sinuses/Orbits: Patient status post bilateral ocular lens replacement. Globes and orbital soft tissues demonstrate no acute finding. Extensive seen throughout the ethmoidal air cells and sphenoid sinuses with layering fluid within the right maxillary sinus. Fluid seen within the nasopharynx. Patient is intubated. Small right mastoid effusion. Other: None. MRA HEAD FINDINGS ANTERIOR CIRCULATION: Distal cervical segments of the internal carotid arteries are patent with antegrade flow. Petrous, cavernous, and supraclinoid ICAs patent without hemodynamically significant stenosis. A1 segments patent bilaterally. Normal anterior communicating artery. Anterior cerebral arteries patent to their distal aspects without flow-limiting stenosis. No M1 stenosis or occlusion. Negative MCA bifurcations. Distal MCA branches well perfused and symmetric. POSTERIOR CIRCULATION: Vertebral arteries patent to the vertebrobasilar junction without stenosis. Left vertebral artery dominant. Patent left PICA. Right PICA not seen. Basilar patent to its distal aspect without stenosis. Superior cerebral arteries patent bilaterally. Both of the posterior cerebral arteries primarily supplied via the basilar and are well perfused to their distal aspects. No intracranial aneurysm. IMPRESSION: MRI HEAD IMPRESSION: 1. Extensive ischemic changes involving the bilateral cerebral and cerebellar hemispheres, as well as involvement of the bilateral thalami and right lateral medulla. Constellation of findings felt to be most consistent with hypoperfusion injury/cerebral anoxic event. No associated hemorrhage or significant mass effect. 2. Underlying age-related cerebral atrophy with mild chronic small vessel ischemic disease,  with additional chronic  ischemic changes as above. MRA HEAD IMPRESSION: Negative intracranial MRA with wide patency of the large and medium sized vessels of the intracranial circulation. No hemodynamically significant or correctable stenosis. Electronically Signed   By: Jeannine Boga M.D.   On: 02/01/2019 22:08   Dg Pelvis Portable  Result Date: 02/02/2019 CLINICAL DATA:  Trauma. The patient was found down in his yard. Altered mental status. EXAM: PORTABLE PELVIS 1-2 VIEWS COMPARISON:  CT scan dated 12/31/2018 FINDINGS: There is no evidence of pelvic fracture or diastasis. No pelvic bone lesions are seen. Hardware in both hips. The previous fractures have completely healed. IMPRESSION: No acute abnormalities. Electronically Signed   By: Lorriane Shire M.D.   On: 02/02/2019 18:19   Dg Chest Port 1 View  Result Date: 02/07/2019 CLINICAL DATA:  Trauma. The patient was found down. Altered mental status. EXAM: PORTABLE CHEST 1 VIEW COMPARISON:  12/31/2018 FINDINGS: Endotracheal tube is 6 cm above the carina. NG tube tip is in the body of the stomach. Heart size and pulmonary vascularity are normal. No infiltrates or effusions. Minimal chronic thickening of the right minor fissure. Minimal scarring at the right lung base. IMPRESSION: 1. Endotracheal tube is 6 cm above the carina. 2. No acute abnormalities in the chest. 3.  Aortic Atherosclerosis (ICD10-I70.0). Electronically Signed   By: Lorriane Shire M.D.   On: 01/23/2019 18:21   Ct Maxillofacial Wo Contrast  Result Date: 01/21/2019 CLINICAL DATA:  Facial trauma. EXAM: CT MAXILLOFACIAL WITHOUT CONTRAST TECHNIQUE: Multidetector CT imaging of the maxillofacial structures was performed. Multiplanar CT image reconstructions were also generated. COMPARISON:  Head CT 02/11/2019 FINDINGS: Osseous: Patient is edentulous. Mandible is intact. Zygomatic arches are intact. Pterygoid plates appear to be intact. No evidence for acute facial bone fracture. Orbits: Negative. No traumatic or  inflammatory finding. Sinuses: Air and fluid within the sphenoid sinuses. Mild mucosal disease in the frontal sinuses and maxillary sinuses. Soft tissues: No focal soft tissue abnormality. There is an endotracheal tube and evidence for orogastric tube. Limited intracranial: No acute abnormality. IMPRESSION: 1. No acute facial bone fracture. 2. Gas and fluid in the sphenoid sinus may be related to recent intubation. Mild paranasal sinus disease. Electronically Signed   By: Markus Daft M.D.   On: 01/17/2019 18:50    Pending Labs Unresulted Labs (From admission, onward)    Start     Ordered   02/06/19 0500  Hemoglobin A1c  Tomorrow morning,   R     02/01/2019 2041   02/06/19 0500  Lipid panel  Tomorrow morning,   R    Comments: Fasting    01/24/2019 2041   02/06/19 0500  CBC  Tomorrow morning,   R     01/17/2019 2122   02/06/19 0737  Basic metabolic panel  Tomorrow morning,   R     02/08/2019 2122   02/06/19 0500  Blood gas, arterial  Tomorrow morning,   R     01/28/2019 2122   02/12/2019 2120  Magnesium  Once,   STAT     01/19/2019 2122   02/12/2019 2120  Phosphorus  Once,   STAT     02/03/2019 2122   02/08/2019 2016  Lactic acid, plasma  ONCE - STAT,   STAT     02/12/2019 2016          Vitals/Pain Today's Vitals   01/24/2019 2154 02/04/2019 2156 01/15/2019 2158 01/28/2019 2200  BP: 114/74 134/76 120/73 123/70  Pulse: 72 70 71  75  Resp: 12 14 13 13   Temp: (!) 95.1 F (35.1 C) (!) 95.1 F (35.1 C) (!) 95.1 F (35.1 C) (!) 95.1 F (35.1 C)  TempSrc:      SpO2: 100% 100% 100% 100%  Weight:      Height:        Isolation Precautions No active isolations  Medications Medications  clevidipine (CLEVIPREX) infusion 0.5 mg/mL (0 mg/hr Intravenous Stopped 02/04/2019 2017)   stroke: mapping our early stages of recovery book (has no administration in time range)  0.9 %  sodium chloride infusion ( Intravenous New Bag/Given 01/15/2019 2209)  acetaminophen (TYLENOL) suppository 650 mg (has no administration in time  range)  senna-docusate (Senokot-S) tablet 1 tablet (has no administration in time range)  lisinopril (ZESTRIL) tablet 10 mg (has no administration in time range)  trimethoprim-polymyxin b (POLYTRIM) ophthalmic solution 1 drop (has no administration in time range)  pantoprazole (PROTONIX) injection 40 mg (40 mg Intravenous Given 02/11/2019 2219)  insulin aspart (novoLOG) injection 2-6 Units (6 Units Subcutaneous Given 01/28/2019 2222)  propofol (DIPRIVAN) 1000 MG/100ML infusion (  Stopped 02/09/2019 2231)  etomidate (AMIDATE) injection (20 mg Intravenous Given 02/10/2019 1737)  succinylcholine (ANECTINE) injection (120 mg Intravenous Given 01/14/2019 1737)  acetaminophen (TYLENOL) solution 650 mg (650 mg Per Tube Given 01/14/2019 1907)  methylPREDNISolone sodium succinate (SOLU-MEDROL) 125 mg/2 mL injection 125 mg (125 mg Intravenous Given 01/30/2019 1905)  diphenhydrAMINE (BENADRYL) injection 50 mg (50 mg Intravenous Given 01/17/2019 1905)  alteplase (ACTIVASE) 1 mg/mL infusion 67 mg (0 mg/kg  74.4 kg Intravenous Stopped 01/14/2019 2050)    Followed by  0.9 %  sodium chloride infusion (0 mLs Intravenous Stopped 01/26/2019 2154)  propofol (DIPRIVAN) 1000 MG/100ML infusion (10 mcg/kg/min  New Bag/Given 01/18/2019 2218)    Mobility walks     Focused Assessments Neuro Assessment Handoff:  Swallow screen pass?  Cardiac Rhythm: Sinus tachycardia NIH Stroke Scale ( + Modified Stroke Scale Criteria)  Interval: Other (Comment)(q 30) Level of Consciousness (1a.)   : Responds only with reflex motor or autonomic effects or totally unresponsive, flaccid, and areflexic LOC Questions (1b. )   +: Answers neither question correctly LOC Commands (1c. )   + : Performs neither task correctly Best Gaze (2. )  +: Forced deviation Visual (3. )  +: Bilateral hemianopia (blind including cortical blindness) Facial Palsy (4. )    : Complete paralysis of one or both sides Motor Arm, Left (5a. )   +: No effort against gravity Motor Arm,  Right (5b. )   +: No movement Motor Leg, Left (6a. )   +: No effort against gravity Motor Leg, Right (6b. )   +: No effort against gravity Limb Ataxia (7. ): Absent Sensory (8. )   +: Severe to total sensory loss, patient is not aware of being touched in the face, arm, and leg Best Language (9. )   +: Mute, global aphasia Dysarthria (10. ): Intubated or other physical barrier Extinction/Inattention (11.)   +: Profound hemi-inattention or extinction to more than one modality Modified SS Total  +: 29 Complete NIHSS TOTAL: 35 Last date known well: 01/14/2019 Last time known well: 1600 Neuro Assessment: Exceptions to WDL Neuro Checks:   Initial (01/15/2019 1944)  Last Documented NIHSS Modified Score: 29 (02/07/2019 2200) Has TPA been given? yes If patient is a Neuro Trauma and patient is going to OR before floor call report to [place] nurse: [phone] or [phone]     R Recommendations:  See Admitting Provider Note  Report given to:   Additional Notes:

## 2019-02-05 NOTE — ED Notes (Signed)
10mg  labetalol IV given per verbal order Dr. Cheral Marker.

## 2019-02-05 NOTE — ED Notes (Signed)
CCM at bedside, updating family on results

## 2019-02-05 NOTE — ED Notes (Signed)
Pt in MRI scanner

## 2019-02-05 NOTE — ED Notes (Signed)
Family gone home for the night. Vickii Chafe, wife, can be contacted at [phone]. Wife took all belongings home (wallets, keys, clothes)

## 2019-02-05 NOTE — ED Notes (Signed)
Neuro paged for bp 183/92; tpa infusing

## 2019-02-05 NOTE — Progress Notes (Signed)
PHARMACIST CODE STROKE RESPONSE  Notified to mix tPA at 1939 by Dr. Cheral Marker Delivered tPA to RN at 1944  tPA dose = 6.7 mg bolus over 1 minute followed by 60.3 mg for a total dose of 67 mg over 1 hour  Issues/delays encountered (if applicable): N/A  Lorel Monaco, PharmD PGY1 [place] Resident [employer] # [phone]
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
## 2019-02-05 NOTE — Progress Notes (Signed)
Chaplain checked back in with family and was able to facilitate 2 family members going to bedside in ED.  Physician updated spouse and daughter.  Chaplain offered support at the bedside.  Family is going home for the night, patient being admitted, all emergency contact information updated.  Chaplain walked the family out.  Chaplain available for any further needs. Chaplain Katherene Ponto

## 2019-02-05 NOTE — ED Notes (Signed)
All documentation from [phone] charted by Eliott Nine RN
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
## 2019-02-05 NOTE — ED Notes (Signed)
MRI ready for pt; RT made aware
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
## 2019-02-05 NOTE — H&P (Signed)
NAME:  Garrett Henson, MRN:  [id number], DOB:  June 29, 1924, LOS: 0 ADMISSION DATE:  01/14/2019, CONSULTATION DATE:  02/07/2019 REFERRING MD:  Dr. Cheral Marker, CHIEF COMPLAINT:  AMS  Brief History   85 yoM presenting from home after being found down in yard presenting as code stroke with altered mental status.  Rapid decline in mental status with EMS.  LSW 1645.  Required intubation on arrival to ER for airway protection with GCS 3 and right gaze.  CTH neg.  tPA given.  CTA deferred due to iodine allergy.  Taken for stat MRI.  Neurology consulting, PCCM asked to admit.   History of present illness   HPI obtained from medical chart review as patient is intubated and sedated.  Chart pending merger with MRN [id number].    85 year old male with prior history of HTN, DM, GERD, CAD on ASA/ plavix, HLD and wedge resection of non-small cell stage I a carcinoma the lung from the right lower lobe presenting in 2017 presenting as code stroke from home.   Reportedly working outside in his yard today in his normal state of health when found down in yard by his wife after doing yard work.  Last known well 1615.  Found by EMS awake and able to answer questions with some facial abrasions.  Had rapid decline in mental status with EMS.  Upon arrival to ER, GCS declined to 3 with rightward fixed gaze and required intubation for airway protection. No seizure activity reported.  CTH negative.  CT maxiofacial, cervical, and pelvis XR negative for trauma workup.  Was hypertensive 184/83 treated with IV labetalol.  Labs noted for glucose 188, LA 2, Hgb 11.9, and normal coags. CXR with satisfactory ETT placement and without acute process.  EKG non-acute, SR but abnormal Rwave changes, minimal ST depression.  Due to prior severe iodine allergy, CT with contrast deferred.  Neurology concerned for possibility of basilar artery thrombosis with acute brainstem stroke, therefore patient given tPA.  Patient taken for emergent MRI, Neurology  consulting and PCCM to admit.   Delayed PCCM assessment as patient went for stat MRI.   Past Medical History   Past Medical History:  Diagnosis Date   Arthritis    Back pain    Coronary artery disease    takes Plavix daily   Diabetes mellitus    takes Metformin daily   GERD (gastroesophageal reflux disease)    takes tagamet daily   Headache    HX MIGRAINES   History of blood transfusion    no abnormal reaction noted   History of colon polyps    benign   History of migraine    many yrs ago   Hyperlipidemia    takes Crestor daily   Hypertension    takes Amlodipine daily   Joint swelling    Lung mass 12/01/2014   Lung mass 12/01/2014   Macular degeneration    wet and on the left;injection 01/20/15   Nocturia    Overactive bladder    takes Myrbetriq daily   Prostate cancer ([place])    Stroke [facility])    TIA (transient ischemic attack)    Urinary frequency    Urinary urgency    Vertigo    takes Antivert daily as needed        Patient Active Problem List   Diagnosis Date Noted   Preop cardiovascular exam 03/02/2015   Cerebrovascular disease 03/02/2015   Hyperlipidemia 03/02/2015   Lung cancer ([place]) 01/26/2015   DM2 (diabetes  mellitus, type 2) ([place]) 04/23/2011   CAD (coronary artery disease) 04/23/2011   Stroke ([place]) 04/22/2011   Chest pain 04/22/2011   Hypertension     Significant Hospital Events   9/23 Admit  Consults:  Neurology  Procedures:  9/23 ETT >>  Significant Diagnostic Tests:  9/23 CTH >> 1. No acute intracranial abnormality. 2. Paranasal sinus disease particularly in the sphenoid sinus. 3. Question encephalomalacia in the right parietal-occipital region which could be related to old infarct.  CT maxillofacial >> 1. No acute facial bone fracture. 2. Gas and fluid in the sphenoid sinus may be related to recent intubation. Mild paranasal sinus disease.  CT cervical >> 1. No acute  bone abnormality in cervical spine. 2. Prominent scarring at the right lung apex is only partially imaged.  9/23 Pelvis XR >> neg  9/23 MRI brain / angio >>  Micro Data:  9/23 SARS Cov2 >> neg  Antimicrobials:   Interim history/subjective:  Currently on low dose propofol, just returning from MRI.  Rn reports patient was gagging on ETT with some spont movement on left side prior to going for MRI  Objective   Blood pressure 123/70, pulse 75, temperature (!) 95.1 F (35.1 C), resp. rate 13, height 5\' 10"  (1.778 m), weight 74.4 kg, SpO2 100 %.    Vent Mode: PRVC FiO2 (%):  [40 %-100 %] 40 % Set Rate:  [14 bmp-16 bmp] 16 bmp Vt Set:  [480 mL-580 mL] 480 mL PEEP:  [5 cmH20] 5 cmH20 Plateau Pressure:  [14 cmH20-18 cmH20] 18 cmH20   Intake/Output Summary (Last 24 hours) at 01/24/2019 2239 Last data filed at 01/20/2019 2154 Gross per 24 hour  Intake 250 ml  Output 20 ml  Net 230 ml   Filed Weights   02/09/2019 1744 01/24/2019 1900 02/04/2019 1935  Weight: 72.6 kg 74.4 kg 74.4 kg   Evaluated at 2210 on return to ER from MRI  Examination: General:  Elderly male unresponsive on MV HEENT: MM pink/dry, dried blood on face, right frontal hematoma and abrasion, pupils 2/ non reactive, absent corneal reflexes, ETT 7.5 21 cm at lip, OGT, ccollar in place Neuro:  No purposeful movement, unresponsive, no movement in RUE, minimal response in LUE, withdrawals in BLE, absent gag CV: SR, no murmur PULM:  MV supported breaths, CTA- absent cough GI: soft, +BS, foley present Extremities: cool/dry, no LE edema  Skin: no rashes, skin bruising to bilateral arms- wrapped   Resolved Hospital Problem list    Assessment & Plan:   Acute respiratory insufficiency related to acute encephalopathy P:  Full MV support, PRVC 8 cc/kg, rate 14 CXR/ ABG- reviewed/ no changes, wean FiO2 as able for SpO2 goal > 92% trend CXR/ ABG as needed VAP bundle Daily SBT/ WUA   Acute encephalopathy concerning for acute  CVA s/p tPA in ER - CTH neg, ETOH neg - unable to do CTA due to iodine allergy  P:  Admit to Neuro ICU Strict neuro checks, minimize sedation as able PAD Protocol with propofol/ prn fentanyl for RASS goal 0-/1 Further stroke workup per Neurology Appreciate Neurology assistance MRI / MRA brain pending  EEG ordered  Lipid, TSH, TTE, carotid dopplers pending  PT/OT/ SLP when appropriate    HTN P:  Tele monitoring Cleviprex/ Prn labetalol for SBP goal post tPA <180 TTE as above Assess hs-trop x 3, repeat EKG Continue home lisinopril (normal sCr)- next dose in am  Monitor renal function/ BMP in am    HLD P:  Lipid panel pending   DM P:  SSI/ CBG q 4 A1C ordered    Best practice:  Diet: NPO Pain/Anxiety/Delirium protocol (if indicated): propofol/ prn fentanyl VAP protocol (if indicated): yes DVT prophylaxis: s/p tPA GI prophylaxis: PPI Glucose control: SSI Mobility: BR Code Status: Limited- NO CPR, ok with peripheral pressors.  Wife states patient has known prior documents / wishes.  Will continue with full support/ measures but in the event of cardiac arrest, No CPR Family Communication: Wife, Vickii Chafe [phone]) and daughter Jacqlyn Larsen [phone]) updated at bedside.   Disposition: Neuro ICU   Labs   CBC: Recent Labs  Lab 01/27/2019 1747 01/14/2019 1813 02/06/2019 1852  WBC 8.3  --   --   HGB 11.9* 12.6* 11.2*  HCT 37.5* 37.0* 33.0*  MCV 84.5  --   --   PLT 204  --   --     Basic Metabolic Panel: Recent Labs  Lab 02/12/2019 1747 01/29/2019 1813 01/29/2019 1852  NA 136 136 136  K 4.0 3.9 3.9  CL 103 104  --   CO2 20*  --   --   GLUCOSE 188* 191*  --   BUN 22 24*  --   CREATININE 1.15 1.00  --   CALCIUM 8.8*  --   --    GFR: Estimated Creatinine Clearance: 49.7 mL/min (by C-G formula based on SCr of 1 mg/dL). Recent Labs  Lab 01/14/2019 1747 02/02/2019 2213  WBC 8.3  --   LATICACIDVEN 2.0* 2.1*    Liver Function Tests: Recent Labs  Lab 01/28/2019 1747    AST 24  ALT 11  ALKPHOS 73  BILITOT 0.3  PROT 6.7  ALBUMIN 3.6   No results for input(s): LIPASE, AMYLASE in the last 168 hours. No results for input(s): AMMONIA in the last 168 hours.  ABG    Component Value Date/Time   PHART 7.384 02/08/2019 1852   PCO2ART 38.4 01/27/2019 1852   PO2ART 443.0 (H) 01/16/2019 1852   HCO3 23.1 02/10/2019 1852   TCO2 24 01/17/2019 1852   ACIDBASEDEF 2.0 02/08/2019 1852   O2SAT 100.0 01/21/2019 1852     Coagulation Profile: Recent Labs  Lab 01/30/2019 1747  INR 1.0    Cardiac Enzymes: No results for input(s): CKTOTAL, CKMB, CKMBINDEX, TROPONINI in the last 168 hours.  HbA1C: No results found for: HGBA1C  CBG: Recent Labs  Lab 01/20/2019 1853 01/25/2019 1927 01/19/2019 2205  GLUCAP 168* 168* 207*    Review of Systems:   Unable   Past Medical History  Chart pending for merger- MRN [id number]  Surgical History   unable  Social History    unable  Family History   His family history is not on file.   Allergies Allergies  Allergen Reactions   Iodine Shortness Of Breath and Other (See Comments)    Patient's wife stated he had a very profound reaction to this and was told by medical personnel he was to "[place]"     Home Medications  Prior to Admission medications   Medication Sig Start Date End Date Taking? Authorizing Provider  aspirin EC 81 MG tablet Take 81 mg by mouth at bedtime.   Yes [provider]  clopidogrel (PLAVIX) 75 MG tablet Take 75 mg by mouth daily.   Yes [provider]  diclofenac sodium (VOLTAREN) 1 % GEL Apply 4 g topically 4 (four) times daily as needed (to painful sites).  01/01/19  Yes [provider]  lisinopril (ZESTRIL)  10 MG tablet Take 10 mg by mouth daily. 02/02/19  Yes [provider]  meclizine (ANTIVERT) 25 MG tablet Take 25 mg by mouth 2 (two) times daily as needed for dizziness. 11/24/18  Yes [provider]  Multiple  Vitamins-Minerals (ONE-A-DAY PROACTIVE 65+) TABS Take 1 tablet by mouth daily with breakfast.   Yes [provider]  rosuvastatin (CRESTOR) 10 MG tablet Take 10 mg by mouth daily. 02/02/19  Yes [provider]  triamcinolone cream (KENALOG) 0.1 % Apply 1 application topically 2 (two) times daily as needed (for itching).  09/21/18  Yes [provider]  trimethoprim-polymyxin b (POLYTRIM) ophthalmic solution Place 1 drop into both eyes See admin instructions. INSTILL ONE DROP INTO BOTH EYES 4 TIMES A DAY FOR 2 DAYS AFTER EACH MONTHLY EYE INJECTION (both eyes) 09/05/18  Yes [provider]  metFORMIN (GLUCOPHAGE) 1000 MG tablet Take 1,000 mg by mouth 2 (two) times daily. 12/31/18   [provider]         I spent 50 minutes in direct patient care including reviewing data,  discussing with other providers, assessment, planning and stabilization and documentation. Time is exclusive to this patient and does not include procedures.    Kennieth Rad, MSN, AGACNP-BC [facility] Pgr: [phone] or if no answer [phone] 02/02/2019, 10:39 PM

## 2019-02-05 NOTE — ED Notes (Signed)
Pt taken to MRI with RN and Rt

## 2019-02-05 NOTE — ED Notes (Signed)
Bleeding noted to R AC IV prior to tpa administration

## 2019-02-06 ENCOUNTER — Inpatient Hospital Stay (HOSPITAL_COMMUNITY): Payer: Medicare Other

## 2019-02-06 DIAGNOSIS — R7989 Other specified abnormal findings of blood chemistry: Secondary | ICD-10-CM

## 2019-02-06 DIAGNOSIS — J9620 Acute and chronic respiratory failure, unspecified whether with hypoxia or hypercapnia: Secondary | ICD-10-CM

## 2019-02-06 DIAGNOSIS — I6389 Other cerebral infarction: Secondary | ICD-10-CM

## 2019-02-06 DIAGNOSIS — E785 Hyperlipidemia, unspecified: Secondary | ICD-10-CM

## 2019-02-06 DIAGNOSIS — I639 Cerebral infarction, unspecified: Secondary | ICD-10-CM

## 2019-02-06 DIAGNOSIS — K279 Peptic ulcer, site unspecified, unspecified as acute or chronic, without hemorrhage or perforation: Secondary | ICD-10-CM

## 2019-02-06 DIAGNOSIS — J9601 Acute respiratory failure with hypoxia: Secondary | ICD-10-CM

## 2019-02-06 LAB — BASIC METABOLIC PANEL
Anion gap: 14 (ref 5–15)
BUN: 25 mg/dL — ABNORMAL HIGH (ref 8–23)
CO2: 17 mmol/L — ABNORMAL LOW (ref 22–32)
Calcium: 8.6 mg/dL — ABNORMAL LOW (ref 8.9–10.3)
Chloride: 103 mmol/L (ref 98–111)
Creatinine, Ser: 1.19 mg/dL (ref 0.61–1.24)
GFR calc Af Amer: >60 mL/min (ref >60)
GFR calc non Af Amer: 53 mL/min — ABNORMAL LOW (ref >60)
Glucose, Bld: 233 mg/dL — ABNORMAL HIGH (ref 70–99)
Potassium: 4.1 mmol/L (ref 3.5–5.1)
Sodium: 134 mmol/L — ABNORMAL LOW (ref 135–145)

## 2019-02-06 LAB — GLUCOSE, CAPILLARY
Glucose-Capillary: 226 mg/dL — ABNORMAL HIGH (ref 70–99)
Glucose-Capillary: 242 mg/dL — ABNORMAL HIGH (ref 70–99)
Glucose-Capillary: 240 mg/dL — ABNORMAL HIGH (ref 70–99)
Glucose-Capillary: 203 mg/dL — ABNORMAL HIGH (ref 70–99)
Glucose-Capillary: 181 mg/dL — ABNORMAL HIGH (ref 70–99)
Glucose-Capillary: 180 mg/dL — ABNORMAL HIGH (ref 70–99)

## 2019-02-06 LAB — CBC
HCT: 40.9 % (ref 39.0–52.0)
HCT: 39.4 % (ref 39.0–52.0)
Hemoglobin: 13.2 g/dL (ref 13.0–17.0)
Hemoglobin: 13 g/dL (ref 13.0–17.0)
MCH: 27.3 pg (ref 26.0–34.0)
MCH: 27.1 pg (ref 26.0–34.0)
MCHC: 32.3 g/dL (ref 30.0–36.0)
MCHC: 33 g/dL (ref 30.0–36.0)
MCV: 84.7 fL (ref 80.0–100.0)
MCV: 82.1 fL (ref 80.0–100.0)
Platelets: 212 10*3/uL (ref 150–400)
Platelets: 233 10*3/uL (ref 150–400)
RBC: 4.83 MIL/uL (ref 4.22–5.81)
RBC: 4.8 MIL/uL (ref 4.22–5.81)
RDW: 13.9 % (ref 11.5–15.5)
RDW: 14.1 % (ref 11.5–15.5)
WBC: 16.7 10*3/uL — ABNORMAL HIGH (ref 4.0–10.5)
WBC: 14 10*3/uL — ABNORMAL HIGH (ref 4.0–10.5)
nRBC: 0 % (ref 0.0–0.2)
nRBC: 0 % (ref 0.0–0.2)

## 2019-02-06 LAB — ECHOCARDIOGRAM COMPLETE
Height: 70 in
Weight: 2606.72 oz

## 2019-02-06 LAB — POCT I-STAT 7, (LYTES, BLD GAS, ICA,H+H)
Acid-base deficit: 8 mmol/L — ABNORMAL HIGH (ref 0.0–2.0)
Bicarbonate: 17.2 mmol/L — ABNORMAL LOW (ref 20.0–28.0)
Calcium, Ion: 1.21 mmol/L (ref 1.15–1.40)
HCT: 38 % — ABNORMAL LOW (ref 39.0–52.0)
Hemoglobin: 12.9 g/dL — ABNORMAL LOW (ref 13.0–17.0)
O2 Saturation: 99 %
Patient temperature: 97.7
Potassium: 4.2 mmol/L (ref 3.5–5.1)
Sodium: 134 mmol/L — ABNORMAL LOW (ref 135–145)
TCO2: 18 mmol/L — ABNORMAL LOW (ref 22–32)
pCO2 arterial: 33.5 mmHg (ref 32.0–48.0)
pH, Arterial: 7.316 — ABNORMAL LOW (ref 7.350–7.450)
pO2, Arterial: 156 mmHg — ABNORMAL HIGH (ref 83.0–108.0)

## 2019-02-06 LAB — MAGNESIUM: Magnesium: 2.1 mg/dL (ref 1.7–2.4)

## 2019-02-06 LAB — LIPID PANEL
Cholesterol: 158 mg/dL (ref 0–200)
HDL: 42 mg/dL (ref >40)
LDL Cholesterol: 105 mg/dL — ABNORMAL HIGH (ref 0–99)
Total CHOL/HDL Ratio: 3.8 RATIO
Triglycerides: 53 mg/dL (ref <150)
VLDL: 11 mg/dL (ref 0–40)

## 2019-02-06 LAB — TROPONIN I (HIGH SENSITIVITY)
Troponin I (High Sensitivity): 717 ng/L (ref <18)
Troponin I (High Sensitivity): 680 ng/L (ref <18)
Troponin I (High Sensitivity): 619 ng/L (ref <18)

## 2019-02-06 LAB — PHOSPHORUS: Phosphorus: 3.9 mg/dL (ref 2.5–4.6)

## 2019-02-06 LAB — HEMOGLOBIN A1C
Hgb A1c MFr Bld: 7.1 % — ABNORMAL HIGH (ref 4.8–5.6)
Mean Plasma Glucose: 157.07 mg/dL

## 2019-02-06 LAB — MRSA PCR SCREENING: MRSA by PCR: NEGATIVE

## 2019-02-06 MED ORDER — PANTOPRAZOLE SODIUM 40 MG IV SOLR: 40.0000 mg | Freq: Two times a day (BID) | INTRAVENOUS | Status: DC

## 2019-02-06 MED ORDER — PRO-STAT SUGAR FREE PO LIQD: 30.0000 mL | Freq: Two times a day (BID) | ORAL | Status: DC

## 2019-02-06 MED ORDER — ROSUVASTATIN CALCIUM 20 MG PO TABS
20.0000 mg | ORAL_TABLET | Freq: Every day | ORAL | Status: DC
  Administered 2019-02-06: 20:00:00 20 mg via ORAL
  Filled 2019-02-06: qty 1

## 2019-02-06 MED ORDER — ASPIRIN 325 MG PO TABS
325.0000 mg | ORAL_TABLET | Freq: Every day | ORAL | Status: DC
  Administered 2019-02-06: 325 mg via ORAL
  Filled 2019-02-06 (×2): qty 1

## 2019-02-06 MED ORDER — SODIUM CHLORIDE 0.9 % IV SOLN
80.0000 mg | Freq: Once | INTRAVENOUS | Status: AC
  Administered 2019-02-06: 80 mg via INTRAVENOUS
  Filled 2019-02-06: qty 80

## 2019-02-06 MED ORDER — ONDANSETRON HCL 4 MG/2ML IJ SOLN: 4.0000 mg | Freq: Three times a day (TID) | INTRAMUSCULAR | Status: DC | PRN

## 2019-02-06 MED ORDER — ATORVASTATIN CALCIUM 10 MG PO TABS: 20.0000 mg | ORAL_TABLET | Freq: Every day | ORAL | Status: DC

## 2019-02-06 MED ORDER — CHLORHEXIDINE GLUCONATE CLOTH 2 % EX PADS
6.0000 | MEDICATED_PAD | Freq: Every day | CUTANEOUS | Status: DC
  Administered 2019-02-06 – 2019-02-12 (×7): 6 via TOPICAL

## 2019-02-06 MED ORDER — VITAL HIGH PROTEIN PO LIQD: 1000.0000 mL | ORAL | Status: DC

## 2019-02-06 MED ORDER — ROSUVASTATIN CALCIUM 5 MG PO TABS: 10.0000 mg | ORAL_TABLET | Freq: Every day | ORAL | Status: DC

## 2019-02-06 MED ORDER — CHLORHEXIDINE GLUCONATE 0.12% ORAL RINSE (MEDLINE KIT)
15.0000 mL | Freq: Two times a day (BID) | OROMUCOSAL | Status: DC
  Administered 2019-02-06 – 2019-02-14 (×17): 15 mL via OROMUCOSAL

## 2019-02-06 MED ORDER — SODIUM CHLORIDE 0.9 % IV BOLUS
500.0000 mL | Freq: Once | INTRAVENOUS | Status: AC
  Administered 2019-02-06: 500 mL via INTRAVENOUS

## 2019-02-06 MED ORDER — VITAL AF 1.2 CAL PO LIQD
1000.0000 mL | ORAL | Status: DC
  Administered 2019-02-06 – 2019-02-13 (×8): 1000 mL

## 2019-02-06 MED ORDER — PRO-STAT SUGAR FREE PO LIQD
30.0000 mL | Freq: Every day | ORAL | Status: DC
  Administered 2019-02-07 – 2019-02-13 (×7): 30 mL
  Filled 2019-02-06 (×7): qty 30

## 2019-02-06 MED ORDER — ORAL CARE MOUTH RINSE
15.0000 mL | OROMUCOSAL | Status: DC
  Administered 2019-02-06 – 2019-02-14 (×78): 15 mL via OROMUCOSAL

## 2019-02-06 MED ORDER — ENOXAPARIN SODIUM 40 MG/0.4ML ~~LOC~~ SOLN
40.0000 mg | SUBCUTANEOUS | Status: DC
  Administered 2019-02-06 – 2019-02-07 (×2): 40 mg via SUBCUTANEOUS
  Filled 2019-02-06 (×2): qty 0.4

## 2019-02-06 MED ORDER — SODIUM CHLORIDE 0.9 % IV SOLN
8.0000 mg/h | INTRAVENOUS | Status: DC
  Administered 2019-02-06 – 2019-02-07 (×3): 8 mg/h via INTRAVENOUS
  Filled 2019-02-06 (×3): qty 80

## 2019-02-06 MED ORDER — PERFLUTREN LIPID MICROSPHERE
1.0000 mL | INTRAVENOUS | Status: AC | PRN
  Administered 2019-02-06: 5 mL via INTRAVENOUS
  Filled 2019-02-06: qty 10

## 2019-02-06 NOTE — Progress Notes (Signed)
Patient BP readings soft. MAP of 53 at 1900 and 56 at 2000. Decreased urine output despite 500cc bolus on day shift. Dr. Jimmy Footman made aware. No new orders at this time.

## 2019-02-06 NOTE — Progress Notes (Signed)
VASCULAR LAB PRELIMINARY  PRELIMINARY  PRELIMINARY  PRELIMINARY  Carotid duplex completed.    Preliminary report:  See CV proc for preliminary results.   Abbigayle Toole, RVT 02/06/2019, 1:19 PM

## 2019-02-06 NOTE — Progress Notes (Signed)
SLP Cancellation Note  Patient Details Name: Benjamen Koelling MRN: [id number] DOB: December 07, 1924   Cancelled treatment:       Reason Eval/Treat Not Completed: Medical issues which prohibited therapy;Patient not medically ready(Pt currently on vent. SLP will follow up. )  Darly Massi I. Hardin Negus, [place], [place] Office number [phone] Pager [place] 02/06/2019, 8:32 AM

## 2019-02-06 NOTE — Procedures (Signed)
Patient Name: Garrett Henson  MRN: [id number]  Epilepsy Attending: Lora Havens  Referring Physician/Provider: Dr Kerney Elbe Date: 02/12/2019 Duration: 27.03 mins  Patient history: 85yo M presented after bring found down. EEG to evaluate for seizure.   Level of alertness: comatose  AEDs during EEG study: None  Technical aspects: This EEG study was done with scalp electrodes positioned according to the 10-20 International system of electrode placement. Electrical activity was acquired at a sampling rate of 500Hz  and reviewed with a high frequency filter of 70Hz  and a low frequency filter of 1Hz . EEG data were recorded continuously and digitally stored.   DESCRIPTION: EEG showed continuous 2-5Hz  theta-delta slowing, maximal right hemisphere. EEG ws reactive to tactile stimulation. Hyperventilation and photic stimulation were not performed.  ABNORMALITY: - Continuous slow, generalized  IMPRESSION: This study is suggestive of cortical dysfunction in right hemisphere as well as severe diffuse encephalopathy, likely sec to diffuse anoxic/hypoxic brain injury.  No seizures or epileptiform discharges were seen throughout the recording.

## 2019-02-06 NOTE — Progress Notes (Signed)
Inpatient Diabetes Program Recommendations  AACE/ADA: New Consensus Statement on Inpatient Glycemic Control (2015)  Target Ranges:  Prepandial:   less than 140 mg/dL      Peak postprandial:   less than 180 mg/dL (1-2 hours)      Critically ill patients:  140 - 180 mg/dL   Lab Results  Component Value Date   GLUCAP 242 (H) 02/06/2019   HGBA1C 7.1 (H) 02/06/2019    Review of Glycemic Control Results for CARLSON, BELLAND (MRN [id number]) as of 02/06/2019 11:11  Ref. Range 01/29/2019 18:53 01/20/2019 19:27 02/12/2019 22:05 02/06/2019 04:04 02/06/2019 07:44  Glucose-Capillary Latest Ref Range: 70 - 99 mg/dL 168 (H) 168 (H) 207 (H) 226 (H) 242 (H)   Diabetes history: DM 2 Outpatient Diabetes medications: Metformin 1000 mg bid Current orders for Inpatient glycemic control:  Novolog 2-6 units Q4 hours  Inpatient Diabetes Program Recommendations:    A1c 7.1% on 9/24  Vented. Recieved 125 Solumedrol yesterday x1 dose. NPO no tube feeds currently.   Consider Levemir 8 units Q24 hours.  Thanks,  Tama Headings RN, MSN, BC-ADM Inpatient Diabetes Coordinator Team Pager [phone] (8a-5p)

## 2019-02-06 NOTE — Progress Notes (Signed)
PCCM Interval Note   Notified by Elink/ RN that patient found with mixed bright red/ coffee ground emesis on side of face and bed.  OGT placed to suction and suctioning mouth with ~100 ml of dark bloody emesis.  S/p tPA 9/23 ~2000  No reported neurological changes.  Has remained off propofol since ER.    P:  protonix bolus then start gtt Recheck CBC Continue to monitor for further bleeding      Kennieth Rad, MSN, AGACNP-BC [facility] Pgr: [phone] or if no answer [phone] 02/06/2019, 6:46 AM

## 2019-02-06 NOTE — Progress Notes (Signed)
OT Cancellation Note  Patient Details Name: Garrett Henson MRN: [id number] DOB: June 25, 1924   Cancelled Treatment:    Reason Eval/Treat Not Completed: Patient not medically ready.  Pt intubated and sedated.  Will monitor.  Lucille Passy, OTR/L Acute Rehabilitation Services Pager [phone] Office [phone]   Lucille Passy M 02/06/2019, 6:13 AM

## 2019-02-06 NOTE — Progress Notes (Addendum)
  Echocardiogram 2D Echocardiogram with Definity has been performed.  Garrett Henson 02/06/2019, 9:42 AM

## 2019-02-06 NOTE — Progress Notes (Signed)
PT Cancellation Note  Patient Details Name: Garrett Henson MRN: [id number] DOB: 1924/06/02   Cancelled Treatment:    Reason Eval/Treat Not Completed: Active bedrest order. Please remove bedrest order when patient appropriate to participate in PT Evaluation.  Mabeline Caras, PT, DPT Acute Rehabilitation Services  Pager [phone] Office [place] 02/06/2019, 7:26 AM

## 2019-02-06 NOTE — Progress Notes (Addendum)
STROKE TEAM PROGRESS NOTE   INTERVAL HISTORY His RN is at the bedside.  Pt intubated, not responsive. Posturing BUEs on pain and withdrawing BLEs on pain. MRI showed diffuse emboli consistent with hypoperfusion. Along with dramatically elevated troponin, will consider cardiogenic source for current stroke.   Vitals:   02/06/19 0400 02/06/19 0500 02/06/19 0600 02/06/19 0630  BP: 130/77 130/67 132/72 (!) 153/82  Pulse: 93 95 95 (!) 108  Resp: (!) 4 11 13  (!) 25  Temp: 97.7 F (36.5 C) 98.2 F (36.8 C) 98.8 F (37.1 C) 98.8 F (37.1 C)  TempSrc:      SpO2: 100% 100% 100% 100%  Weight:      Height:        CBC:  Recent Labs  Lab 02/10/2019 1747  02/06/19 0105 02/06/19 0429  WBC 8.3  --  16.7*  --   HGB 11.9*   < > 13.2 12.9*  HCT 37.5*   < > 40.9 38.0*  MCV 84.5  --  84.7  --   PLT 204  --  212  --    < > = values in this interval not displayed.    Basic Metabolic Panel:  Recent Labs  Lab 01/31/2019 1747 02/01/2019 1813  01/17/2019 2213 02/06/19 0105 02/06/19 0429  NA 136 136   < >  --  134* 134*  K 4.0 3.9   < >  --  4.1 4.2  CL 103 104  --   --  103  --   CO2 20*  --   --   --  17*  --   GLUCOSE 188* 191*  --   --  233*  --   BUN 22 24*  --   --  25*  --   CREATININE 1.15 1.00  --   --  1.19  --   CALCIUM 8.8*  --   --   --  8.6*  --   MG  --   --   --  2.0  --   --   PHOS  --   --   --  2.9  --   --    < > = values in this interval not displayed.   Lipid Panel:     Component Value Date/Time   CHOL 158 02/06/2019 0105   TRIG 53 02/06/2019 0105   HDL 42 02/06/2019 0105   CHOLHDL 3.8 02/06/2019 0105   VLDL 11 02/06/2019 0105   LDLCALC 105 (H) 02/06/2019 0105   HgbA1c:  Lab Results  Component Value Date   HGBA1C 7.1 (H) 02/06/2019   Urine Drug Screen: No results found for: LABOPIA, COCAINSCRNUR, LABBENZ, AMPHETMU, THCU, LABBARB  Alcohol Level     Component Value Date/Time   ETH <10 01/31/2019 1747    IMAGING Ct Head Wo Contrast  Result Date:  01/15/2019 CLINICAL DATA:  85 year old with facial trauma. Suspected fractures. EXAM: CT HEAD WITHOUT CONTRAST TECHNIQUE: Contiguous axial images were obtained from the base of the skull through the vertex without intravenous contrast. COMPARISON:  Head CT 04/21/2011 FINDINGS: Brain: No evidence for acute hemorrhage, mass lesion, midline shift, hydrocephalus or large infarct. Mild cerebral atrophy. Mild encephalomalacia in the right parietal-occipital region may be related to prior insult or infarct. Vascular: No hyperdense vessel or unexpected calcification. Skull: Negative for calvarial fracture. Sinuses/Orbits: Fluid and mucosal disease in the sphenoid sinuses. Paranasal sinus disease in the frontal sinuses and right maxillary sinus. There appears to be some layering fluid in  the nasopharyngeal region. Evidence for endotracheal tube. Other: None IMPRESSION: 1. No acute intracranial abnormality. 2. Paranasal sinus disease particularly in the sphenoid sinus. 3. Question encephalomalacia in the right parietal-occipital region which could be related to old infarct. Electronically Signed   By: Markus Daft M.D.   On: 02/02/2019 18:41   Ct Cervical Spine Wo Contrast  Result Date: 02/08/2019 CLINICAL DATA:  85 year old found down on the front yard. Patient became unresponsive. EXAM: CT CERVICAL SPINE WITHOUT CONTRAST TECHNIQUE: Multidetector CT imaging of the cervical spine was performed without intravenous contrast. Multiplanar CT image reconstructions were also generated. COMPARISON:  None. FINDINGS: Alignment: Marked lordosis of the cervical spine. Skull base and vertebrae: No acute fracture. No primary bone lesion or focal pathologic process. Soft tissues and spinal canal: No prevertebral fluid or swelling. No visible canal hematoma. Absent or hypoplastic left thyroid tissue. Findings may be related to previous left hemithyroidectomy. No significant lymph node enlargement. Disc levels: Posterior disc space  narrowing at C5-C6 and C6-C7. Multilevel degenerative facet disease. Upper chest: Scarring at the right lung apex. Other: Endotracheal tube and orogastric tube are present. IMPRESSION: 1. No acute bone abnormality in cervical spine. 2. Prominent scarring at the right lung apex is only partially imaged. Electronically Signed   By: Markus Daft M.D.   On: 02/10/2019 18:57   Mr Angio Head Wo Contrast  Result Date: 02/09/2019 CLINICAL DATA:  Initial evaluation for acute stroke, found down. EXAM: MRI HEAD WITHOUT CONTRAST MRA HEAD WITHOUT CONTRAST TECHNIQUE: Multiplanar, multiecho pulse sequences of the brain and surrounding structures were obtained without intravenous contrast. Angiographic images of the head were obtained using MRA technique without contrast. COMPARISON:  Comparison made with prior CT from earlier the same day as well as previous MRI from 04/22/2011. FINDINGS: MRI HEAD FINDINGS Brain: Generalized age-related cerebral atrophy. Patchy and confluent T2/FLAIR hyperintensity within the periventricular deep white matter both cerebral hemispheres most consistent with chronic small vessel ischemic disease, mild for age. Few scattered remote lacunar infarcts noted within the hemispheric white matter. Small remote right PCA territory infarct involving the right parieto-occipital region noted. Associated chronic hemosiderin staining. Few tiny remote bilateral cerebellar infarcts. Extensive restricted diffusion involving predominantly the frontal and parietal lobes seen bilaterally, left slightly worse than right. Involvement is most pronounced within the cortical gray matter, although there is some patchy subcortical involvement. Ischemic change involving the bilateral cerebral hemispheres extends in a somewhat linear watershed fashion. Mild patchy involvement of the occipital poles as well. Patchy ischemic change within the thalami bilaterally. Restricted diffusion also seen involving the bilateral cerebellar  hemispheres, left slightly worse than right. Patchy involvement of the lateral right medulla. No associated hemorrhage or significant mass effect. Overall, constellation of findings felt to be most consistent with a global hypoperfusion of than within [facility] a period No mass lesion, midline shift or mass effect. No hydrocephalus. No extra-axial fluid collection. Pituitary gland suprasellar region normal. Midline structures intact. Vascular: Major intracranial vascular flow voids are maintained. Probable DVA noted within the left cerebellum. Skull and upper cervical spine: Craniocervical junction within normal limits. Upper cervical spine normal. Bone marrow signal intensity within normal limits. Right frontal scalp contusion present. Sinuses/Orbits: Patient status post bilateral ocular lens replacement. Globes and orbital soft tissues demonstrate no acute finding. Extensive seen throughout the ethmoidal air cells and sphenoid sinuses with layering fluid within the right maxillary sinus. Fluid seen within the nasopharynx. Patient is intubated. Small right mastoid effusion. Other: None. MRA HEAD FINDINGS ANTERIOR  CIRCULATION: Distal cervical segments of the internal carotid arteries are patent with antegrade flow. Petrous, cavernous, and supraclinoid ICAs patent without hemodynamically significant stenosis. A1 segments patent bilaterally. Normal anterior communicating artery. Anterior cerebral arteries patent to their distal aspects without flow-limiting stenosis. No M1 stenosis or occlusion. Negative MCA bifurcations. Distal MCA branches well perfused and symmetric. POSTERIOR CIRCULATION: Vertebral arteries patent to the vertebrobasilar junction without stenosis. Left vertebral artery dominant. Patent left PICA. Right PICA not seen. Basilar patent to its distal aspect without stenosis. Superior cerebral arteries patent bilaterally. Both of the posterior cerebral arteries primarily supplied via the basilar and are  well perfused to their distal aspects. No intracranial aneurysm. IMPRESSION: MRI HEAD IMPRESSION: 1. Extensive ischemic changes involving the bilateral cerebral and cerebellar hemispheres, as well as involvement of the bilateral thalami and right lateral medulla. Constellation of findings felt to be most consistent with hypoperfusion injury/cerebral anoxic event. No associated hemorrhage or significant mass effect. 2. Underlying age-related cerebral atrophy with mild chronic small vessel ischemic disease, with additional chronic ischemic changes as above. MRA HEAD IMPRESSION: Negative intracranial MRA with wide patency of the large and medium sized vessels of the intracranial circulation. No hemodynamically significant or correctable stenosis. Electronically Signed   By: Jeannine Boga M.D.   On: 01/23/2019 22:08   Mr Brain Wo Contrast  Result Date: 01/16/2019 CLINICAL DATA:  Initial evaluation for acute stroke, found down. EXAM: MRI HEAD WITHOUT CONTRAST MRA HEAD WITHOUT CONTRAST TECHNIQUE: Multiplanar, multiecho pulse sequences of the brain and surrounding structures were obtained without intravenous contrast. Angiographic images of the head were obtained using MRA technique without contrast. COMPARISON:  Comparison made with prior CT from earlier the same day as well as previous MRI from 04/22/2011. FINDINGS: MRI HEAD FINDINGS Brain: Generalized age-related cerebral atrophy. Patchy and confluent T2/FLAIR hyperintensity within the periventricular deep white matter both cerebral hemispheres most consistent with chronic small vessel ischemic disease, mild for age. Few scattered remote lacunar infarcts noted within the hemispheric white matter. Small remote right PCA territory infarct involving the right parieto-occipital region noted. Associated chronic hemosiderin staining. Few tiny remote bilateral cerebellar infarcts. Extensive restricted diffusion involving predominantly the frontal and parietal lobes  seen bilaterally, left slightly worse than right. Involvement is most pronounced within the cortical gray matter, although there is some patchy subcortical involvement. Ischemic change involving the bilateral cerebral hemispheres extends in a somewhat linear watershed fashion. Mild patchy involvement of the occipital poles as well. Patchy ischemic change within the thalami bilaterally. Restricted diffusion also seen involving the bilateral cerebellar hemispheres, left slightly worse than right. Patchy involvement of the lateral right medulla. No associated hemorrhage or significant mass effect. Overall, constellation of findings felt to be most consistent with a global hypoperfusion of than within [facility] E a period No mass lesion, midline shift or mass effect. No hydrocephalus. No extra-axial fluid collection. Pituitary gland suprasellar region normal. Midline structures intact. Vascular: Major intracranial vascular flow voids are maintained. Probable DVA noted within the left cerebellum. Skull and upper cervical spine: Craniocervical junction within normal limits. Upper cervical spine normal. Bone marrow signal intensity within normal limits. Right frontal scalp contusion present. Sinuses/Orbits: Patient status post bilateral ocular lens replacement. Globes and orbital soft tissues demonstrate no acute finding. Extensive seen throughout the ethmoidal air cells and sphenoid sinuses with layering fluid within the right maxillary sinus. Fluid seen within the nasopharynx. Patient is intubated. Small right mastoid effusion. Other: None. MRA HEAD FINDINGS ANTERIOR CIRCULATION: Distal cervical segments of the internal  carotid arteries are patent with antegrade flow. Petrous, cavernous, and supraclinoid ICAs patent without hemodynamically significant stenosis. A1 segments patent bilaterally. Normal anterior communicating artery. Anterior cerebral arteries patent to their distal aspects without flow-limiting stenosis. No M1  stenosis or occlusion. Negative MCA bifurcations. Distal MCA branches well perfused and symmetric. POSTERIOR CIRCULATION: Vertebral arteries patent to the vertebrobasilar junction without stenosis. Left vertebral artery dominant. Patent left PICA. Right PICA not seen. Basilar patent to its distal aspect without stenosis. Superior cerebral arteries patent bilaterally. Both of the posterior cerebral arteries primarily supplied via the basilar and are well perfused to their distal aspects. No intracranial aneurysm. IMPRESSION: MRI HEAD IMPRESSION: 1. Extensive ischemic changes involving the bilateral cerebral and cerebellar hemispheres, as well as involvement of the bilateral thalami and right lateral medulla. Constellation of findings felt to be most consistent with hypoperfusion injury/cerebral anoxic event. No associated hemorrhage or significant mass effect. 2. Underlying age-related cerebral atrophy with mild chronic small vessel ischemic disease, with additional chronic ischemic changes as above. MRA HEAD IMPRESSION: Negative intracranial MRA with wide patency of the large and medium sized vessels of the intracranial circulation. No hemodynamically significant or correctable stenosis. Electronically Signed   By: Jeannine Boga M.D.   On: 01/20/2019 22:08   Dg Pelvis Portable  Result Date: 02/04/2019 CLINICAL DATA:  Trauma. The patient was found down in his yard. Altered mental status. EXAM: PORTABLE PELVIS 1-2 VIEWS COMPARISON:  CT scan dated 12/31/2018 FINDINGS: There is no evidence of pelvic fracture or diastasis. No pelvic bone lesions are seen. Hardware in both hips. The previous fractures have completely healed. IMPRESSION: No acute abnormalities. Electronically Signed   By: Lorriane Shire M.D.   On: 02/06/2019 18:19   Dg Chest Port 1 View  Result Date: 01/26/2019 CLINICAL DATA:  Trauma. The patient was found down. Altered mental status. EXAM: PORTABLE CHEST 1 VIEW COMPARISON:  12/31/2018  FINDINGS: Endotracheal tube is 6 cm above the carina. NG tube tip is in the body of the stomach. Heart size and pulmonary vascularity are normal. No infiltrates or effusions. Minimal chronic thickening of the right minor fissure. Minimal scarring at the right lung base. IMPRESSION: 1. Endotracheal tube is 6 cm above the carina. 2. No acute abnormalities in the chest. 3.  Aortic Atherosclerosis (ICD10-I70.0). Electronically Signed   By: Lorriane Shire M.D.   On: 01/27/2019 18:21   Ct Maxillofacial Wo Contrast  Result Date: 02/08/2019 CLINICAL DATA:  Facial trauma. EXAM: CT MAXILLOFACIAL WITHOUT CONTRAST TECHNIQUE: Multidetector CT imaging of the maxillofacial structures was performed. Multiplanar CT image reconstructions were also generated. COMPARISON:  Head CT 02/11/2019 FINDINGS: Osseous: Patient is edentulous. Mandible is intact. Zygomatic arches are intact. Pterygoid plates appear to be intact. No evidence for acute facial bone fracture. Orbits: Negative. No traumatic or inflammatory finding. Sinuses: Air and fluid within the sphenoid sinuses. Mild mucosal disease in the frontal sinuses and maxillary sinuses. Soft tissues: No focal soft tissue abnormality. There is an endotracheal tube and evidence for orogastric tube. Limited intracranial: No acute abnormality. IMPRESSION: 1. No acute facial bone fracture. 2. Gas and fluid in the sphenoid sinus may be related to recent intubation. Mild paranasal sinus disease. Electronically Signed   By: Markus Daft M.D.   On: 02/03/2019 18:50    PHYSICAL EXAM  Temp:  [94.5 F (34.7 C)-100 F (37.8 C)] 99.6 F (37.6 C) (09/24 1800) Pulse Rate:  [55-108] 95 (09/24 1830) Resp:  [0-25] 15 (09/24 1830) BP: (81-185)/(40-104) 112/61 (09/24 1830) SpO2:  [  99 %-100 %] 100 % (09/24 1830) FiO2 (%):  [40 %-100 %] 40 % (09/24 1600) Weight:  [73.9 kg-74.4 kg] 73.9 kg (09/24 0035)  General - Well nourished, well developed, intubated on sedation.  Ophthalmologic - fundi  not visualized due to noncooperation.  Cardiovascular - Regular rate and rhythm.  Neuro - intubated off sedation, eyes closed, not following commands. With forced eye opening, eyes in mid position, not blinking to visual threat, doll's eyes absent, not tracking, PERRL. Corneal reflex present on the left but not on the right, gag and cough present. Breathing over the vent.  Facial symmetry not able to test due to ET tube.  Tongue protrusion not cooperative. On pain stimulation, BUE extension posturing and BLE 2/5 withdraw. DTR 1+ and no babinski. Sensation, coordination and gait not tested.   ASSESSMENT/PLAN Garrett Henson is a 85 y.o. male with history of HTN, HLD, CAD, prostate cancer s/p resection and non-small cell lung cancer found down a&o x 4 and became unresponsive with R gaze deviation. Intubated in ED. Received tPA 02/04/2019 at [place].    Stroke: bilaterally diffuse supra- and infratentorial infarcts - pattern consistent with cerebral hypoperfusion with hypoxic ischemic encephalopathy s/p IV tPA, concerning for cardiac source   CT head No acute abnormality. R parietal / occipital encephalomalacia.   MRI  Extensive ischemic changes B cerebral and cerebellar hemispheres, B thalami and R lateral ventricle c/w anoxic brain injury/hypoperfusion.   MRA  Unremarkable   Carotid Doppler unremarkable  2D Echo EF 60-65%  CT head pending at 24h of tPA   EEG no seizure, cortical dysfunction in right hemisphere as well as severe diffuse encephalopathy, likely sec to diffuse anoxic/hypoxic brain injury.  LDL 105  HgbA1c 7.1  SCDs for VTE prophylaxis  aspirin 81 mg daily and clopidogrel 75 mg daily prior to admission, now on No antithrombotic as within 24h of tPA administration.    Therapy recommendations:  pending   Disposition:  pending   NSTEMI vs. ? Cardiac arrest  EKG no STEMI  Troponin 789->717->680  Troponin series  MRI stroke pattern concerning for global  hypoperfusion  EF 60-65%  Recommend cardiology consultation - discussed with CCM  Stress ulcer  Large amount of coffee ground emesis from OG  On PPI infusion  Hb stable  Close monitoring  Respiratory failure  Intubated for airway protection  On weaning trial  Wean off ventilation as able  CCM on board  Hypertension  Stable  Initially treated with cleviprex, now off BP goal < 180/105 x 24h following tPA administration  Hyperlipidemia  Home meds:  crestor 10  LDL 105, goal < 70  Increase crestor to 20  Continue statin at discharge  Diabetes type II Uncontrolled  HgbA1c 7.1, goal < 7.0  CBGs  SSI  Dysphagia . Secondary to cerebral event  . NPO . 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 This patient's care requires constant monitoring of vital signs, hemodynamics, respiratory and cardiac monitoring, review of multiple databases, neurological assessment, discussion with family, other specialists and medical decision making  of high complexity. I spent 40 minutes of neurocritical care time in the care of this patient.  Discussed with Dr. Lynetta Mare and CCM team.  Rosalin Hawking, MD PhD Stroke Neurology 02/06/2019 7:01 PM    To contact Stroke Continuity provider, please refer to [email or web address] After hours, contact General Neurology

## 2019-02-06 NOTE — Progress Notes (Signed)
Patient was transported to CT & back to 4N24 without any complications.

## 2019-02-06 NOTE — Progress Notes (Addendum)
PT Cancellation Note  Patient Details Name: Garrett Henson MRN: [id number] DOB: 03-21-1925   Cancelled Treatment:    Reason Eval/Treat Not Completed: Active bedrest order;Medical issues which prohibited therapy.  PT to check back tomorrow.  Thanks,  Barbarann Ehlers. Camellia Popescu, PT, DPT  Acute Rehabilitation [phone] pager [phone] office  @ [facility] [phone]     Harvie Heck 02/06/2019, 11:45 AM

## 2019-02-06 NOTE — Progress Notes (Signed)
Overnight cross cover note Received sign out of patient being seen for concern of posterior circulation stroke s/p tPA RN called - some bleeding in mouth. No blood when suctioning NG tube. Patient seen in ER. Continues to be intubated. Some blood in mouth, not from suctioning. Does not follow commands, on propofol No gaze deviation. Triple flexes to nox stim in b/l LE. To nox stim in UE, has flexor response. MRI brain and MRA head completed. Extensive ischemic changes in supra and infratentorial areas bilaterally -?hypotension/hypoperfusion. Formal report below  IMPRESSION: MRI HEAD IMPRESSION: 1. Extensive ischemic changes involving the bilateral cerebral and cerebellar hemispheres, as well as involvement of the bilateral thalami and right lateral medulla. Constellation of findings felt to be most consistent with hypoperfusion injury/cerebral anoxic event. No associated hemorrhage or significant mass effect. 2. Underlying age-related cerebral atrophy with mild chronic small vessel ischemic disease, with additional chronic ischemic changes as above.  MRA HEAD IMPRESSION: Negative intracranial MRA with wide patency of the large and medium sized vessels of the intracranial circulation. No hemodynamically significant or correctable stenosis.  Labs with elevated trops  A: Stroke v Hypoxic ischemic encephalopathy Consider possible cardiac etiology as cause of the presentation rather than posterior circ stroke given imaging findings available now. Less likely seizure given imaging findings that are not consistent with seizure but rather HIE  Recs: Frequent neurochecks per tPA protocol Continue supportive care per PCCM/ICU team once in ICU. No need for tPA reversal as no evidence of ICH on MRI that was done after tPA. Local bleeding is not significant enough to reverse tPA Management of troponinemia per PCCM D/w Dr. Jimmy Footman - eICU attending, over phone.  -- Amie Portland,  MD [facility] Pager: [phone] If 7pm to 7am, please call on call as listed on AMION.  CRITICAL CARE ATTESTATION Performed by: Amie Portland, MD Additional critical care time: 20 minutes Critical care time was exclusive of separately billable procedures and treating other patients and/or supervising APPs/Residents/Students Critical care was necessary to treat or prevent imminent or life-threatening deterioration due to ischemic encephalopathy This patient is critically ill and at significant risk for neurological worsening and/or death and care requires constant monitoring. Critical care was time spent personally by me on the following activities: development of treatment plan with patient and/or surrogate as well as nursing, discussions with consultants, evaluation of patient's response to treatment, examination of patient, obtaining history from patient or surrogate, ordering and performing treatments and interventions, ordering and review of laboratory studies, ordering and review of radiographic studies, pulse oximetry, re-evaluation of patient's condition, participation in multidisciplinary rounds and medical decision making of high complexity in the care of this patient.

## 2019-02-06 NOTE — Progress Notes (Signed)
EEG complete - results pending 

## 2019-02-06 NOTE — Progress Notes (Signed)
Initial Nutrition Assessment  DOCUMENTATION CODES:   Not applicable  INTERVENTION:   Initiate Vital AF 1.2 @ 55 ml/hr via OG tube 30 ml Prostat daily  Provides: 1684 kcal, 114 grams protein, and 1070 ml free water.    NUTRITION DIAGNOSIS:   Inadequate oral intake related to inability to eat as evidenced by NPO status.  GOAL:   Patient will meet greater than or equal to 90% of their needs  MONITOR:   TF tolerance  REASON FOR ASSESSMENT:   Consult, Ventilator Enteral/tube feeding initiation and management  ASSESSMENT:   Pt with PMH of HTN, DM, GERD, CAD, HLD, s/p wedge resection of non-small cell stage I ca from RLL now admitted for multifocal CVA's.   Patient is currently intubated on ventilator support MV: 9.6 L/min Temp (24hrs), Avg:96.3 F (35.7 C), Min:94.5 F (34.7 C), Max:99.5 F (37.5 C)  Propofol and cleviprex are off Medications reviewed and include: 2-6 units novolog every 4 hours Labs reviewed:  CBG: 240    NUTRITION - FOCUSED PHYSICAL EXAM:  Deferred   Diet Order:   Diet Order            Diet NPO time specified  Diet effective now              EDUCATION NEEDS:   No education needs have been identified at this time  Skin:  Skin Assessment: Reviewed RN Assessment  Last BM:  unknown  Height:   Ht Readings from Last 1 Encounters:  02/06/19 5\' 10"  (1.778 m)    Weight:   Wt Readings from Last 1 Encounters:  02/06/19 73.9 kg    Ideal Body Weight:  75.4 kg  BMI:  Body mass index is 23.38 kg/m.  Estimated Nutritional Needs:   Kcal:  1699  Protein:  90-115 grams  Fluid:  >1.6 L/day  Maylon Peppers RD, LDN, CNSC [phone] Pager [phone] After Hours Pager

## 2019-02-06 NOTE — Progress Notes (Signed)
NAME:  Garrett Henson, MRN:  [id number], DOB:  Sep 28, 1924, LOS: 1 ADMISSION DATE:  01/21/2019, CONSULTATION DATE:  01/23/2019 REFERRING MD:  Dr. Cheral Marker, CHIEF COMPLAINT:  AMS  Brief History   30 yoM presenting from home after being found down in yard presenting as code stroke with altered mental status.  Rapid decline in mental status with EMS.  LSW 1645.  Required intubation on arrival to ER for airway protection with GCS 3 and right gaze.  CTH neg.  tPA given.  CTA deferred due to iodine allergy.  Taken for stat MRI.  Neurology consulting, PCCM asked to admit.   History of present illness   HPI obtained from medical chart review as patient is intubated and sedated.  Chart pending merger with MRN [id number].    85 year old male with prior history of HTN, DM, GERD, CAD on ASA/ plavix, HLD and wedge resection of non-small cell stage I a carcinoma the lung from the right lower lobe presenting in 2017.   Reportedly working outside in his yard today in his normal state of health when found down in yard by his wife after doing yard work.  Last known well 1615.  Found by EMS awake and able to answer questions with some facial abrasions.  Had rapid decline in mental status with EMS.  Upon arrival to ER, GCS declined to 3 with rightward fixed gaze and required intubation for airway protection. No seizure activity reported.  CTH negative.  CT maxiofacial, cervical, and pelvis XR negative for trauma workup.  Was hypertensive 184/83 treated with IV labetalol.  Labs noted for glucose 188, LA 2, Hgb 11.9, and normal coags. CXR with satisfactory ETT placement and without acute process.  EKG non-acute, SR but abnormal Rwave changes, minimal ST depression.  Due to prior severe iodine allergy, CT with contrast deferred.  Neurology concerned for possibility of basilar artery thrombosis with acute brainstem stroke, therefore patient given tPA.  Patient taken for emergent MRI, Neurology consulting and PCCM to admit.    Past Medical History   Past Medical History:  Diagnosis Date   Arthritis    Back pain    Coronary artery disease    takes Plavix daily   Diabetes mellitus    takes Metformin daily   GERD (gastroesophageal reflux disease)    takes tagamet daily   Headache    HX MIGRAINES   History of blood transfusion    no abnormal reaction noted   History of colon polyps    benign   History of migraine    many yrs ago   Hyperlipidemia    takes Crestor daily   Hypertension    takes Amlodipine daily   Joint swelling    Lung mass 12/01/2014   Lung mass 12/01/2014   Macular degeneration    wet and on the left;injection 01/20/15   Nocturia    Overactive bladder    takes Myrbetriq daily   Prostate cancer ([place])    Stroke ([place])    TIA (transient ischemic attack)    Urinary frequency    Urinary urgency    Vertigo    takes Antivert daily as needed        Patient Active Problem List   Diagnosis Date Noted   Preop cardiovascular exam 03/02/2015   Cerebrovascular disease 03/02/2015   Hyperlipidemia 03/02/2015   Lung cancer ([place]) 01/26/2015   DM2 (diabetes mellitus, type 2) ([place]) 04/23/2011   CAD (coronary artery disease) 04/23/2011   Stroke ([place]) 04/22/2011  Chest pain 04/22/2011   Hypertension     Significant Hospital Events   9/23 Admit  Consults:  Neurology  Procedures:  9/23 ETT >>  Significant Diagnostic Tests:  9/23 CTH > 1. No acute intracranial abnormality. 2. Paranasal sinus disease particularly in the sphenoid sinus. 3. Question encephalomalacia in the right parietal-occipital region which could be related to old infarct. CT maxillofacial 9/23 > 1. No acute facial bone fracture. 2. Gas and fluid in the sphenoid sinus may be related to recent intubation. Mild paranasal sinus disease. CT cervical 9/23 > 1. No acute bone abnormality in cervical spine. 2. Prominent scarring at the right lung apex is  only partially imaged. 9/23 Pelvis XR > neg 9/23 MRI brain / angio > Negative intracranial MRA with wide patency of the large and medium sized vessels of the intracranial circulation. No hemodynamically significant or correctable stenosis.  Micro Data:  9/23 SARS Cov2 > neg  Antimicrobials:   Interim history/subjective:  Remains off sedation. Events overnight as above   Objective   Blood pressure (!) 104/53, pulse 80, temperature 99.3 F (37.4 C), resp. rate (!) 7, height 5\' 10"  (1.778 m), weight 73.9 kg, SpO2 100 %.    Vent Mode: PSV;CPAP FiO2 (%):  [40 %-100 %] 40 % Set Rate:  [14 bmp-16 bmp] 16 bmp Vt Set:  [480 mL-580 mL] 480 mL PEEP:  [5 cmH20] 5 cmH20 Pressure Support:  [10 cmH20] 10 cmH20 Plateau Pressure:  [14 cmH20-18 cmH20] 14 cmH20   Intake/Output Summary (Last 24 hours) at 02/06/2019 1012 Last data filed at 02/06/2019 0900 Gross per 24 hour  Intake 1048.48 ml  Output 295 ml  Net 753.48 ml   Filed Weights   01/26/2019 1900 01/18/2019 1935 02/06/19 0035  Weight: 74.4 kg 74.4 kg 73.9 kg    Examination: General:  Elderly male on vent  HEENT: ETT/OG in place, dark brown output noted in og tube Neuro:  +cough/+gag, pupils intact, withdrawals right upper/lower, left lower, flicker noted to left upper  CV: arrhythmia, a flutter, no MRG  PULM:  Clear breath sounds, no wheeze/craclkes  GI: foley present  Extremities: -edema  Skin: no rashes, skin bruising to bilateral arms- wrapped   Resolved Hospital Problem list    Assessment & Plan:   Acute respiratory insufficiency related to acute encephalopathy -Remains off sedation  P:  Vent Support Pulmonary hygiene  trend CXR/ ABG as needed VAP bundle Daily SBT/ WUA  Acute encephalopathy MRI with extensive ischemic changes in bilateral cerebral and cerebellar hemispheres with involvement of bilateral thalami and right lateral medulla concerning for hypoxia vs hypoperfusion ischemia  - CTH neg, ETOH neg - unable to  do CTA due to iodine allergy  P:  Neurology Following  -Repeat Head CT 6 pm  Frequent Neuro Checks  EEG pending  PAD Protocol with propofol/ prn fentanyl for RASS goal 0-/1 PT/OT/ SLP when appropriate   HTN Troponinemia secondary to demand ischemia  Plan:  Tele monitoring Cleviprex/ Prn labetalol for SBP goal post tPA <180 ECHO results pending  Troponin Down-trended  Continue home lisinopril   HLD Plan:  Cholesterol 158, Tri 53, HDL 42, LDL 105   DM -HAIC 7.1  Plan:  Trend Glucose, SSI   Leukocytosis, Presumed reactive  Plan  Trend WBC and Fever Curve   Noted bleeding via OG tube s/p TPA  Plan -Trend CBC  -Transfuse for hemoglobin <7 -Continue PPI gtt   Best practice:  Diet: NPO Pain/Anxiety/Delirium protocol (if indicated): propofol/ prn  fentanyl VAP protocol (if indicated): yes DVT prophylaxis: s/p tPA GI prophylaxis: PPI Glucose control: SSI Mobility: BR Code Status: Limited- NO CPR, ok with peripheral pressors.  Wife states patient has known prior documents / wishes.  Will continue with full support/ measures but in the event of cardiac arrest, No CPR Family Communication: Wife, Vickii Chafe [phone]) and daughter Jacqlyn Larsen [phone]) updated at bedside.   Disposition: Neuro ICU   Labs   CBC: Recent Labs  Lab 02/03/2019 1747 02/02/2019 1813 01/31/2019 1852 02/06/19 0105 02/06/19 0429  WBC 8.3  --   --  16.7*  --   HGB 11.9* 12.6* 11.2* 13.2 12.9*  HCT 37.5* 37.0* 33.0* 40.9 38.0*  MCV 84.5  --   --  84.7  --   PLT 204  --   --  212  --     Basic Metabolic Panel: Recent Labs  Lab 01/29/2019 1747 01/15/2019 1813 02/04/2019 1852 01/21/2019 2213 02/06/19 0105 02/06/19 0429  NA 136 136 136  --  134* 134*  K 4.0 3.9 3.9  --  4.1 4.2  CL 103 104  --   --  103  --   CO2 20*  --   --   --  17*  --   GLUCOSE 188* 191*  --   --  233*  --   BUN 22 24*  --   --  25*  --   CREATININE 1.15 1.00  --   --  1.19  --   CALCIUM 8.8*  --   --   --  8.6*  --   MG  --    --   --  2.0  --   --   PHOS  --   --   --  2.9  --   --    GFR: Estimated Creatinine Clearance: 41.7 mL/min (by C-G formula based on SCr of 1.19 mg/dL). Recent Labs  Lab 02/10/2019 1747 02/10/2019 2213 02/06/19 0105  WBC 8.3  --  16.7*  LATICACIDVEN 2.0* 2.1*  --     Liver Function Tests: Recent Labs  Lab 01/19/2019 1747  AST 24  ALT 11  ALKPHOS 73  BILITOT 0.3  PROT 6.7  ALBUMIN 3.6   No results for input(s): LIPASE, AMYLASE in the last 168 hours. No results for input(s): AMMONIA in the last 168 hours.  ABG    Component Value Date/Time   PHART 7.316 (L) 02/06/2019 0429   PCO2ART 33.5 02/06/2019 0429   PO2ART 156.0 (H) 02/06/2019 0429   HCO3 17.2 (L) 02/06/2019 0429   TCO2 18 (L) 02/06/2019 0429   ACIDBASEDEF 8.0 (H) 02/06/2019 0429   O2SAT 99.0 02/06/2019 0429     Coagulation Profile: Recent Labs  Lab 01/16/2019 1747  INR 1.0    Cardiac Enzymes: No results for input(s): CKTOTAL, CKMB, CKMBINDEX, TROPONINI in the last 168 hours.  HbA1C: Hgb A1c MFr Bld  Date/Time Value Ref Range Status  02/06/2019 01:05 AM 7.1 (H) 4.8 - 5.6 % Final    Comment:    (NOTE) Pre diabetes:          5.7%-6.4% Diabetes:              >6.4% Glycemic control for   <7.0% adults with diabetes     CBG: Recent Labs  Lab 01/22/2019 1853 01/31/2019 1927 02/10/2019 2205 02/06/19 0404 02/06/19 0744  GLUCAP 168* 168* 207* 226* 242*    Review of Systems:   Unable   Past Medical History  Chart pending for merger- MRN [id number]  Surgical History   unable  Social History    unable  Family History   His family history is not on file.   Allergies Allergies  Allergen Reactions   Iodine Shortness Of Breath and Other (See Comments)    Patient's wife stated he had a very profound reaction to this and was told by medical personnel he was to "[place]"     Home Medications  Prior to Admission medications   Medication Sig Start Date End Date Taking? Authorizing  Provider  aspirin EC 81 MG tablet Take 81 mg by mouth at bedtime.   Yes [provider]  clopidogrel (PLAVIX) 75 MG tablet Take 75 mg by mouth daily.   Yes [provider]  diclofenac sodium (VOLTAREN) 1 % GEL Apply 4 g topically 4 (four) times daily as needed (to painful sites).  01/01/19  Yes [provider]  lisinopril (ZESTRIL) 10 MG tablet Take 10 mg by mouth daily. 02/02/19  Yes [provider]  meclizine (ANTIVERT) 25 MG tablet Take 25 mg by mouth 2 (two) times daily as needed for dizziness. 11/24/18  Yes [provider]  Multiple Vitamins-Minerals (ONE-A-DAY PROACTIVE 65+) TABS Take 1 tablet by mouth daily with breakfast.   Yes [provider]  rosuvastatin (CRESTOR) 10 MG tablet Take 10 mg by mouth daily. 02/02/19  Yes [provider]  triamcinolone cream (KENALOG) 0.1 % Apply 1 application topically 2 (two) times daily as needed (for itching).  09/21/18  Yes [provider]  trimethoprim-polymyxin b (POLYTRIM) ophthalmic solution Place 1 drop into both eyes See admin instructions. INSTILL ONE DROP INTO BOTH EYES 4 TIMES A DAY FOR 2 DAYS AFTER EACH MONTHLY EYE INJECTION (both eyes) 09/05/18  Yes [provider]  metFORMIN (GLUCOPHAGE) 1000 MG tablet Take 1,000 mg by mouth 2 (two) times daily. 12/31/18   [provider]         I spent 33 minutes in direct patient care including reviewing data,  discussing with other providers, assessment, planning and stabilization and documentation. Time is exclusive to this patient and does not include procedures.    Hayden Pedro, AGACNP-BC [facility]  Pgr: [phone]  PCCM Pgr: [phone]

## 2019-02-07 ENCOUNTER — Inpatient Hospital Stay (HOSPITAL_COMMUNITY): Payer: Medicare Other

## 2019-02-07 DIAGNOSIS — D72829 Elevated white blood cell count, unspecified: Secondary | ICD-10-CM

## 2019-02-07 DIAGNOSIS — I639 Cerebral infarction, unspecified: Secondary | ICD-10-CM

## 2019-02-07 DIAGNOSIS — N179 Acute kidney failure, unspecified: Secondary | ICD-10-CM

## 2019-02-07 LAB — BASIC METABOLIC PANEL
Anion gap: 12 (ref 5–15)
Anion gap: 10 (ref 5–15)
BUN: 47 mg/dL — ABNORMAL HIGH (ref 8–23)
BUN: 60 mg/dL — ABNORMAL HIGH (ref 8–23)
CO2: 17 mmol/L — ABNORMAL LOW (ref 22–32)
CO2: 14 mmol/L — ABNORMAL LOW (ref 22–32)
Calcium: 7.9 mg/dL — ABNORMAL LOW (ref 8.9–10.3)
Calcium: 7.7 mg/dL — ABNORMAL LOW (ref 8.9–10.3)
Chloride: 106 mmol/L (ref 98–111)
Chloride: 113 mmol/L — ABNORMAL HIGH (ref 98–111)
Creatinine, Ser: 2.23 mg/dL — ABNORMAL HIGH (ref 0.61–1.24)
Creatinine, Ser: 2.22 mg/dL — ABNORMAL HIGH (ref 0.61–1.24)
GFR calc Af Amer: 29 mL/min — ABNORMAL LOW (ref >60)
GFR calc Af Amer: 29 mL/min — ABNORMAL LOW (ref >60)
GFR calc non Af Amer: 25 mL/min — ABNORMAL LOW (ref >60)
GFR calc non Af Amer: 25 mL/min — ABNORMAL LOW (ref >60)
Glucose, Bld: 243 mg/dL — ABNORMAL HIGH (ref 70–99)
Glucose, Bld: 257 mg/dL — ABNORMAL HIGH (ref 70–99)
Potassium: 4.7 mmol/L (ref 3.5–5.1)
Potassium: 4.5 mmol/L (ref 3.5–5.1)
Sodium: 135 mmol/L (ref 135–145)
Sodium: 137 mmol/L (ref 135–145)

## 2019-02-07 LAB — CBC
HCT: 33.9 % — ABNORMAL LOW (ref 39.0–52.0)
Hemoglobin: 11.2 g/dL — ABNORMAL LOW (ref 13.0–17.0)
MCH: 27.4 pg (ref 26.0–34.0)
MCHC: 33 g/dL (ref 30.0–36.0)
MCV: 82.9 fL (ref 80.0–100.0)
Platelets: 203 10*3/uL (ref 150–400)
RBC: 4.09 MIL/uL — ABNORMAL LOW (ref 4.22–5.81)
RDW: 14.8 % (ref 11.5–15.5)
WBC: 23.6 10*3/uL — ABNORMAL HIGH (ref 4.0–10.5)
nRBC: 0 % (ref 0.0–0.2)

## 2019-02-07 LAB — MAGNESIUM
Magnesium: 2 mg/dL (ref 1.7–2.4)
Magnesium: 2.1 mg/dL (ref 1.7–2.4)

## 2019-02-07 LAB — GLUCOSE, CAPILLARY
Glucose-Capillary: 197 mg/dL — ABNORMAL HIGH (ref 70–99)
Glucose-Capillary: 255 mg/dL — ABNORMAL HIGH (ref 70–99)
Glucose-Capillary: 244 mg/dL — ABNORMAL HIGH (ref 70–99)
Glucose-Capillary: 219 mg/dL — ABNORMAL HIGH (ref 70–99)
Glucose-Capillary: 216 mg/dL — ABNORMAL HIGH (ref 70–99)
Glucose-Capillary: 237 mg/dL — ABNORMAL HIGH (ref 70–99)

## 2019-02-07 LAB — NA AND K (SODIUM & POTASSIUM), RAND UR
Potassium Urine: 71 mmol/L
Sodium, Ur: 28 mmol/L

## 2019-02-07 LAB — PHOSPHORUS
Phosphorus: 3.4 mg/dL (ref 2.5–4.6)
Phosphorus: 2.4 mg/dL — ABNORMAL LOW (ref 2.5–4.6)

## 2019-02-07 LAB — TROPONIN I (HIGH SENSITIVITY)
Troponin I (High Sensitivity): 543 ng/L (ref <18)
Troponin I (High Sensitivity): 466 ng/L (ref <18)

## 2019-02-07 LAB — CK: Total CK: 287 U/L (ref 49–397)

## 2019-02-07 MED ORDER — ALBUMIN HUMAN 25 % IV SOLN
12.5000 g | Freq: Once | INTRAVENOUS | Status: AC
  Administered 2019-02-07: 12.5 g via INTRAVENOUS
  Filled 2019-02-07: qty 50

## 2019-02-07 MED ORDER — INSULIN GLARGINE 100 UNIT/ML ~~LOC~~ SOLN
20.0000 [IU] | Freq: Two times a day (BID) | SUBCUTANEOUS | Status: DC
  Administered 2019-02-07 – 2019-02-11 (×10): 20 [IU] via SUBCUTANEOUS
  Filled 2019-02-07 (×12): qty 0.2

## 2019-02-07 MED ORDER — ROSUVASTATIN CALCIUM 20 MG PO TABS
20.0000 mg | ORAL_TABLET | Freq: Every day | ORAL | Status: DC
  Administered 2019-02-07 – 2019-02-11 (×5): 20 mg
  Filled 2019-02-07 (×4): qty 1

## 2019-02-07 MED ORDER — PANTOPRAZOLE SODIUM 40 MG PO PACK
40.0000 mg | PACK | Freq: Every day | ORAL | Status: DC
  Administered 2019-02-07 – 2019-02-13 (×7): 40 mg
  Filled 2019-02-07 (×7): qty 20

## 2019-02-07 MED ORDER — ASPIRIN 325 MG PO TABS
325.0000 mg | ORAL_TABLET | Freq: Every day | ORAL | Status: DC
  Administered 2019-02-07 – 2019-02-13 (×7): 325 mg
  Filled 2019-02-07 (×6): qty 1

## 2019-02-07 MED ORDER — SODIUM CHLORIDE 0.9 % IV BOLUS
500.0000 mL | Freq: Once | INTRAVENOUS | Status: AC
  Administered 2019-02-07: 500 mL via INTRAVENOUS

## 2019-02-07 MED ORDER — SODIUM CHLORIDE 0.9 % IV BOLUS
250.0000 mL | Freq: Once | INTRAVENOUS | Status: AC
  Administered 2019-02-07: 04:00:00 250 mL via INTRAVENOUS

## 2019-02-07 NOTE — Progress Notes (Signed)
STROKE TEAM PROGRESS NOTE   INTERVAL HISTORY Pt wife and daughter at bedside. Pt still intubated, not open eyes on voice or pain. BUE posturing improved from yesterday, still withdraw of BLEs from pain.    Vitals:   02/07/19 0500 02/07/19 0600 02/07/19 0700 02/07/19 0820  BP: (!) 104/43 (!) 113/43 (!) 134/45 (!) 120/49  Pulse: 61 72 71 97  Resp: 20 (!) 22 19 (!) 22  Temp:      TempSrc:      SpO2: 100% 100% 100% 100%  Weight:      Height:        CBC:  Recent Labs  Lab 02/06/19 0915 02/07/19 0556  WBC 14.0* 23.6*  HGB 13.0 11.2*  HCT 39.4 33.9*  MCV 82.1 82.9  PLT 233 425    Basic Metabolic Panel:  Recent Labs  Lab 02/06/19 0105 02/06/19 0429 02/06/19 1427 02/07/19 0556  NA 134* 134*  --  135  K 4.1 4.2  --  4.7  CL 103  --   --  106  CO2 17*  --   --  17*  GLUCOSE 233*  --   --  243*  BUN 25*  --   --  47*  CREATININE 1.19  --   --  2.23*  CALCIUM 8.6*  --   --  7.9*  MG  --   --  2.1 2.0  PHOS  --   --  3.9 3.4   Lipid Panel:     Component Value Date/Time   CHOL 158 02/06/2019 0105   TRIG 53 02/06/2019 0105   HDL 42 02/06/2019 0105   CHOLHDL 3.8 02/06/2019 0105   VLDL 11 02/06/2019 0105   LDLCALC 105 (H) 02/06/2019 0105   HgbA1c:  Lab Results  Component Value Date   HGBA1C 7.1 (H) 02/06/2019   Urine Drug Screen: No results found for: LABOPIA, COCAINSCRNUR, LABBENZ, AMPHETMU, THCU, LABBARB  Alcohol Level     Component Value Date/Time   ETH <10 01/21/2019 1747    IMAGING Ct Head Wo Contrast  Result Date: 02/06/2019 CLINICAL DATA:  Stroke, follow-up, post tPA, follow-up 24 hours EXAM: CT HEAD WITHOUT CONTRAST TECHNIQUE: Contiguous axial images were obtained from the base of the skull through the vertex without intravenous contrast. COMPARISON:  Brain MRI 01/15/2019 FINDINGS: Brain: Again demonstrated are extensive ischemic changes within the bilateral cerebral and cerebellar hemispheres. As before, ischemic changes within the frontoparietal lobes  follows a somewhat linear watershed pattern. Cortical/subcortical ischemic changes at the sites are similar in distribution, although somewhat increased in extent as compared to prior brain MRI 01/19/2019. Ischemic changes within the bilateral occipital lobes and inferolateral left temporal lobe are similar, as are the ischemic changes within the bilateral cerebellar hemispheres. Also again demonstrated, there are ischemic changes within the bilateral thalami and right lateral medulla. Punctate hyperdensity along portions of the ischemic areas may reflect petechial hemorrhage (for instance as seen on series 3, image 25). Underlying generalized parenchymal atrophy and chronic small vessel ischemic disease. No midline shift. No extra-axial collection. Vascular: No hyperdense vessel. Atherosclerotic calcification of the carotid artery siphons and vertebrobasilar system. Skull: No calvarial fracture Sinuses/Orbits: Visualized orbits demonstrate no acute abnormality. Air-fluid levels within the sphenoid and right maxillary sinuses. Trace right mastoid effusion. Partially visualized support tubes. Impressions 1 and 2 below will be called to the ordering clinician or representative by the Radiologist Assistant, and communication documented in the PACS or zVision Dashboard. IMPRESSION: 1. Again demonstrated are extensive ischemic  changes involving the bilateral cerebral and cerebellar hemispheres with involvement of the bilateral thalami and right lateral medulla. The frontoparietal ischemic changes are unchanged in distribution although somewhat increased in extent as compared to brain MRI 01/16/2019. 2. A few punctate foci of hyperdensity along the ischemic regions may reflect petechial hemorrhage. 3. Background generalized parenchymal atrophy and chronic small vessel ischemic disease. 4. Paranasal sinus air-fluid levels.  Trace right mastoid effusion. Electronically Signed   By: Kellie Simmering   On: 02/06/2019 19:43   Ct  Head Wo Contrast  Result Date: 01/23/2019 CLINICAL DATA:  85 year old with facial trauma. Suspected fractures. EXAM: CT HEAD WITHOUT CONTRAST TECHNIQUE: Contiguous axial images were obtained from the base of the skull through the vertex without intravenous contrast. COMPARISON:  Head CT 04/21/2011 FINDINGS: Brain: No evidence for acute hemorrhage, mass lesion, midline shift, hydrocephalus or large infarct. Mild cerebral atrophy. Mild encephalomalacia in the right parietal-occipital region may be related to prior insult or infarct. Vascular: No hyperdense vessel or unexpected calcification. Skull: Negative for calvarial fracture. Sinuses/Orbits: Fluid and mucosal disease in the sphenoid sinuses. Paranasal sinus disease in the frontal sinuses and right maxillary sinus. There appears to be some layering fluid in the nasopharyngeal region. Evidence for endotracheal tube. Other: None IMPRESSION: 1. No acute intracranial abnormality. 2. Paranasal sinus disease particularly in the sphenoid sinus. 3. Question encephalomalacia in the right parietal-occipital region which could be related to old infarct. Electronically Signed   By: Markus Daft M.D.   On: 02/09/2019 18:41   Ct Cervical Spine Wo Contrast  Result Date: 01/26/2019 CLINICAL DATA:  85 year old found down on the front yard. Patient became unresponsive. EXAM: CT CERVICAL SPINE WITHOUT CONTRAST TECHNIQUE: Multidetector CT imaging of the cervical spine was performed without intravenous contrast. Multiplanar CT image reconstructions were also generated. COMPARISON:  None. FINDINGS: Alignment: Marked lordosis of the cervical spine. Skull base and vertebrae: No acute fracture. No primary bone lesion or focal pathologic process. Soft tissues and spinal canal: No prevertebral fluid or swelling. No visible canal hematoma. Absent or hypoplastic left thyroid tissue. Findings may be related to previous left hemithyroidectomy. No significant lymph node enlargement. Disc  levels: Posterior disc space narrowing at C5-C6 and C6-C7. Multilevel degenerative facet disease. Upper chest: Scarring at the right lung apex. Other: Endotracheal tube and orogastric tube are present. IMPRESSION: 1. No acute bone abnormality in cervical spine. 2. Prominent scarring at the right lung apex is only partially imaged. Electronically Signed   By: Markus Daft M.D.   On: 02/01/2019 18:57   Mr Angio Head Wo Contrast  Result Date: 01/28/2019 CLINICAL DATA:  Initial evaluation for acute stroke, found down. EXAM: MRI HEAD WITHOUT CONTRAST MRA HEAD WITHOUT CONTRAST TECHNIQUE: Multiplanar, multiecho pulse sequences of the brain and surrounding structures were obtained without intravenous contrast. Angiographic images of the head were obtained using MRA technique without contrast. COMPARISON:  Comparison made with prior CT from earlier the same day as well as previous MRI from 04/22/2011. FINDINGS: MRI HEAD FINDINGS Brain: Generalized age-related cerebral atrophy. Patchy and confluent T2/FLAIR hyperintensity within the periventricular deep white matter both cerebral hemispheres most consistent with chronic small vessel ischemic disease, mild for age. Few scattered remote lacunar infarcts noted within the hemispheric white matter. Small remote right PCA territory infarct involving the right parieto-occipital region noted. Associated chronic hemosiderin staining. Few tiny remote bilateral cerebellar infarcts. Extensive restricted diffusion involving predominantly the frontal and parietal lobes seen bilaterally, left slightly worse than right. Involvement is most pronounced  within the cortical gray matter, although there is some patchy subcortical involvement. Ischemic change involving the bilateral cerebral hemispheres extends in a somewhat linear watershed fashion. Mild patchy involvement of the occipital poles as well. Patchy ischemic change within the thalami bilaterally. Restricted diffusion also seen  involving the bilateral cerebellar hemispheres, left slightly worse than right. Patchy involvement of the lateral right medulla. No associated hemorrhage or significant mass effect. Overall, constellation of findings felt to be most consistent with a global hypoperfusion of than within [facility] a period No mass lesion, midline shift or mass effect. No hydrocephalus. No extra-axial fluid collection. Pituitary gland suprasellar region normal. Midline structures intact. Vascular: Major intracranial vascular flow voids are maintained. Probable DVA noted within the left cerebellum. Skull and upper cervical spine: Craniocervical junction within normal limits. Upper cervical spine normal. Bone marrow signal intensity within normal limits. Right frontal scalp contusion present. Sinuses/Orbits: Patient status post bilateral ocular lens replacement. Globes and orbital soft tissues demonstrate no acute finding. Extensive seen throughout the ethmoidal air cells and sphenoid sinuses with layering fluid within the right maxillary sinus. Fluid seen within the nasopharynx. Patient is intubated. Small right mastoid effusion. Other: None. MRA HEAD FINDINGS ANTERIOR CIRCULATION: Distal cervical segments of the internal carotid arteries are patent with antegrade flow. Petrous, cavernous, and supraclinoid ICAs patent without hemodynamically significant stenosis. A1 segments patent bilaterally. Normal anterior communicating artery. Anterior cerebral arteries patent to their distal aspects without flow-limiting stenosis. No M1 stenosis or occlusion. Negative MCA bifurcations. Distal MCA branches well perfused and symmetric. POSTERIOR CIRCULATION: Vertebral arteries patent to the vertebrobasilar junction without stenosis. Left vertebral artery dominant. Patent left PICA. Right PICA not seen. Basilar patent to its distal aspect without stenosis. Superior cerebral arteries patent bilaterally. Both of the posterior cerebral arteries primarily  supplied via the basilar and are well perfused to their distal aspects. No intracranial aneurysm. IMPRESSION: MRI HEAD IMPRESSION: 1. Extensive ischemic changes involving the bilateral cerebral and cerebellar hemispheres, as well as involvement of the bilateral thalami and right lateral medulla. Constellation of findings felt to be most consistent with hypoperfusion injury/cerebral anoxic event. No associated hemorrhage or significant mass effect. 2. Underlying age-related cerebral atrophy with mild chronic small vessel ischemic disease, with additional chronic ischemic changes as above. MRA HEAD IMPRESSION: Negative intracranial MRA with wide patency of the large and medium sized vessels of the intracranial circulation. No hemodynamically significant or correctable stenosis. Electronically Signed   By: Jeannine Boga M.D.   On: 01/19/2019 22:08   Mr Brain Wo Contrast  Result Date: 01/29/2019 CLINICAL DATA:  Initial evaluation for acute stroke, found down. EXAM: MRI HEAD WITHOUT CONTRAST MRA HEAD WITHOUT CONTRAST TECHNIQUE: Multiplanar, multiecho pulse sequences of the brain and surrounding structures were obtained without intravenous contrast. Angiographic images of the head were obtained using MRA technique without contrast. COMPARISON:  Comparison made with prior CT from earlier the same day as well as previous MRI from 04/22/2011. FINDINGS: MRI HEAD FINDINGS Brain: Generalized age-related cerebral atrophy. Patchy and confluent T2/FLAIR hyperintensity within the periventricular deep white matter both cerebral hemispheres most consistent with chronic small vessel ischemic disease, mild for age. Few scattered remote lacunar infarcts noted within the hemispheric white matter. Small remote right PCA territory infarct involving the right parieto-occipital region noted. Associated chronic hemosiderin staining. Few tiny remote bilateral cerebellar infarcts. Extensive restricted diffusion involving  predominantly the frontal and parietal lobes seen bilaterally, left slightly worse than right. Involvement is most pronounced within the cortical gray matter, although  there is some patchy subcortical involvement. Ischemic change involving the bilateral cerebral hemispheres extends in a somewhat linear watershed fashion. Mild patchy involvement of the occipital poles as well. Patchy ischemic change within the thalami bilaterally. Restricted diffusion also seen involving the bilateral cerebellar hemispheres, left slightly worse than right. Patchy involvement of the lateral right medulla. No associated hemorrhage or significant mass effect. Overall, constellation of findings felt to be most consistent with a global hypoperfusion of than within [facility] a period No mass lesion, midline shift or mass effect. No hydrocephalus. No extra-axial fluid collection. Pituitary gland suprasellar region normal. Midline structures intact. Vascular: Major intracranial vascular flow voids are maintained. Probable DVA noted within the left cerebellum. Skull and upper cervical spine: Craniocervical junction within normal limits. Upper cervical spine normal. Bone marrow signal intensity within normal limits. Right frontal scalp contusion present. Sinuses/Orbits: Patient status post bilateral ocular lens replacement. Globes and orbital soft tissues demonstrate no acute finding. Extensive seen throughout the ethmoidal air cells and sphenoid sinuses with layering fluid within the right maxillary sinus. Fluid seen within the nasopharynx. Patient is intubated. Small right mastoid effusion. Other: None. MRA HEAD FINDINGS ANTERIOR CIRCULATION: Distal cervical segments of the internal carotid arteries are patent with antegrade flow. Petrous, cavernous, and supraclinoid ICAs patent without hemodynamically significant stenosis. A1 segments patent bilaterally. Normal anterior communicating artery. Anterior cerebral arteries patent to their distal  aspects without flow-limiting stenosis. No M1 stenosis or occlusion. Negative MCA bifurcations. Distal MCA branches well perfused and symmetric. POSTERIOR CIRCULATION: Vertebral arteries patent to the vertebrobasilar junction without stenosis. Left vertebral artery dominant. Patent left PICA. Right PICA not seen. Basilar patent to its distal aspect without stenosis. Superior cerebral arteries patent bilaterally. Both of the posterior cerebral arteries primarily supplied via the basilar and are well perfused to their distal aspects. No intracranial aneurysm. IMPRESSION: MRI HEAD IMPRESSION: 1. Extensive ischemic changes involving the bilateral cerebral and cerebellar hemispheres, as well as involvement of the bilateral thalami and right lateral medulla. Constellation of findings felt to be most consistent with hypoperfusion injury/cerebral anoxic event. No associated hemorrhage or significant mass effect. 2. Underlying age-related cerebral atrophy with mild chronic small vessel ischemic disease, with additional chronic ischemic changes as above. MRA HEAD IMPRESSION: Negative intracranial MRA with wide patency of the large and medium sized vessels of the intracranial circulation. No hemodynamically significant or correctable stenosis. Electronically Signed   By: Jeannine Boga M.D.   On: 01/30/2019 22:08   Dg Pelvis Portable  Result Date: 02/03/2019 CLINICAL DATA:  Trauma. The patient was found down in his yard. Altered mental status. EXAM: PORTABLE PELVIS 1-2 VIEWS COMPARISON:  CT scan dated 12/31/2018 FINDINGS: There is no evidence of pelvic fracture or diastasis. No pelvic bone lesions are seen. Hardware in both hips. The previous fractures have completely healed. IMPRESSION: No acute abnormalities. Electronically Signed   By: Lorriane Shire M.D.   On: 02/04/2019 18:19   Dg Chest Port 1 View  Result Date: 02/10/2019 CLINICAL DATA:  Trauma. The patient was found down. Altered mental status. EXAM:  PORTABLE CHEST 1 VIEW COMPARISON:  12/31/2018 FINDINGS: Endotracheal tube is 6 cm above the carina. NG tube tip is in the body of the stomach. Heart size and pulmonary vascularity are normal. No infiltrates or effusions. Minimal chronic thickening of the right minor fissure. Minimal scarring at the right lung base. IMPRESSION: 1. Endotracheal tube is 6 cm above the carina. 2. No acute abnormalities in the chest. 3.  Aortic Atherosclerosis (ICD10-I70.0).  Electronically Signed   By: Lorriane Shire M.D.   On: 01/17/2019 18:21   Carotid (at [place] Only)  Result Date: 02/06/2019 Carotid Arterial Duplex Study Indications:       Syncope. Risk Factors:      Coronary artery disease. Other Factors:     Anoxic brain injury. Comparison Study:  No prior study on file for comparison Performing Technologist: Sharion Dove RVS  Examination Guidelines: A complete evaluation includes B-mode imaging, spectral Doppler, color Doppler, and power Doppler as needed of all accessible portions of each vessel. Bilateral testing is considered an integral part of a complete examination. Limited examinations for reoccurring indications may be performed as noted.  Right Carotid Findings: +----------+--------+--------+--------+------------------+------------------+           PSV cm/sEDV cm/sStenosisPlaque DescriptionComments           +----------+--------+--------+--------+------------------+------------------+ CCA Prox  62      12                                intimal thickening +----------+--------+--------+--------+------------------+------------------+ CCA Distal45      6                                 intimal thickening +----------+--------+--------+--------+------------------+------------------+ ICA Prox  87      14                                                   +----------+--------+--------+--------+------------------+------------------+ ICA Distal90      14                                                    +----------+--------+--------+--------+------------------+------------------+ ECA       109     4                                                    +----------+--------+--------+--------+------------------+------------------+ +----------+--------+-------+--------+-------------------+           PSV cm/sEDV cmsDescribeArm Pressure (mmHG) +----------+--------+-------+--------+-------------------+ [id number]                                         +----------+--------+-------+--------+-------------------+ +---------+--------+--+--------+--+ VertebralPSV cm/s86EDV cm/s12 +---------+--------+--+--------+--+  Left Carotid Findings: +----------+--------+--------+--------+------------------+------------------+           PSV cm/sEDV cm/sStenosisPlaque DescriptionComments           +----------+--------+--------+--------+------------------+------------------+ CCA Prox  62      8                                 intimal thickening +----------+--------+--------+--------+------------------+------------------+ CCA Distal50      10                                intimal  thickening +----------+--------+--------+--------+------------------+------------------+ ICA Prox  121     21              calcific                             +----------+--------+--------+--------+------------------+------------------+ ICA Distal93      17                                                   +----------+--------+--------+--------+------------------+------------------+ ECA       55      1                                                    +----------+--------+--------+--------+------------------+------------------+ +----------+--------+--------+--------+-------------------+           PSV cm/sEDV cm/sDescribeArm Pressure (mmHG) +----------+--------+--------+--------+-------------------+ [id number]                                           +----------+--------+--------+--------+-------------------+ +---------+--------+--+--------+--+ VertebralPSV cm/s53EDV cm/s10 +---------+--------+--+--------+--+  Summary: Right Carotid: Velocities in the right ICA are consistent with a 1-39% stenosis.                Abnormal spiked waveforms noted throughout. Left Carotid: Velocities in the left ICA are consistent with a 1-39% stenosis.               Abnormal spiked waveforms noted throughout.  *See table(s) above for measurements and observations.     Preliminary    Vas [place] Lower Extremity Venous (dvt)  Result Date: 02/07/2019  Lower Venous Study Indications: Stroke.  Comparison Study: no prior Performing Technologist: June Leap RDMS, RVT  Examination Guidelines: A complete evaluation includes B-mode imaging, spectral Doppler, color Doppler, and power Doppler as needed of all accessible portions of each vessel. Bilateral testing is considered an integral part of a complete examination. Limited examinations for reoccurring indications may be performed as noted.  +---------+---------------+---------+-----------+----------+--------------+ RIGHT    CompressibilityPhasicitySpontaneityPropertiesThrombus Aging +---------+---------------+---------+-----------+----------+--------------+ CFV      Full           Yes      Yes                                 +---------+---------------+---------+-----------+----------+--------------+ SFJ      Full                                                        +---------+---------------+---------+-----------+----------+--------------+ FV Prox  Full                                                        +---------+---------------+---------+-----------+----------+--------------+ FV Mid   Full                                                        +---------+---------------+---------+-----------+----------+--------------+  FV DistalPartial                                      Chronic         +---------+---------------+---------+-----------+----------+--------------+ PFV      Full                                                        +---------+---------------+---------+-----------+----------+--------------+ POP      Full           Yes      Yes                                 +---------+---------------+---------+-----------+----------+--------------+ PTV      Full                                                        +---------+---------------+---------+-----------+----------+--------------+ PERO                                                  Not visualized +---------+---------------+---------+-----------+----------+--------------+ limited evaluation of calf veins due to medially turned leg  +---------+---------------+---------+-----------+----------+--------------+ LEFT     CompressibilityPhasicitySpontaneityPropertiesThrombus Aging +---------+---------------+---------+-----------+----------+--------------+ CFV      Full           Yes      Yes                                 +---------+---------------+---------+-----------+----------+--------------+ SFJ      Full                                                        +---------+---------------+---------+-----------+----------+--------------+ FV Prox  Full                                                        +---------+---------------+---------+-----------+----------+--------------+ FV Mid   Full                                                        +---------+---------------+---------+-----------+----------+--------------+ FV DistalFull                                                        +---------+---------------+---------+-----------+----------+--------------+  PFV      Full                                                        +---------+---------------+---------+-----------+----------+--------------+ POP                     Yes      Yes                                  +---------+---------------+---------+-----------+----------+--------------+ PTV      Full                                                        +---------+---------------+---------+-----------+----------+--------------+ PERO     Full                                                        +---------+---------------+---------+-----------+----------+--------------+     Summary: Right: Findings consistent with chronic deep vein thrombosis involving the distal right femoral vein. No evidence of Acute DVT. No cystic structure found in the popliteal fossa. Left: There is no evidence of deep vein thrombosis in the lower extremity. No cystic structure found in the popliteal fossa.  *See table(s) above for measurements and observations.    Preliminary    Ct Maxillofacial Wo Contrast  Result Date: 01/31/2019 CLINICAL DATA:  Facial trauma. EXAM: CT MAXILLOFACIAL WITHOUT CONTRAST TECHNIQUE: Multidetector CT imaging of the maxillofacial structures was performed. Multiplanar CT image reconstructions were also generated. COMPARISON:  Head CT 02/11/2019 FINDINGS: Osseous: Patient is edentulous. Mandible is intact. Zygomatic arches are intact. Pterygoid plates appear to be intact. No evidence for acute facial bone fracture. Orbits: Negative. No traumatic or inflammatory finding. Sinuses: Air and fluid within the sphenoid sinuses. Mild mucosal disease in the frontal sinuses and maxillary sinuses. Soft tissues: No focal soft tissue abnormality. There is an endotracheal tube and evidence for orogastric tube. Limited intracranial: No acute abnormality. IMPRESSION: 1. No acute facial bone fracture. 2. Gas and fluid in the sphenoid sinus may be related to recent intubation. Mild paranasal sinus disease. Electronically Signed   By: Markus Daft M.D.   On: 02/08/2019 18:50    PHYSICAL EXAM   Temp:  [98.9 F (37.2 C)-100 F (37.8 C)] 99.7 F (37.6 C) (09/25 0400) Pulse Rate:  [29-97] 97 (09/25  0820) Resp:  [14-25] 22 (09/25 0820) BP: (90-142)/(33-104) 120/49 (09/25 0820) SpO2:  [90 %-100 %] 100 % (09/25 0820) FiO2 (%):  [40 %] 40 % (09/25 0820)  General - Well nourished, well developed, intubated off sedation.  Ophthalmologic - fundi not visualized due to noncooperation.  Cardiovascular - Regular rate and rhythm.  Neuro - intubated off sedation, eyes closed, not following commands. With forced eye opening, eyes in mid position, not blinking to visual threat, doll's eyes present, not tracking, PERRL. Corneal reflex present on the left but not on the right, gag and cough present. Breathing  over the vent.  Facial symmetry not able to test due to ET tube.  Tongue protrusion not cooperative. On pain stimulation, BUE mild extension posturing, R>L, and BLE 2/5 withdraw. DTR 1+ and no babinski. Sensation, coordination and gait not tested.   ASSESSMENT/PLAN [name] is a 85 y.o. male with history of HTN, HLD, CAD, prostate cancer s/p resection and non-small cell lung cancer found down a&o x 4 and became unresponsive with R gaze deviation. Intubated in ED. Received tPA 01/24/2019 at [place].    Stroke: bilaterally diffuse supra- and infratentorial infarcts - pattern consistent with cerebral hypoperfusion with hypoxic ischemic encephalopathy s/p IV tPA, concerning for cardiac source   CT head No acute abnormality. R parietal / occipital encephalomalacia.   MRI  Extensive ischemic changes B cerebral and cerebellar hemispheres, B thalami and R lateral ventricle c/w anoxic brain injury/hypoperfusion.   MRA  Unremarkable   Carotid Doppler unremarkable  2D Echo EF 60-65%  CT head extensive B cerebral and cerebellar, B thalamic and R lateral medullary ischemic changes more prominent than MRI. Few punctate areas of petechial hemorrhage. bacground parenchymal atrophy and small vessel disease. Sinus AF levels.  EEG no seizure, cortical dysfunction in right hemisphere as well as severe  diffuse encephalopathy, likely sec to diffuse anoxic/hypoxic brain injury.  LDL 105  HgbA1c 7.1  SCDs for VTE prophylaxis  aspirin 81 mg daily and clopidogrel 75 mg daily prior to admission, now on aspirin 325 mg daily.    Therapy recommendations:  pending   Disposition:  pending   Had long discussion with wife and daughter, they would like to give pt more days to see how he improves, if no improvement over the next several days, they will make decision for more comfort at that time.  NSTEMI vs. ? Cardiac arrest  EKG no STEMI  Troponin 789->717->680->619->543->466  MRI stroke pattern concerning for global hypoperfusion  EF 60-65%  No need cardiology consult as per CCM  Stress ulcer  Large amount of coffee ground emesis from OG  On PPI infusion  Hb 13.2->12.9->11.2  Close monitoring  Respiratory failure  Intubated for airway protection  On weaning trial  Wean off ventilation as able  CCM on board  Hypertension  Stable  Initially treated with cleviprex, now off BP goal < 180/105 x 24h following tPA administration  Hyperlipidemia  Home meds:  crestor 10  LDL 105, goal < 70  Increase crestor to 20  Continue statin at discharge  Diabetes type II Uncontrolled  HgbA1c 7.1, goal < 7.0  DB RN following  On lantus   CBGs  SSI  Dysphagia . Secondary to cerebral event  . NPO . 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 This  patient's care requires constant monitoring of vital signs, hemodynamics, respiratory and cardiac monitoring, review of multiple databases, neurological assessment, discussion with family, other specialists and medical decision making of high complexity. I spent 40 minutes of neurocritical care time in the care of this patient. I had long discussion with wife  and daughter at bedside, updated pt current condition, treatment plan and potential prognosis, and answered all the questions. They expressed understanding and appreciation.    Rosalin Hawking, MD PhD Stroke Neurology 02/07/2019 10:54 AM    To contact Stroke Continuity provider, please refer to [email or web address] After hours, contact General Neurology

## 2019-02-07 NOTE — Progress Notes (Signed)
Inpatient Diabetes Program Recommendations  AACE/ADA: New Consensus Statement on Inpatient Glycemic Control   Target Ranges:  Prepandial:   less than 140 mg/dL      Peak postprandial:   less than 180 mg/dL (1-2 hours)      Critically ill patients:  140 - 180 mg/dL   Results for Garrett Henson, Garrett Henson (MRN [id number]) as of 02/07/2019 14:35  Ref. Range 02/06/2019 07:44 02/06/2019 12:31 02/06/2019 16:13 02/06/2019 19:40 02/06/2019 23:24 02/07/2019 03:22 02/07/2019 07:39 02/07/2019 11:32  Glucose-Capillary Latest Ref Range: 70 - 99 mg/dL 242 (H) 240 (H) 203 (H) 181 (H) 180 (H) 197 (H) 255 (H) 244 (H)   Review of Glycemic Control  Current orders for Inpatient glycemic control: Lantus 20 units BID, Novoog 2-6 units Q4H; Vital @ 55 ml/hr  Inpatient Diabetes Program Recommendations:  Insulin-Basal: Noted Lantus 20 units BID ordered today. Would recommend using Novolog tube feeding coverage instead of increasing basal insulin. With increasing basal insulin there is an increased risk of hypoglycemia if tube feeding is stopped or held.   Insulin-Tube Feeding Coverage: If Lantus is decreased, please consider ordering Novolog 4 units Q4H for tube feeding coverage. If tube feeding is stopped or held then Novolog tube feeding coverage should also be stopped or held.  Thanks, Barnie Alderman, RN, MSN, CDE Diabetes Coordinator Inpatient Diabetes Program [phone] (Team Pager from 8am to 5pm)

## 2019-02-07 NOTE — Progress Notes (Signed)
NAME:  Garrett Henson, MRN:  [id number], DOB:  [birth year], LOS: 2 ADMISSION DATE:  01/21/2019, CONSULTATION DATE:  01/19/2019 REFERRING MD:  Dr. Cheral Marker, CHIEF COMPLAINT:  AMS  Brief History   63 yoM presenting from home after being found down in yard presenting as code stroke with altered mental status.  Rapid decline in mental status with EMS.  LSW 1645.  Required intubation on arrival to ER for airway protection with GCS 3 and right gaze.  CTH neg.  tPA given.  CTA deferred due to iodine allergy.  Taken for stat MRI.  Neurology consulting, PCCM asked to admit.   History of present illness   HPI obtained from medical chart review as patient is intubated and sedated.  Chart pending merger with MRN [id number].    85 year old male with prior history of HTN, DM, GERD, CAD on ASA/ plavix, HLD and wedge resection of non-small cell stage I a carcinoma the lung from the right lower lobe presenting in 2017.   Reportedly working outside in his yard today in his normal state of health when found down in yard by his wife after doing yard work.  Last known well 1615.  Found by EMS awake and able to answer questions with some facial abrasions.  Had rapid decline in mental status with EMS.  Upon arrival to ER, GCS declined to 3 with rightward fixed gaze and required intubation for airway protection. No seizure activity reported.  CTH negative.  CT maxiofacial, cervical, and pelvis XR negative for trauma workup.  Was hypertensive 184/83 treated with IV labetalol.  Labs noted for glucose 188, LA 2, Hgb 11.9, and normal coags. CXR with satisfactory ETT placement and without acute process.  EKG non-acute, SR but abnormal Rwave changes, minimal ST depression.  Due to prior severe iodine allergy, CT with contrast deferred.  Neurology concerned for possibility of basilar artery thrombosis with acute brainstem stroke, therefore patient given tPA.  Patient taken for emergent MRI, Neurology consulting and PCCM to admit.    Past Medical History   Past Medical History:  Diagnosis Date  . Arthritis   . Back pain   . Coronary artery disease    takes Plavix daily  . Diabetes mellitus    takes Metformin daily  . GERD (gastroesophageal reflux disease)    takes tagamet daily  . Headache    HX MIGRAINES  . History of blood transfusion    no abnormal reaction noted  . History of colon polyps    benign  . History of migraine    many yrs ago  . Hyperlipidemia    takes Crestor daily  . Hypertension    takes Amlodipine daily  . Joint swelling   . Lung mass 12/01/2014  . Lung mass 12/01/2014  . Macular degeneration    wet and on the left;injection 01/20/15  . Nocturia   . Overactive bladder    takes Myrbetriq daily  . Prostate cancer ([place])   . Stroke ([place])   . TIA (transient ischemic attack)   . Urinary frequency   . Urinary urgency   . Vertigo    takes Antivert daily as needed        Patient Active Problem List   Diagnosis Date Noted  . Preop cardiovascular exam 03/02/2015  . Cerebrovascular disease 03/02/2015  . Hyperlipidemia 03/02/2015  . Lung cancer ([place]) 01/26/2015  . DM2 (diabetes mellitus, type 2) ([place]) 04/23/2011  . CAD (coronary artery disease) 04/23/2011  . Stroke ([place]) 04/22/2011  .  Chest pain 04/22/2011  . Hypertension     Significant Hospital Events   9/23 Admit  Consults:  Neurology  Procedures:  9/23 ETT >>  Significant Diagnostic Tests:  9/23 CTH > 1. No acute intracranial abnormality. 2. Paranasal sinus disease particularly in the sphenoid sinus. 3. Question encephalomalacia in the right parietal-occipital region which could be related to old infarct. CT maxillofacial 9/23 > 1. No acute facial bone fracture. 2. Gas and fluid in the sphenoid sinus may be related to recent intubation. Mild paranasal sinus disease. CT cervical 9/23 > 1. No acute bone abnormality in cervical spine. 2. Prominent scarring at the right lung apex is only  partially imaged. 9/23 Pelvis XR > neg 9/23 MRI brain / angio > Negative intracranial MRA with wide patency of the large and medium sized vessels of the intracranial circulation. No hemodynamically significant or correctable stenosis.  Micro Data:  9/23 SARS Cov2 > neg  Antimicrobials:  None.  Interim history/subjective:  Remains off sedation. Intermittent response to pain. Family confusion regarding prognosis.  Objective   Blood pressure (!) 120/49, pulse 97, temperature 99.7 F (37.6 C), temperature source Axillary, resp. rate (!) 22, height 5\' 10"  (1.778 m), weight 73.9 kg, SpO2 100 %.    Vent Mode: PSV;CPAP FiO2 (%):  [40 %] 40 % Set Rate:  [16 bmp] 16 bmp Vt Set:  [480 mL] 480 mL PEEP:  [5 cmH20] 5 cmH20 Pressure Support:  [10 cmH20] 10 cmH20 Plateau Pressure:  [11 cmH20-17 cmH20] 17 cmH20   Intake/Output Summary (Last 24 hours) at 02/07/2019 1041 Last data filed at 02/07/2019 0400 Gross per 24 hour  Intake 2815.28 ml  Output 205 ml  Net 2610.28 ml   Filed Weights   01/14/2019 1900 01/18/2019 1935 02/06/19 0035  Weight: 74.4 kg 74.4 kg 73.9 kg   Examination: General:  Elderly male on vent  HEENT: ETT/OG in place, minimal secretions. Neuro:  +cough/+gag, pupils intact, withdrawals right upper/lower, left lower, flicker noted to left upper, occasionally opens eyes to stimulation.  CV: Atrial fibrillation, JVP at 3cm ASA without HJR. PULM:  No ventilator asynchrony, chest clear to auscultation.  GI: foley present  Extremities: -edema  Skin: no rashes, skin bruising to bilateral arms- wrapped   I performed a CCM echocardiogram: normal LVSF, RV normal function. IVC normal caliber with normal respiratory variation.   Resolved Hospital Problem list    Assessment & Plan:   Critically ill due to acute respiratory insufficiency requiring mechanical ventilation.  - Mental status precludes extubation at this time. - Continue daily SBT.   Acute encephalopathy MRI with  extensive ischemic changes in bilateral cerebral and cerebellar hemispheres with involvement of bilateral thalami and right lateral medulla concerning for hypoxia vs hypoperfusion ischemia  Extensive ischemia on CT  Consistent with HIE/watershed infarction. EEG shows continuous diffuse slowing. May still have some recovery. - Continue secondary stroke prevention. - Keep off sedation.  Elevation in troponin consistent with type 2 myocardial injury.  -Medical management only given age and co-morbidities especially considering mental status.  Acute kidney injury Echocardiogram doesn't show volume overload.  May still be volume responsive.  Suspect patient has established ATN - Fluid challenge. - Follow urine output and creatinine   Best practice:  Diet: NPO - tube feeds. Pain/Anxiety/Delirium protocol (if indicated): none VAP protocol (if indicated): yes DVT prophylaxis: s/p tPA GI prophylaxis: PPI Glucose control: SSI + Lantus for uncontrolled T2DM Mobility: BR Code Status: Limited- NO CPR, ok with peripheral pressors.  Wife  states patient has known prior documents / wishes.  Will continue with full support/ measures but in the event of cardiac arrest, No CPR Family Communication: Wife, Vickii Chafe [phone]) and daughter Jacqlyn Larsen [phone]). I attempted to call daughter but there was no answer.  Disposition: Neuro ICU   Labs   CBC: Recent Labs  Lab 01/16/2019 1747  01/17/2019 1852 02/06/19 0105 02/06/19 0429 02/06/19 0915 02/07/19 0556  WBC 8.3  --   --  16.7*  --  14.0* 23.6*  HGB 11.9*   < > 11.2* 13.2 12.9* 13.0 11.2*  HCT 37.5*   < > 33.0* 40.9 38.0* 39.4 33.9*  MCV 84.5  --   --  84.7  --  82.1 82.9  PLT 204  --   --  212  --  233 203   < > = values in this interval not displayed.    Basic Metabolic Panel: Recent Labs  Lab 02/04/2019 1747 01/27/2019 1813 01/29/2019 1852 01/30/2019 2213 02/06/19 0105 02/06/19 0429 02/06/19 1427 02/07/19 0556  NA 136 136 136  --  134*  134*  --  135  K 4.0 3.9 3.9  --  4.1 4.2  --  4.7  CL 103 104  --   --  103  --   --  106  CO2 20*  --   --   --  17*  --   --  17*  GLUCOSE 188* 191*  --   --  233*  --   --  243*  BUN 22 24*  --   --  25*  --   --  47*  CREATININE 1.15 1.00  --   --  1.19  --   --  2.23*  CALCIUM 8.8*  --   --   --  8.6*  --   --  7.9*  MG  --   --   --  2.0  --   --  2.1 2.0  PHOS  --   --   --  2.9  --   --  3.9 3.4   GFR: Estimated Creatinine Clearance: 22.3 mL/min (A) (by C-G formula based on SCr of 2.23 mg/dL (H)). Recent Labs  Lab 01/18/2019 1747 02/06/2019 2213 02/06/19 0105 02/06/19 0915 02/07/19 0556  WBC 8.3  --  16.7* 14.0* 23.6*  LATICACIDVEN 2.0* 2.1*  --   --   --     Liver Function Tests: Recent Labs  Lab 01/22/2019 1747  AST 24  ALT 11  ALKPHOS 73  BILITOT 0.3  PROT 6.7  ALBUMIN 3.6   No results for input(s): LIPASE, AMYLASE in the last 168 hours. No results for input(s): AMMONIA in the last 168 hours.  ABG    Component Value Date/Time   PHART 7.316 (L) 02/06/2019 0429   PCO2ART 33.5 02/06/2019 0429   PO2ART 156.0 (H) 02/06/2019 0429   HCO3 17.2 (L) 02/06/2019 0429   TCO2 18 (L) 02/06/2019 0429   ACIDBASEDEF 8.0 (H) 02/06/2019 0429   O2SAT 99.0 02/06/2019 0429     Coagulation Profile: Recent Labs  Lab 01/15/2019 1747  INR 1.0    Cardiac Enzymes: No results for input(s): CKTOTAL, CKMB, CKMBINDEX, TROPONINI in the last 168 hours.  HbA1C: Hgb A1c MFr Bld  Date/Time Value Ref Range Status  02/06/2019 01:05 AM 7.1 (H) 4.8 - 5.6 % Final    Comment:    (NOTE) Pre diabetes:          5.7%-6.4% Diabetes:              >  6.4% Glycemic control for   <7.0% adults with diabetes     CBG: Recent Labs  Lab 02/06/19 1613 02/06/19 1940 02/06/19 2324 02/07/19 0322 02/07/19 0739  GLUCAP 203* 181* 180* 197* 255*     CRITICAL CARE Performed by: Kipp Brood   Total critical care time: 45 minutes  Critical care time was exclusive of separately billable  procedures and treating other patients.  Critical care was necessary to treat or prevent imminent or life-threatening deterioration.  Critical care was time spent personally by me on the following activities: development of treatment plan with patient and/or surrogate as well as nursing, discussions with consultants, evaluation of patient's response to treatment, examination of patient, obtaining history from patient or surrogate, ordering and performing treatments and interventions, ordering and review of laboratory studies, ordering and review of radiographic studies, pulse oximetry, re-evaluation of patient's condition and participation in multidisciplinary rounds.  Kipp Brood, MD [facility] ICU Physician [place]  Pager: [phone] Mobile: [phone] After hours: [phone].

## 2019-02-07 NOTE — Progress Notes (Signed)
LE venous duplex       has been completed. Preliminary results can be found under CV proc through chart review. Chance Karam, BS, RDMS, RVT   

## 2019-02-07 NOTE — Progress Notes (Signed)
OT Cancellation Note  Patient Details Name: Garrett Henson MRN: [id number] DOB: 07-Dec-1924   Cancelled Treatment:    Reason Eval/Treat Not Completed: Patient not medically ready;Medical issues which prohibited therapy; remains intubated. Spoke with RN as well, prognosis poor at this time. OT to sign off. Please re-consult if pt's needs change.  Lou Cal, OT [facility] Pager [phone] Office [phone]    Raymondo Band 02/07/2019, 1:07 PM

## 2019-02-07 NOTE — Progress Notes (Signed)
PT Cancellation Note  Patient Details Name: Garrett Henson MRN: [id number] DOB: 1924-08-12   Cancelled Treatment:    Reason Eval/Treat Not Completed: Medical issues which prohibited therapy .   PT to sign off.  Prognosis is poor.    Thanks,  Barbarann Ehlers. Jashiya Bassett, PT, DPT  Acute Rehabilitation [phone] pager [phone] office  @ [facility]: [phone]    Harvie Heck 02/07/2019, 3:04 PM

## 2019-02-08 LAB — GLUCOSE, CAPILLARY
Glucose-Capillary: 188 mg/dL — ABNORMAL HIGH (ref 70–99)
Glucose-Capillary: 192 mg/dL — ABNORMAL HIGH (ref 70–99)
Glucose-Capillary: 226 mg/dL — ABNORMAL HIGH (ref 70–99)
Glucose-Capillary: 131 mg/dL — ABNORMAL HIGH (ref 70–99)
Glucose-Capillary: 117 mg/dL — ABNORMAL HIGH (ref 70–99)
Glucose-Capillary: 164 mg/dL — ABNORMAL HIGH (ref 70–99)

## 2019-02-08 MED ORDER — ACETAMINOPHEN 160 MG/5ML PO SOLN
650.0000 mg | Freq: Four times a day (QID) | ORAL | Status: DC | PRN
  Administered 2019-02-08 – 2019-02-13 (×5): 650 mg
  Filled 2019-02-08 (×4): qty 20.3

## 2019-02-08 MED ORDER — ENOXAPARIN SODIUM 30 MG/0.3ML ~~LOC~~ SOLN
30.0000 mg | SUBCUTANEOUS | Status: DC
  Administered 2019-02-08 – 2019-02-13 (×6): 30 mg via SUBCUTANEOUS
  Filled 2019-02-08 (×6): qty 0.3

## 2019-02-08 NOTE — Progress Notes (Signed)
STROKE TEAM PROGRESS NOTE   INTERVAL HISTORY Family not at bedside. Pt still intubated, not open eyes on voice or pain. Posturing in the uppers, triple flex in lowers.  Vitals:   02/08/19 1500 02/08/19 1557 02/08/19 1600 02/08/19 1700  BP: (!) 145/37  138/71 131/65  Pulse: 61  79 75  Resp: (!) 23  (!) 25 (!) 24  Temp:   98.3 F (36.8 C)   TempSrc:   Axillary   SpO2: 100% 99% 99% 98%  Weight:      Height:        CBC:  Recent Labs  Lab 02/06/19 0915 02/07/19 0556  WBC 14.0* 23.6*  HGB 13.0 11.2*  HCT 39.4 33.9*  MCV 82.1 82.9  PLT 233 026    Basic Metabolic Panel:  Recent Labs  Lab 02/07/19 0556 02/07/19 1629  NA 135 137  K 4.7 4.5  CL 106 113*  CO2 17* 14*  GLUCOSE 243* 257*  BUN 47* 60*  CREATININE 2.23* 2.22*  CALCIUM 7.9* 7.7*  MG 2.0 2.1  PHOS 3.4 2.4*   Lipid Panel:     Component Value Date/Time   CHOL 158 02/06/2019 0105   TRIG 53 02/06/2019 0105   HDL 42 02/06/2019 0105   CHOLHDL 3.8 02/06/2019 0105   VLDL 11 02/06/2019 0105   LDLCALC 105 (H) 02/06/2019 0105   HgbA1c:  Lab Results  Component Value Date   HGBA1C 7.1 (H) 02/06/2019   Urine Drug Screen: No results found for: LABOPIA, COCAINSCRNUR, LABBENZ, AMPHETMU, THCU, LABBARB  Alcohol Level     Component Value Date/Time   ETH <10 01/18/2019 1747    IMAGING  Ct Head Wo Contrast 02/06/2019 IMPRESSION:  1. Again demonstrated are extensive ischemic changes involving the bilateral cerebral and cerebellar hemispheres with involvement of the bilateral thalami and right lateral medulla. The frontoparietal ischemic changes are unchanged in distribution although somewhat increased in extent as compared to brain MRI 01/28/2019.  2. A few punctate foci of hyperdensity along the ischemic regions may reflect petechial hemorrhage.  3. Background generalized parenchymal atrophy and chronic small vessel ischemic disease. 4. Paranasal sinus air-fluid levels.  Trace right mastoid effusion.   Vas [place]  Lower Extremity Venous (dvt) 02/08/2019 Summary:  Right: Findings consistent with chronic deep vein thrombosis involving the distal right femoral vein. No evidence of Acute DVT. No cystic structure found in the popliteal fossa.  Left: There is no evidence of deep vein thrombosis in the lower extremity. No cystic structure found in the popliteal fossa.     PHYSICAL EXAM   Temp:  [98.3 F (36.8 C)-101.4 F (38.6 C)] 98.3 F (36.8 C) (09/26 1600) Pulse Rate:  [54-117] 75 (09/26 1700) Resp:  [9-28] 24 (09/26 1700) BP: (111-151)/(35-71) 131/65 (09/26 1700) SpO2:  [93 %-100 %] 98 % (09/26 1700) FiO2 (%):  [40 %] 40 % (09/26 1600)  General - Well nourished, well developed, intubated off sedation.  Ophthalmologic - fundi not visualized due to noncooperation.  Cardiovascular - Regular rate and rhythm.  Neuro - intubated off sedation, eyes closed, not following commands. With forced eye opening, eyes in mid position, not blinking to visual threat, doll's eyes present, not tracking, PERRL. Corneal reflex present on the left but not on the right, gag and cough present. Breathing over the vent.  Facial symmetry not able to test due to ET tube.  Tongue protrusion not cooperative. On pain stimulation, BUE extension posturing, R>L, and BLE 2/5 triple flex. DTR 1+ and toes  equiv. Sensation, coordination and gait not tested.   ASSESSMENT/PLAN Mr. Matheson Vandehei is a 85 y.o. male with history of HTN, HLD, CAD, prostate cancer s/p resection and non-small cell lung cancer found down a&o x 4 and became unresponsive with R gaze deviation. Intubated in ED. Received tPA 01/16/2019 at [facility].    Stroke: bilaterally diffuse supra- and infratentorial infarcts - pattern consistent with cerebral hypoperfusion with hypoxic ischemic encephalopathy s/p IV tPA, concerning for cardiac source   CT head No acute abnormality. R parietal / occipital encephalomalacia.   MRI  Extensive ischemic changes B cerebral and  cerebellar hemispheres, B thalami and R lateral ventricle c/w anoxic brain injury/hypoperfusion.   MRA  Unremarkable   Carotid Doppler unremarkable  2D Echo EF 60-65%  CT head extensive B cerebral and cerebellar, B thalamic and R lateral medullary ischemic changes more prominent than MRI. Few punctate areas of petechial hemorrhage. background parenchymal atrophy and small vessel disease. Sinus AF levels.  EEG no seizure, cortical dysfunction in right hemisphere as well as severe diffuse encephalopathy, likely sec to diffuse anoxic/hypoxic brain injury.  LDL 105  HgbA1c 7.1  SCDs for VTE prophylaxis  aspirin 81 mg daily and clopidogrel 75 mg daily prior to admission, now on aspirin 325 mg daily.    Therapy recommendations:  pending   Disposition:  pending   Dr. Erlinda Hong had a long discussion with wife and daughter, they would like to give pt more days to see how he improves, if no improvement over the next several days, they will make decision for more comfort at that time. Wife was at bedside, was very happy he was moving but could not really understand that posturing is a reflex and not a sign of improvement. Will give it a few more days, posturing worse today and triple flex may get worse and we can discuss again with family.  NSTEMI vs. ? Cardiac arrest  EKG no STEMI  Troponin 789->717->680->619->543->466  MRI stroke pattern concerning for global hypoperfusion  EF 60-65%  No need cardiology consult as per CCM  Stress ulcer  Large amount of coffee ground emesis from OG  On PPI infusion  Hb 13.2->12.9->11.2  Close monitoring of GI bleed  Respiratory failure  Intubated for airway protection  On weaning trial  Wean off ventilation as able  CCM on board  Hypertension  Stable  Initially treated with cleviprex, now off BP goal < 180/105 x 24h following tPA administration  Hyperlipidemia  Home meds:  crestor 10  LDL 105, goal < 70  Increase crestor to  20  Continue statin at discharge  Diabetes type II Uncontrolled  HgbA1c 7.1, goal < 7.0  DB RN following  On lantus   CBGs  SSI  Dysphagia . Secondary to cerebral event  . 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 On low dose Lovenox and SCDs. CBC in AM. (Dr Maryjean Ka does not feel anticoagulation would be prudent at this time; however, an IVC filter would be an option if  the family wanted to pursue this course.)   Hospital day # 3  This patient is critically ill due to diffuse bilateral strokes, elevated troponin, GI bleeding, coma and at significant risk of neurological worsening, death form recurrent stroke, hemorrhagic conversion, cerebral edema, brain herniation, seizure. This patient's care requires constant monitoring of vital signs, hemodynamics, respiratory and cardiac monitoring, review of multiple databases, neurological assessment, discussion with family, other specialists and medical decision making of high complexity. I spent 40 minutes of neurocritical care time in the care of this patient.      To contact Stroke Continuity provider, please refer to [email or web address] After hours, contact General Neurology

## 2019-02-08 NOTE — Progress Notes (Signed)
NAME:  Garrett Henson, MRN:  [id number], DOB:  [birth year], LOS: 3 ADMISSION DATE:  01/17/2019, CONSULTATION DATE:  01/25/2019 REFERRING MD:  Dr. Cheral Marker, CHIEF COMPLAINT:  AMS  Brief History   31 yoM presenting from home after being found down in yard presenting as code stroke with altered mental status.  Rapid decline in mental status with EMS.  LSW 1645.  Required intubation on arrival to ER for airway protection with GCS 3 and right gaze.  CTH neg.  tPA given.  CTA deferred due to iodine allergy.  Taken for stat MRI.  Neurology consulting, PCCM asked to admit.   History of present illness   HPI obtained from medical chart review as patient is intubated and sedated.  Chart pending merger with MRN [id number].    85 year old male with prior history of HTN, DM, GERD, CAD on ASA/ plavix, HLD and wedge resection of non-small cell stage I a carcinoma the lung from the right lower lobe presenting in 2017.   Reportedly working outside in his yard today in his normal state of health when found down in yard by his wife after doing yard work.  Last known well 1615.  Found by EMS awake and able to answer questions with some facial abrasions.  Had rapid decline in mental status with EMS.  Upon arrival to ER, GCS declined to 3 with rightward fixed gaze and required intubation for airway protection. No seizure activity reported.  CTH negative.  CT maxiofacial, cervical, and pelvis XR negative for trauma workup.  Was hypertensive 184/83 treated with IV labetalol.  Labs noted for glucose 188, LA 2, Hgb 11.9, and normal coags. CXR with satisfactory ETT placement and without acute process.  EKG non-acute, SR but abnormal Rwave changes, minimal ST depression.  Due to prior severe iodine allergy, CT with contrast deferred.  Neurology concerned for possibility of basilar artery thrombosis with acute brainstem stroke, therefore patient given tPA.  Patient taken for emergent MRI, Neurology consulting and PCCM to admit.    Past Medical History   Past Medical History:  Diagnosis Date  . Arthritis   . Back pain   . Coronary artery disease    takes Plavix daily  . Diabetes mellitus    takes Metformin daily  . GERD (gastroesophageal reflux disease)    takes tagamet daily  . Headache    HX MIGRAINES  . History of blood transfusion    no abnormal reaction noted  . History of colon polyps    benign  . History of migraine    many yrs ago  . Hyperlipidemia    takes Crestor daily  . Hypertension    takes Amlodipine daily  . Joint swelling   . Lung mass 12/01/2014  . Lung mass 12/01/2014  . Macular degeneration    wet and on the left;injection 01/20/15  . Nocturia   . Overactive bladder    takes Myrbetriq daily  . Prostate cancer ([place])   . Stroke ([place])   . TIA (transient ischemic attack)   . Urinary frequency   . Urinary urgency   . Vertigo    takes Antivert daily as needed        Patient Active Problem List   Diagnosis Date Noted  . Preop cardiovascular exam 03/02/2015  . Cerebrovascular disease 03/02/2015  . Hyperlipidemia 03/02/2015  . Lung cancer ([place]) 01/26/2015  . DM2 (diabetes mellitus, type 2) ([place]) 04/23/2011  . CAD (coronary artery disease) 04/23/2011  . Stroke ([place]) 04/22/2011  .  Chest pain 04/22/2011  . Hypertension     Significant Hospital Events   9/23 Admit  Consults:  Neurology  Procedures:  9/23 ETT >>  Significant Diagnostic Tests:  9/23 CTH > 1. No acute intracranial abnormality. 2. Paranasal sinus disease particularly in the sphenoid sinus. 3. Question encephalomalacia in the right parietal-occipital region which could be related to old infarct. CT maxillofacial 9/23 > 1. No acute facial bone fracture. 2. Gas and fluid in the sphenoid sinus may be related to recent intubation. Mild paranasal sinus disease. CT cervical 9/23 > 1. No acute bone abnormality in cervical spine. 2. Prominent scarring at the right lung apex is only  partially imaged. 9/23 Pelvis XR > neg 9/23 MRI brain / angio > Negative intracranial MRA with wide patency of the large and medium sized vessels of the intracranial circulation. No hemodynamically significant or correctable stenosis.  Micro Data:  9/23 SARS Cov2 > neg  Antimicrobials:  None.  Interim history/subjective:  No changes or acute events in last 24 hours. Remains off sedation. Intermittent response to pain. Family confusion regarding prognosis.  Objective   Blood pressure (!) 128/41, pulse 96, temperature (!) 101.4 F (38.6 C), temperature source Axillary, resp. rate (!) 25, height 5\' 10"  (1.778 m), weight 73.9 kg, SpO2 99 %.    Vent Mode: PSV;CPAP FiO2 (%):  [40 %] 40 % Set Rate:  [16 bmp] 16 bmp Vt Set:  [480 mL] 480 mL PEEP:  [5 cmH20] 5 cmH20 Pressure Support:  [10 cmH20] 10 cmH20 Plateau Pressure:  [13 cmH20] 13 cmH20   Intake/Output Summary (Last 24 hours) at 02/08/2019 1003 Last data filed at 02/08/2019 0800 Gross per 24 hour  Intake 2280.37 ml  Output 840 ml  Net 1440.37 ml   Filed Weights   01/23/2019 1900 01/16/2019 1935 02/06/19 0035  Weight: 74.4 kg 74.4 kg 73.9 kg   Examination: General:  Elderly male on vent  HEENT: ETT/OG in place, minimal secretions. Neuro:  +cough/+gag, pupils 3 mm on right 69mm left; no corneal reflexes , withdraws slightly right lower, w/d significanly on left lower, does not w/d either upper--mild arm straightening without overt posturing. occasionally opens eyes to stimulation. No ankle clonus; left + babinski CV: Atrial fibrillation, no r/m/g PULM:  No ventilator asynchrony, chest clear to auscultation.  GI: foley present  Extremities: -edema  Skin: no rashes, skin bruising to bilateral arms- wrapped     Resolved Hospital Problem list    Assessment & Plan:   Critically ill due to acute respiratory insufficiency requiring mechanical ventilation.  - Mental status precludes extubation at this time. - Continue daily SBT.    Acute encephalopathy MRI with extensive ischemic changes in bilateral cerebral and cerebellar hemispheres with involvement of bilateral thalami and right lateral medulla concerning for hypoxia vs hypoperfusion ischemia  Extensive ischemia on CT  Consistent with HIE/watershed infarction. EEG shows continuous diffuse slowing. May still have some recovery. - Continue secondary stroke prevention. - Keep off sedation.  Elevation in troponin consistent with type 2 myocardial injury.  -Medical management only given age and co-morbidities especially considering mental status.  Acute kidney injury Echocardiogram doesn't show volume overload.  May still be volume responsive.  Suspect patient has established ATN - Fluid challenge. - Follow urine output and creatinine  F/E/N: Monitor and replete as necessary. Continue TF   Best practice:  Diet: NPO - tube feeds. Pain/Anxiety/Delirium protocol (if indicated): none VAP protocol (if indicated): yes DVT prophylaxis: s/p tPA GI prophylaxis: PPI Glucose  control: SSI + Lantus for uncontrolled T2DM Mobility: BR Code Status: Limited- NO CPR, ok with peripheral pressors.  Wife states patient has known prior documents / wishes.  Will continue with full support/ measures but in the event of cardiac arrest, No CPR Family Communication: Wife, Vickii Chafe [phone]) and daughter Jacqlyn Larsen [phone]). Prognosis poor. Disposition: Neuro ICU   Labs   CBC: Recent Labs  Lab 02/07/2019 1747  02/02/2019 1852 02/06/19 0105 02/06/19 0429 02/06/19 0915 02/07/19 0556  WBC 8.3  --   --  16.7*  --  14.0* 23.6*  HGB 11.9*   < > 11.2* 13.2 12.9* 13.0 11.2*  HCT 37.5*   < > 33.0* 40.9 38.0* 39.4 33.9*  MCV 84.5  --   --  84.7  --  82.1 82.9  PLT 204  --   --  212  --  233 203   < > = values in this interval not displayed.    Basic Metabolic Panel: Recent Labs  Lab 01/14/2019 1747 01/30/2019 1813 02/11/2019 1852 01/28/2019 2213 02/06/19 0105 02/06/19 0429  02/06/19 1427 02/07/19 0556 02/07/19 1629  NA 136 136 136  --  134* 134*  --  135 137  K 4.0 3.9 3.9  --  4.1 4.2  --  4.7 4.5  CL 103 104  --   --  103  --   --  106 113*  CO2 20*  --   --   --  17*  --   --  17* 14*  GLUCOSE 188* 191*  --   --  233*  --   --  243* 257*  BUN 22 24*  --   --  25*  --   --  47* 60*  CREATININE 1.15 1.00  --   --  1.19  --   --  2.23* 2.22*  CALCIUM 8.8*  --   --   --  8.6*  --   --  7.9* 7.7*  MG  --   --   --  2.0  --   --  2.1 2.0 2.1  PHOS  --   --   --  2.9  --   --  3.9 3.4 2.4*   GFR: Estimated Creatinine Clearance: 22.4 mL/min (A) (by C-G formula based on SCr of 2.22 mg/dL (H)). Recent Labs  Lab 01/31/2019 1747 02/09/2019 2213 02/06/19 0105 02/06/19 0915 02/07/19 0556  WBC 8.3  --  16.7* 14.0* 23.6*  LATICACIDVEN 2.0* 2.1*  --   --   --     Liver Function Tests: Recent Labs  Lab 02/12/2019 1747  AST 24  ALT 11  ALKPHOS 73  BILITOT 0.3  PROT 6.7  ALBUMIN 3.6   No results for input(s): LIPASE, AMYLASE in the last 168 hours. No results for input(s): AMMONIA in the last 168 hours.  ABG    Component Value Date/Time   PHART 7.316 (L) 02/06/2019 0429   PCO2ART 33.5 02/06/2019 0429   PO2ART 156.0 (H) 02/06/2019 0429   HCO3 17.2 (L) 02/06/2019 0429   TCO2 18 (L) 02/06/2019 0429   ACIDBASEDEF 8.0 (H) 02/06/2019 0429   O2SAT 99.0 02/06/2019 0429     Coagulation Profile: Recent Labs  Lab 02/04/2019 1747  INR 1.0    Cardiac Enzymes: Recent Labs  Lab 02/07/19 1037  CKTOTAL 287    HbA1C: Hgb A1c MFr Bld  Date/Time Value Ref Range Status  02/06/2019 01:05 AM 7.1 (H) 4.8 - 5.6 % Final  Comment:    (NOTE) Pre diabetes:          5.7%-6.4% Diabetes:              >6.4% Glycemic control for   <7.0% adults with diabetes     CBG: Recent Labs  Lab 02/07/19 1644 02/07/19 1923 02/07/19 2323 02/08/19 0317 02/08/19 0726  GLUCAP 219* 216* 237* 188* 192*     CRITICAL CARE Performed by: Bonna Gains   I have  independently seen and examined the patient, reviewed data, and developed an assessment and plan. A total of 35 minutes were spent in critical care assessment and medical decision making. This critical care time does not reflect procedure time, or teaching time or supervisory time of PA/NP/Med student/Med Resident, etc but could involve care discussion time.  Bonna Gains, MD PhD 02/08/19 10:10 AM

## 2019-02-09 ENCOUNTER — Inpatient Hospital Stay (HOSPITAL_COMMUNITY): Payer: Medicare Other

## 2019-02-09 LAB — CBC
HCT: 26.5 % — ABNORMAL LOW (ref 39.0–52.0)
HCT: 24 % — ABNORMAL LOW (ref 39.0–52.0)
Hemoglobin: 8.6 g/dL — ABNORMAL LOW (ref 13.0–17.0)
Hemoglobin: 7.6 g/dL — ABNORMAL LOW (ref 13.0–17.0)
MCH: 27.1 pg (ref 26.0–34.0)
MCH: 26.8 pg (ref 26.0–34.0)
MCHC: 32.5 g/dL (ref 30.0–36.0)
MCHC: 31.7 g/dL (ref 30.0–36.0)
MCV: 83.6 fL (ref 80.0–100.0)
MCV: 84.5 fL (ref 80.0–100.0)
Platelets: 159 10*3/uL (ref 150–400)
Platelets: 160 10*3/uL (ref 150–400)
RBC: 3.17 MIL/uL — ABNORMAL LOW (ref 4.22–5.81)
RBC: 2.84 MIL/uL — ABNORMAL LOW (ref 4.22–5.81)
RDW: 15.6 % — ABNORMAL HIGH (ref 11.5–15.5)
RDW: 15.7 % — ABNORMAL HIGH (ref 11.5–15.5)
WBC: 10.7 10*3/uL — ABNORMAL HIGH (ref 4.0–10.5)
WBC: 9.2 10*3/uL (ref 4.0–10.5)
nRBC: 0 % (ref 0.0–0.2)
nRBC: 0 % (ref 0.0–0.2)

## 2019-02-09 LAB — BASIC METABOLIC PANEL
Anion gap: 8 (ref 5–15)
BUN: 73 mg/dL — ABNORMAL HIGH (ref 8–23)
CO2: 18 mmol/L — ABNORMAL LOW (ref 22–32)
Calcium: 7.6 mg/dL — ABNORMAL LOW (ref 8.9–10.3)
Chloride: 115 mmol/L — ABNORMAL HIGH (ref 98–111)
Creatinine, Ser: 1.72 mg/dL — ABNORMAL HIGH (ref 0.61–1.24)
GFR calc Af Amer: 39 mL/min — ABNORMAL LOW (ref >60)
GFR calc non Af Amer: 34 mL/min — ABNORMAL LOW (ref >60)
Glucose, Bld: 200 mg/dL — ABNORMAL HIGH (ref 70–99)
Potassium: 4.4 mmol/L (ref 3.5–5.1)
Sodium: 141 mmol/L (ref 135–145)

## 2019-02-09 LAB — GLUCOSE, CAPILLARY
Glucose-Capillary: 104 mg/dL — ABNORMAL HIGH (ref 70–99)
Glucose-Capillary: 105 mg/dL — ABNORMAL HIGH (ref 70–99)
Glucose-Capillary: 132 mg/dL — ABNORMAL HIGH (ref 70–99)
Glucose-Capillary: 135 mg/dL — ABNORMAL HIGH (ref 70–99)
Glucose-Capillary: 135 mg/dL — ABNORMAL HIGH (ref 70–99)
Glucose-Capillary: 187 mg/dL — ABNORMAL HIGH (ref 70–99)

## 2019-02-09 LAB — MAGNESIUM: Magnesium: 2.1 mg/dL (ref 1.7–2.4)

## 2019-02-09 MED ORDER — INSULIN ASPART 100 UNIT/ML ~~LOC~~ SOLN
2.0000 [IU] | SUBCUTANEOUS | Status: DC
  Administered 2019-02-09 – 2019-02-13 (×7): 2 [IU] via SUBCUTANEOUS

## 2019-02-09 NOTE — Progress Notes (Signed)
NAME:  Garrett Henson, MRN:  [id number], DOB:  1924-07-23, LOS: 4 ADMISSION DATE:  02/11/2019, CONSULTATION DATE:  01/28/2019 REFERRING MD:  Dr. Cheral Marker, CHIEF COMPLAINT:  AMS  Brief History   31 yoM presenting from home after being found down in yard presenting as code stroke with altered mental status.  Rapid decline in mental status with EMS.  LSW 1645.  Required intubation on arrival to ER for airway protection with GCS 3 and right gaze.  CTH neg.  tPA given.  CTA deferred due to iodine allergy.  Taken for stat MRI.  Neurology consulting, PCCM asked to admit.   History of present illness   HPI obtained from medical chart review as patient is intubated and sedated.  Chart pending merger with MRN [id number].    85 year old male with prior history of HTN, DM, GERD, CAD on ASA/ plavix, HLD and wedge resection of non-small cell stage I a carcinoma the lung from the right lower lobe presenting in 2017.   Reportedly working outside in his yard today in his normal state of health when found down in yard by his wife after doing yard work.  Last known well 1615.  Found by EMS awake and able to answer questions with some facial abrasions.  Had rapid decline in mental status with EMS.  Upon arrival to ER, GCS declined to 3 with rightward fixed gaze and required intubation for airway protection. No seizure activity reported.  CTH negative.  CT maxiofacial, cervical, and pelvis XR negative for trauma workup.  Was hypertensive 184/83 treated with IV labetalol.  Labs noted for glucose 188, LA 2, Hgb 11.9, and normal coags. CXR with satisfactory ETT placement and without acute process.  EKG non-acute, SR but abnormal Rwave changes, minimal ST depression.  Due to prior severe iodine allergy, CT with contrast deferred.  Neurology concerned for possibility of basilar artery thrombosis with acute brainstem stroke, therefore patient given tPA.  Patient taken for emergent MRI, Neurology consulting and PCCM to admit.    Past Medical History   Past Medical History:  Diagnosis Date  . Arthritis   . Back pain   . Coronary artery disease    takes Plavix daily  . Diabetes mellitus    takes Metformin daily  . GERD (gastroesophageal reflux disease)    takes tagamet daily  . Headache    HX MIGRAINES  . History of blood transfusion    no abnormal reaction noted  . History of colon polyps    benign  . History of migraine    many yrs ago  . Hyperlipidemia    takes Crestor daily  . Hypertension    takes Amlodipine daily  . Joint swelling   . Lung mass 12/01/2014  . Lung mass 12/01/2014  . Macular degeneration    wet and on the left;injection 01/20/15  . Nocturia   . Overactive bladder    takes Myrbetriq daily  . Prostate cancer ([place])   . Stroke ([place])   . TIA (transient ischemic attack)   . Urinary frequency   . Urinary urgency   . Vertigo    takes Antivert daily as needed        Patient Active Problem List   Diagnosis Date Noted  . Preop cardiovascular exam 03/02/2015  . Cerebrovascular disease 03/02/2015  . Hyperlipidemia 03/02/2015  . Lung cancer ([place]) 01/26/2015  . DM2 (diabetes mellitus, type 2) ([place]) 04/23/2011  . CAD (coronary artery disease) 04/23/2011  . Stroke ([place]) 04/22/2011  .  Chest pain 04/22/2011  . Hypertension     Significant Hospital Events   9/23 Admit  Consults:  Neurology  Procedures:  9/23 ETT >>  Significant Diagnostic Tests:  9/23 CTH > 1. No acute intracranial abnormality. 2. Paranasal sinus disease particularly in the sphenoid sinus. 3. Question encephalomalacia in the right parietal-occipital region which could be related to old infarct. CT maxillofacial 9/23 > 1. No acute facial bone fracture. 2. Gas and fluid in the sphenoid sinus may be related to recent intubation. Mild paranasal sinus disease. CT cervical 9/23 > 1. No acute bone abnormality in cervical spine. 2. Prominent scarring at the right lung apex is only  partially imaged. 9/23 Pelvis XR > neg 9/23 MRI brain / angio > Negative intracranial MRA with wide patency of the large and medium sized vessels of the intracranial circulation. No hemodynamically significant or correctable stenosis.  Micro Data:  9/23 SARS Cov2 > neg  Antimicrobials:  None.  Interim history/subjective:  No changes or acute events in last 24 hours. Remains off sedation. Intermittent response to pain. Family confusion regarding prognosis.  Objective   Blood pressure (!) 146/50, pulse 70, temperature 99.7 F (37.6 C), resp. rate (!) 25, height 5\' 10"  (1.778 m), weight 75.7 kg, SpO2 92 %.    Vent Mode: PSV;CPAP FiO2 (%):  [40 %] 40 % Set Rate:  [16 bmp] 16 bmp Vt Set:  [480 mL] 480 mL PEEP:  [5 cmH20] 5 cmH20 Pressure Support:  [10 cmH20] 10 cmH20 Plateau Pressure:  [15 cmH20-20 cmH20] 15 cmH20   Intake/Output Summary (Last 24 hours) at 02/09/2019 4174 Last data filed at 02/09/2019 0800 Gross per 24 hour  Intake 3069.66 ml  Output 750 ml  Net 2319.66 ml   Filed Weights   01/19/2019 1935 02/06/19 0035 02/09/19 0354  Weight: 74.4 kg 73.9 kg 75.7 kg   Examination: General:  Elderly male on vent  HEENT: ETT/OG in place, minimal secretions. Neuro:  -cough/-gag, pupils 41mm on right and nonreactive;  74mm left; no corneal reflexes; disconjugate gaze with R eye down, L eye midline-to-left; triple flexing--> overt posturing with nox stim upper and lower. No ankle clonus; left + babinski CV: Atrial fibrillation, no r/m/g PULM:  No ventilator asynchrony, chest clear to auscultation.  GI: foley present  Extremities: -edema  Skin: no rashes, skin bruising to bilateral arms- wrapped     Resolved Hospital Problem list    Assessment & Plan:   Critically ill due to acute respiratory insufficiency requiring mechanical ventilation.  - Neuro status precludes extubation at this time.    Acute encephalopathy MRI with extensive ischemic changes in bilateral cerebral and  cerebellar hemispheres with involvement of bilateral thalami and right lateral medulla concerning for hypoxia vs hypoperfusion ischemia  Extensive ischemia on CT  Consistent with HIE/watershed infarction. EEG shows continuous diffuse slowing. May still have some recovery. - Continue secondary stroke prevention. - Keep off sedation. --substantial decline in neuro status indicative of poor outcome. CT scan ordered by Dr. Jaynee Eagles, will ask for palliative care to assist family with decision making and support.  Elevation in troponin consistent with type 2 myocardial injury.  -Medical management only given age and co-morbidities especially considering mental status.  Acute kidney injury Echocardiogram doesn't show volume overload.  May still be volume responsive.  Suspect patient has established ATN - Fluid challenge. - Follow urine output and creatinine  F/E/N: Monitor and replete as necessary. Continue TF   Best practice:  Diet: NPO - tube feeds.  Pain/Anxiety/Delirium protocol (if indicated): none VAP protocol (if indicated): yes DVT prophylaxis: s/p tPA GI prophylaxis: PPI Glucose control: SSI + Lantus for uncontrolled T2DM Mobility: BR Code Status: Limited- NO CPR, ok with peripheral pressors.  Wife states patient has known prior documents / wishes.  Will continue with full support/ measures but in the event of cardiac arrest, No CPR Family Communication: Wife, Vickii Chafe [phone]) and daughter Jacqlyn Larsen [phone]). Prognosis poor. Disposition: Neuro ICU   Labs   CBC: Recent Labs  Lab 01/18/2019 1747  02/06/19 0105 02/06/19 0429 02/06/19 0915 02/07/19 0556 02/09/19 0720  WBC 8.3  --  16.7*  --  14.0* 23.6* 10.7*  HGB 11.9*   < > 13.2 12.9* 13.0 11.2* 8.6*  HCT 37.5*   < > 40.9 38.0* 39.4 33.9* 26.5*  MCV 84.5  --  84.7  --  82.1 82.9 83.6  PLT 204  --  212  --  233 203 159   < > = values in this interval not displayed.    Basic Metabolic Panel: Recent Labs  Lab  02/11/2019 1747 01/15/2019 1813  02/09/2019 2213 02/06/19 0105 02/06/19 0429 02/06/19 1427 02/07/19 0556 02/07/19 1629 02/09/19 0252  NA 136 136   < >  --  134* 134*  --  135 137 141  K 4.0 3.9   < >  --  4.1 4.2  --  4.7 4.5 4.4  CL 103 104  --   --  103  --   --  106 113* 115*  CO2 20*  --   --   --  17*  --   --  17* 14* 18*  GLUCOSE 188* 191*  --   --  233*  --   --  243* 257* 200*  BUN 22 24*  --   --  25*  --   --  47* 60* 73*  CREATININE 1.15 1.00  --   --  1.19  --   --  2.23* 2.22* 1.72*  CALCIUM 8.8*  --   --   --  8.6*  --   --  7.9* 7.7* 7.6*  MG  --   --   --  2.0  --   --  2.1 2.0 2.1 2.1  PHOS  --   --   --  2.9  --   --  3.9 3.4 2.4*  --    < > = values in this interval not displayed.   GFR: Estimated Creatinine Clearance: 28.9 mL/min (A) (by C-G formula based on SCr of 1.72 mg/dL (H)). Recent Labs  Lab 01/15/2019 1747 02/08/2019 2213 02/06/19 0105 02/06/19 0915 02/07/19 0556 02/09/19 0720  WBC 8.3  --  16.7* 14.0* 23.6* 10.7*  LATICACIDVEN 2.0* 2.1*  --   --   --   --     Liver Function Tests: Recent Labs  Lab 01/14/2019 1747  AST 24  ALT 11  ALKPHOS 73  BILITOT 0.3  PROT 6.7  ALBUMIN 3.6   No results for input(s): LIPASE, AMYLASE in the last 168 hours. No results for input(s): AMMONIA in the last 168 hours.  ABG    Component Value Date/Time   PHART 7.316 (L) 02/06/2019 0429   PCO2ART 33.5 02/06/2019 0429   PO2ART 156.0 (H) 02/06/2019 0429   HCO3 17.2 (L) 02/06/2019 0429   TCO2 18 (L) 02/06/2019 0429   ACIDBASEDEF 8.0 (H) 02/06/2019 0429   O2SAT 99.0 02/06/2019 0429     Coagulation Profile: Recent Labs  Lab 02/04/2019 1747  INR 1.0    Cardiac Enzymes: Recent Labs  Lab 02/07/19 1037  CKTOTAL 287    HbA1C: Hgb A1c MFr Bld  Date/Time Value Ref Range Status  02/06/2019 01:05 AM 7.1 (H) 4.8 - 5.6 % Final    Comment:    (NOTE) Pre diabetes:          5.7%-6.4% Diabetes:              >6.4% Glycemic control for   <7.0% adults with  diabetes     CBG: Recent Labs  Lab 02/08/19 1549 02/08/19 1929 02/08/19 2302 02/09/19 0321 02/09/19 0726  GLUCAP 131* 117* 164* 187* 135*     CRITICAL CARE Performed by: Bonna Gains   I have independently seen and examined the patient, reviewed data, and developed an assessment and plan. A total of 35 minutes were spent in critical care assessment and medical decision making. This critical care time does not reflect procedure time, or teaching time or supervisory time of PA/NP/Med student/Med Resident, etc but could involve care discussion time.  Bonna Gains, MD PhD 02/09/19 9:14 AM

## 2019-02-09 NOTE — Progress Notes (Signed)
Palliative:   Chart reviewed. No family at bedside. I called and left a message for Mrs. Callaway requesting a return call to arrange a time to meet with her and her children for [place] discussion.   Thank you for this consult.   Mariana Kaufman, AGNP-C Palliative Medicine  Please call Palliative Medicine team phone with any questions [phone]. For individual providers please see AMION.

## 2019-02-09 NOTE — Progress Notes (Addendum)
STROKE TEAM PROGRESS NOTE   INTERVAL HISTORY Family not at bedside. Pt still intubated, not open eyes on voice or pain. Posturing in the uppers, triple flex in lowers.diffuse hypoxic brain edema, prognosis very poor, extension posturing, triple flex lowers, right pupil unreactive today. Dr. Maryjean Ka spoke with wife, showed her new images, daughter has more understanding of prognosis. Family wants to come for meeting. Palliative care consulted.  Vitals:   02/09/19 1500 02/09/19 1533 02/09/19 1600 02/09/19 1700  BP: (!) 141/43  (!) 132/43 (!) 148/43  Pulse: 74  71 72  Resp: 18  15 15   Temp: 99.3 F (37.4 C)  99.7 F (37.6 C) 99.7 F (37.6 C)  TempSrc:      SpO2: 100% 100% 100% 100%  Weight:      Height:        CBC:  Recent Labs  Lab 02/09/19 0720 02/09/19 1303  WBC 10.7* 9.2  HGB 8.6* 7.6*  HCT 26.5* 24.0*  MCV 83.6 84.5  PLT 159 242    Basic Metabolic Panel:  Recent Labs  Lab 02/07/19 0556 02/07/19 1629 02/09/19 0252  NA 135 137 141  K 4.7 4.5 4.4  CL 106 113* 115*  CO2 17* 14* 18*  GLUCOSE 243* 257* 200*  BUN 47* 60* 73*  CREATININE 2.23* 2.22* 1.72*  CALCIUM 7.9* 7.7* 7.6*  MG 2.0 2.1 2.1  PHOS 3.4 2.4*  --    Lipid Panel:     Component Value Date/Time   CHOL 158 02/06/2019 0105   TRIG 53 02/06/2019 0105   HDL 42 02/06/2019 0105   CHOLHDL 3.8 02/06/2019 0105   VLDL 11 02/06/2019 0105   LDLCALC 105 (H) 02/06/2019 0105   HgbA1c:  Lab Results  Component Value Date   HGBA1C 7.1 (H) 02/06/2019   Urine Drug Screen: No results found for: LABOPIA, COCAINSCRNUR, LABBENZ, AMPHETMU, THCU, LABBARB  Alcohol Level     Component Value Date/Time   ETH <10 02/11/2019 1747    IMAGING  Ct Head Wo Contrast 02/06/2019 IMPRESSION:  1. Again demonstrated are extensive ischemic changes involving the bilateral cerebral and cerebellar hemispheres with involvement of the bilateral thalami and right lateral medulla. The frontoparietal ischemic changes are unchanged in  distribution although somewhat increased in extent as compared to brain MRI 01/27/2019.  2. A few punctate foci of hyperdensity along the ischemic regions may reflect petechial hemorrhage.  3. Background generalized parenchymal atrophy and chronic small vessel ischemic disease. 4. Paranasal sinus air-fluid levels.  Trace right mastoid effusion.   Vas [place] Lower Extremity Venous (dvt) 02/08/2019 Summary:  Right: Findings consistent with chronic deep vein thrombosis involving the distal right femoral vein. No evidence of Acute DVT. No cystic structure found in the popliteal fossa.  Left: There is no evidence of deep vein thrombosis in the lower extremity. No cystic structure found in the popliteal fossa.     PHYSICAL EXAM   Temp:  [98.8 F (37.1 C)-101.7 F (38.7 C)] 99.7 F (37.6 C) (09/27 1700) Pulse Rate:  [41-131] 72 (09/27 1700) Resp:  [0-25] 15 (09/27 1700) BP: (101-162)/(37-110) 148/43 (09/27 1700) SpO2:  [92 %-100 %] 100 % (09/27 1700) FiO2 (%):  [40 %] 40 % (09/27 1533) Weight:  [75.7 kg] 75.7 kg (09/27 0354)  General - Well nourished, well developed, intubated off sedation.  Ophthalmologic - fundi not visualized due to noncooperation.  Cardiovascular - Regular rate and rhythm.  Neuro - intubated off sedation, eyes closed, not following commands. With forced eye opening,  eyes in mid position, not blinking to visual threat, doll's eyes present, not tracking, right pupil unreactive 1-68mm. Left reactive 2-3 mm. Corneal reflex absent right eye, gag and cough absent. Breathing over the vent.  Facial symmetry not able to test due to ET tube.  Tongue protrusion not cooperative. On pain stimulation, BUE extension posturing, and BLE triple flex. Sensation, coordination and gait not tested.   ASSESSMENT/PLAN [name] is a 85 y.o. male with history of HTN, HLD, CAD, prostate cancer s/p resection and non-small cell lung cancer found down a&o x 4 and became unresponsive with R  gaze deviation. Intubated in ED. Received tPA 02/09/2019 at [place].    Diffuse hypoxic brain edema, prognosis very poor, extension posturing, triple flex, right pupil unreactive today, cough and gag negative, right corneal absent. Palliative consulted to talk to family.   Stroke: bilaterally diffuse supra- and infratentorial infarcts - pattern consistent with cerebral hypoperfusion with hypoxic ischemic encephalopathy s/p IV tPA, concerning for cardiac source   CT head No acute abnormality. R parietal / occipital encephalomalacia.   MRI  Extensive ischemic changes B cerebral and cerebellar hemispheres, B thalami and R lateral ventricle c/w anoxic brain injury/hypoperfusion.   MRA  Unremarkable   Carotid Doppler unremarkable  2D Echo EF 60-65%  CT head extensive B cerebral and cerebellar, B thalamic and R lateral medullary ischemic changes more prominent than MRI. Few punctate areas of petechial hemorrhage. background parenchymal atrophy and small vessel disease. Sinus AF levels.  EEG no seizure, cortical dysfunction in right hemisphere as well as severe diffuse encephalopathy, likely sec to diffuse anoxic/hypoxic brain injury.  LDL 105  HgbA1c 7.1  SCDs for VTE prophylaxis  aspirin 81 mg daily and clopidogrel 75 mg daily prior to admission, now on aspirin 325 mg daily.    Therapy recommendations:  pending   Disposition:  pending   Dr. Erlinda Hong had a long discussion with wife and daughter, they would like to give pt more days to see how he improves, if no improvement over the next several days, they will make decision for more comfort at that time. Wife was at bedside, was very happy he was moving but could not really understand that posturing is a reflex and not a sign of improvement. Will give it a few more days, posturing worse today and triple flex may get worse and we can discuss again with family.    Poor prognosis, Palliative consulted.  NSTEMI vs. ? Cardiac arrest  EKG no  STEMI  Troponin 789->717->680->619->543->466  MRI stroke pattern concerning for global hypoperfusion  EF 60-65%  No need cardiology consult as per CCM  Stress ulcer  Large amount of coffee ground emesis from OG  On PPI infusion  Hb 13.2->12.9->11.2->8.6 (repeat CBC later today)  Close monitoring of GI bleed  Respiratory failure  Intubated for airway protection  On weaning trial  Wean off ventilation as able  CCM on board  Hypertension  Stable  Initially treated with cleviprex, now off BP goal < 180/105 x 24h following tPA administration  Hyperlipidemia  Home meds:  Crestor 10  LDL 105, goal < 70  Increase Crestor to 20  Continue statin at discharge  Diabetes type II Uncontrolled  HgbA1c 7.1, goal < 7.0  DB RN following  On lantus   CBGs  SSI  Dysphagia . Secondary to cerebral event  . NPO  Other Stroke Risk Factors  Advanced age  Coronary artery disease on aspirin and plavix  Migraines  Other  Active Problems  RLL non small cell carcinoma stage 1  Hx prostate cancer  Macular degeneration  Leukocytosis WBC 8.3->16.7->14.0->23.6->10.7  AKI Cre 1.19-> 2.2->1.72  T Max - 101.4->101.7  Mild tachycardia - 96-108  Chronic DVT Rt femoral vein - less likely to embolize than acute DVT - anticoagulation would be risky with recent GI bleeding - could consider IVC filter if family is committed to aggressive care. On low dose Lovenox and SCDs. CBC in AM. (Dr Maryjean Ka does not feel anticoagulation would be prudent at this time; however, an IVC filter would be an option if the family wanted to pursue this course.)  Hyperglycemia - may need to increase insulin  One pupil noted to be non-reactive today -> stat Head [facility] day # 4  This patient is critically ill due to diffuse bilateral strokes, elevated troponin, GI bleeding, coma and at significant risk of neurological worsening, death form recurrent stroke, hemorrhagic conversion,  cerebral edema, brain herniation, seizure. This patient's care requires constant monitoring of vital signs, hemodynamics, respiratory and cardiac monitoring, review of multiple databases, neurological assessment, discussion with family, other specialists and medical decision making of high complexity. I spent 30 minutes of neurocritical care time in the care of this patient.      To contact Stroke Continuity provider, please refer to [email or web address] After hours, contact General Neurology

## 2019-02-09 NOTE — Progress Notes (Signed)
Spoke in depth with Garrett Henson earlier today. Reviewed clinical findings of worsening neurologic exam, and today's CT with her. I indicated that sadly,  the prognosis for any type of recovery is exceedingly unlikely. I note that palliative care has been consulted for support, but encouraged her to contact her children (they have 6), and arrange for a family meeting so that they can meet, understand the poor/grave prognosis and make decisions together.   Bonna Gains, MD PhD 02/09/19 2:35 PM

## 2019-02-09 NOTE — Progress Notes (Signed)
Patient was transported to CT and back without incident. The patient is back in his room and resting on his ordered vent settings.

## 2019-02-10 DIAGNOSIS — J9601 Acute respiratory failure with hypoxia: Secondary | ICD-10-CM

## 2019-02-10 DIAGNOSIS — G931 Anoxic brain damage, not elsewhere classified: Secondary | ICD-10-CM

## 2019-02-10 DIAGNOSIS — R4182 Altered mental status, unspecified: Secondary | ICD-10-CM

## 2019-02-10 DIAGNOSIS — Z515 Encounter for palliative care: Secondary | ICD-10-CM

## 2019-02-10 DIAGNOSIS — Z7189 Other specified counseling: Secondary | ICD-10-CM

## 2019-02-10 DIAGNOSIS — I639 Cerebral infarction, unspecified: Secondary | ICD-10-CM

## 2019-02-10 LAB — GLUCOSE, CAPILLARY
Glucose-Capillary: 91 mg/dL (ref 70–99)
Glucose-Capillary: 78 mg/dL (ref 70–99)
Glucose-Capillary: 113 mg/dL — ABNORMAL HIGH (ref 70–99)
Glucose-Capillary: 85 mg/dL (ref 70–99)
Glucose-Capillary: 87 mg/dL (ref 70–99)
Glucose-Capillary: 72 mg/dL (ref 70–99)

## 2019-02-10 LAB — CBC
HCT: 25.3 % — ABNORMAL LOW (ref 39.0–52.0)
Hemoglobin: 8.1 g/dL — ABNORMAL LOW (ref 13.0–17.0)
MCH: 26.7 pg (ref 26.0–34.0)
MCHC: 32 g/dL (ref 30.0–36.0)
MCV: 83.5 fL (ref 80.0–100.0)
Platelets: 187 10*3/uL (ref 150–400)
RBC: 3.03 MIL/uL — ABNORMAL LOW (ref 4.22–5.81)
RDW: 15.9 % — ABNORMAL HIGH (ref 11.5–15.5)
WBC: 11.9 10*3/uL — ABNORMAL HIGH (ref 4.0–10.5)
nRBC: 0 % (ref 0.0–0.2)

## 2019-02-10 MED ORDER — MORPHINE SULFATE (PF) 2 MG/ML IV SOLN
2.0000 mg | Freq: Four times a day (QID) | INTRAVENOUS | Status: DC
  Administered 2019-02-10 – 2019-02-14 (×16): 2 mg via INTRAVENOUS
  Filled 2019-02-10 (×16): qty 1

## 2019-02-10 NOTE — Progress Notes (Signed)
SLP Cancellation Note  Patient Details Name: Garrett Henson MRN: [id number] DOB: 1925-02-25   Cancelled treatment:       Reason Eval/Treat Not Completed: Medical issues which prohibited therapy;Patient not medically ready(Pt remains intubated at this time. SLP will follow up. )  Lailanie Hasley I. Hardin Negus, [place], [place] Office number [phone] Pager [place] 02/10/2019, 8:46 AM

## 2019-02-10 NOTE — Consult Note (Signed)
Consultation Note Date: 02/10/2019   Patient Name: Garrett Henson  DOB: 1924/10/20  MRN: [id number]  Age / Sex: 85 y.o., male  PCP: Lorene Dy, MD Referring Physician: Collene Gobble, MD  Reason for Consultation: Establishing goals of care  HPI/Patient Profile: 85 y.o. male  with past medical history of CAD, DM, HTN, RLL NSCLC Stage I admitted on 02/07/2019 with code stroke after being found down in his yard with R fixed gaze, declining respiratory status- requring intubation. He was given TPA. CT head and MRA Head were negative. MRI showed "extensive ischemic changes involving the bilateral cerebral and cerebellar hemispheres, as well as involvment of the bilateral thalami and R lateral medulla- findings most consistent with hypoperfusion injury/cerebral anoxic event". Mental status has not improved since admission. Palliative consulted for [place].   Clinical Assessment and Goals of Care:  I have reviewed medical records including EPIC notes, labs and imaging, received report from Kennieth Rad, [place] with PCCM, assessed the patient and then met at the bedside along with patient's spouse and one of their sons  to discuss diagnosis prognosis, GOC, EOL wishes, disposition and options.  I introduced Palliative Medicine as specialized medical care for people living with serious illness. It focuses on providing relief from the symptoms and stress of a serious illness. The goal is to improve quality of life for both the patient and the family.  We discussed a brief life review of the patient. He is retired. 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 Has six children. Prior to admission was continuing to keep up his yard and the yard of a rental home. He and his spouse have been married for over 44 years.   We discussed her current illness and what it means in the larger context of her on-going co-morbidities.   Natural disease trajectory and expectations at EOL were discussed. Mrs. Waskey was able to verbalize that it was not expected that Mr. Smaldone would "wake up and be himself again". She noted that while she maintained some hope that a miracle would happen- she is realistic in her understanding that her husband is at end of life. Patient's son and Mrs Bart expressed that they would not want patient to suffer and would wish for him to die peacefully and without suffering.   We discussed the difference between continuing ongoing life supportive measures and aggressive medical therapies that are intended to prolong the time of keeping Mr. Eagles alive. Comfort care and withdrawing life prolonging support was also discussed.   Family asked questions regarding his prognosis after transition to comfort care was made - we discussed this could be an immediate passing or he could continue to live for hours to days- regardless he would be kept comfortable and supported through a natural dying process.   Family maintains that they are in agreement with transitioning patient to full comfort care. Their main [place] at this point is to ensure that all of his children are allowed time to visit with him prior to making that transition.  There are two children who cannot get to [facility] until Friday. We discussed concerns that much can happen in four days- patient may decline before then and would not recommend escalating care in that case. Family is in agreement. We also discussed beginning to provide some comfort with scheduled pain medication in the interim and they are in agreement with this as well.   Questions and concerns were addressed.  Hard Choices booklet left for review. The family was encouraged to call with questions or concerns.   Primary Decision Maker NEXT OF KIN- spouse- Peggy Cuthbertson   SUMMARY OF RECOMMENDATIONS -Continue current care without escalation -DNR code status -Morphine 49m IV qhr  -Plan for  one way extubation and transition to full comfort care on Friday (or sooner if family is all able to visit before then- I did encourage family to try and get here before Friday if possible)   Code Status/Advance Care Planning:  DNR  Palliative Prophylaxis:   Frequent Pain Assessment  Additional Recommendations (Limitations, Scope, Preferences):  Minimize Medications, No Hemodialysis, No Surgical Procedures and No Tracheostomy  Psycho-social/Spiritual:   Desire for further Chaplaincy support:yes  Additional Recommendations: Grief/Bereavement Support  Prognosis:    < 2 weeks  Discharge Planning: Anticipated Hospital Death  Primary Diagnoses: Present on Admission: . Stroke (cerebrum) ([place] . Acute cerebrovascular accident (CVA) ([place]   I have reviewed the medical record, interviewed the patient and family, and examined the patient. The following aspects are pertinent.  No past medical history on file. Social History   Socioeconomic History  . Marital status: Married    Spouse name: Not on file  . Number of children: Not on file  . Years of education: Not on file  . Highest education level: Not on file  Occupational History  . Not on file  Social Needs  . Financial resource strain: Not on file  . Food insecurity    Worry: Not on file    Inability: Not on file  . Transportation needs    Medical: Not on file    Non-medical: Not on file  Tobacco Use  . Smoking status: Not on file  Substance and Sexual Activity  . Alcohol use: Not on file  . Drug use: Not on file  . Sexual activity: Not on file  Lifestyle  . Physical activity    Days per week: Not on file    Minutes per session: Not on file  . Stress: Not on file  Relationships  . Social [place] phone: Not on file    Gets together: Not on file    Attends religious service: Not on file    Active member of club or organization: Not on file    Attends meetings of clubs or organizations: Not  on file    Relationship status: Not on file  Other Topics Concern  . Not on file  Social History Narrative  . Not on file   No family history on file. Scheduled Meds: .  stroke: mapping our early stages of recovery book   Does not apply Once  . aspirin  325 mg Per Tube Daily  . chlorhexidine gluconate (MEDLINE KIT)  15 mL Mouth Rinse BID  . Chlorhexidine Gluconate Cloth  6 each Topical Daily  . enoxaparin (LOVENOX) injection  30 mg Subcutaneous Q24H  . feeding supplement (PRO-STAT SUGAR FREE 64)  30 mL Per Tube Daily  . insulin aspart  2-6 Units Subcutaneous Q4H  . insulin glargine  20 Units Subcutaneous BID  . mouth rinse  15 mL Mouth Rinse 10 times per day  . pantoprazole sodium  40 mg Per Tube Daily  . rosuvastatin  20 mg Per Tube Daily   Continuous Infusions: . sodium chloride 75 mL/hr at 02/10/19 0900  . feeding supplement (VITAL AF 1.2 CAL) 55 mL/hr at 02/10/19 0900   PRN Meds:.acetaminophen (TYLENOL) oral liquid 160 mg/5 mL, [DISCONTINUED] acetaminophen **OR** [DISCONTINUED] acetaminophen (TYLENOL) oral liquid 160 mg/5 mL **OR** acetaminophen, ondansetron, senna-docusate Medications Prior to Admission:  Prior to Admission medications   Medication Sig Start Date End Date Taking? Authorizing Provider  aspirin EC 81 MG tablet Take 81 mg by mouth at bedtime.   Yes [provider]  clopidogrel (PLAVIX) 75 MG tablet Take 75 mg by mouth daily.   Yes [provider]  diclofenac sodium (VOLTAREN) 1 % GEL Apply 4 g topically 4 (four) times daily as needed (to painful sites).  01/01/19  Yes [provider]  lisinopril (ZESTRIL) 10 MG tablet Take 10 mg by mouth daily. 02/02/19  Yes [provider]  meclizine (ANTIVERT) 25 MG tablet Take 25 mg by mouth 2 (two) times daily as needed for dizziness. 11/24/18  Yes [provider]  Multiple Vitamins-Minerals (ONE-A-DAY PROACTIVE 65+) TABS Take 1 tablet by mouth daily with breakfast.   Yes [provider]  rosuvastatin (CRESTOR) 10 MG tablet Take 10 mg by mouth daily. 02/02/19  Yes [provider]  triamcinolone cream (KENALOG) 0.1 % Apply 1 application topically 2 (two) times daily as needed (for itching).  09/21/18  Yes [provider]  trimethoprim-polymyxin b (POLYTRIM) ophthalmic solution Place 1 drop into both eyes See admin instructions. INSTILL ONE DROP INTO BOTH EYES 4 TIMES A DAY FOR 2 DAYS AFTER EACH MONTHLY EYE INJECTION (both eyes) 09/05/18  Yes [provider]  metFORMIN (GLUCOPHAGE) 1000 MG tablet Take 1,000 mg by mouth 2 (two) times daily. 12/31/18   [provider]   Allergies  Allergen Reactions  . Iodine Shortness Of Breath and Other (See Comments)    Patient's wife stated he had a very profound reaction to this and was told by medical personnel he was to "[place]"   Review of Systems  Unable to perform ROS: Intubated    Physical Exam Vitals signs and nursing note reviewed.  Constitutional:      Appearance: He is normal weight.  Cardiovascular:     Rate and Rhythm: Normal rate.  Pulmonary:     Comments: intubated Skin:    Coloration: Skin is pale.  Neurological:     Comments: Does not respond to voice or touch     Vital Signs: BP (!) 144/51   Pulse 69   Temp 99.7 F (37.6 C)   Resp 15   Ht _0  (1.778 m)   Wt 82.9 kg   SpO2 100%   BMI 26.22 kg/m  Pain Scale: CPOT       SpO2: SpO2: 100 % O2 Device:SpO2: 100 % O2 Flow Rate: .   IO: Intake/output summary:   Intake/Output Summary (Last 24 hours) at 02/10/2019 1225 Last data filed at 02/10/2019 0900 Gross per 24 hour  Intake 2991.02 ml  Output 1335 ml  Net 1656.02 ml    LBM: Last BM Date: 02/08/19 Baseline Weight: Weight: 72.6 kg Most recent weight: Weight: 82.9 kg     Palliative Assessment/Data: PPS: 10%     Thank you for this consult. Palliative medicine  will continue to follow and assist as needed.   Time In:1200, 1300   Time Out: 1220, 1400 Time Total: 80 minutes Greater than 50%  of this time was spent counseling and coordinating care related to the above assessment and plan.  Signed by: Mariana Kaufman, AGNP-C Palliative Medicine    Please contact Palliative Medicine Team phone at [phone] for questions and concerns.  For individual provider: See Amion              .

## 2019-02-10 NOTE — Progress Notes (Signed)
NAME:  Garrett Henson, MRN:  [id number], DOB:  Oct 14, 1924, LOS: 5 ADMISSION DATE:  02/08/2019, CONSULTATION DATE:  01/15/2019 REFERRING MD:  Dr. Cheral Marker, CHIEF COMPLAINT:  AMS  Brief History   66 yoM presenting from home after being found down in yard presenting as code stroke with altered mental status.  Rapid decline in mental status with EMS.  LSW 1645.  Required intubation on arrival to ER for airway protection with GCS 3 and right gaze.  CTH neg.  tPA given.  CTA deferred due to iodine allergy.  Taken for stat MRI.  Neurology consulting, PCCM asked to admit.    Past Medical History   Past Medical History:  Diagnosis Date  . Arthritis   . Back pain   . Coronary artery disease    takes Plavix daily  . Diabetes mellitus    takes Metformin daily  . GERD (gastroesophageal reflux disease)    takes tagamet daily  . Headache    HX MIGRAINES  . History of blood transfusion    no abnormal reaction noted  . History of colon polyps    benign  . History of migraine    many yrs ago  . Hyperlipidemia    takes Crestor daily  . Hypertension    takes Amlodipine daily  . Joint swelling   . Lung mass 12/01/2014  . Lung mass 12/01/2014  . Macular degeneration    wet and on the left;injection 01/20/15  . Nocturia   . Overactive bladder    takes Myrbetriq daily  . Prostate cancer ([place])   . Stroke ([place])   . TIA (transient ischemic attack)   . Urinary frequency   . Urinary urgency   . Vertigo    takes Antivert daily as needed    Significant Hospital Events   9/23 Admit  Consults:  Neurology PMT  Procedures:  9/23 ETT >>  Significant Diagnostic Tests:  9/23 CTH > 1. No acute intracranial abnormality. 2. Paranasal sinus disease particularly in the sphenoid sinus. 3. Question encephalomalacia in the right parietal-occipital region which could be related to old infarct. CT maxillofacial 9/23 > 1. No acute facial bone fracture. 2. Gas and fluid  in the sphenoid sinus may be related to recent intubation. Mild paranasal sinus disease. CT cervical 9/23 > 1. No acute bone abnormality in cervical spine. 2. Prominent scarring at the right lung apex is only partially imaged. 9/23 Pelvis XR > neg 9/23 MRI brain / MRA >  1. Extensive ischemic changes involving the bilateral cerebral and cerebellar hemispheres, as well as involvement of the bilateral thalami and right lateral medulla. Constellation of findings felt to be most consistent with hypoperfusion injury/cerebral anoxic event.  No associated hemorrhage or significant mass effect. 2. Underlying age-related cerebral atrophy with mild chronic small vessel ischemic disease, with additional chronic ischemic changes as above.Negative intracranial MRA with wide patency of the large and medium sized vessels of the intracranial circulation. No hemodynamically significant or correctable stenosis. 3. Negative intracranial MRA with wide patency of the large and medium sized vessels of the intracranial circulation. No hemodynamically significant or correctable stenosis.  9/24 TTE >> EF 60-65% 9/27 CTH >> 1. Extensive ischemic change again noted bilaterally involving the cerebral and cerebellar hemispheres and basal ganglia. Appearance and distribution is largely stable except in the left thalamus where the edema pattern shows slight progression. 2. No evidence for new interval hemorrhage or extra-axial fluid collection. 3. Fluid again noted in the sphenoid sinuses and right mastoid  air cells.  Micro Data:  9/23 SARS Cov2 > neg 9/24 MRSA PCR >> neg  Antimicrobials:  None.  Interim history/subjective:  No changes overnight.  No sedation since propofol off on 9/23.  Objective   Blood pressure (!) 159/50, pulse 69, temperature 99.7 F (37.6 C), temperature source Bladder, resp. rate 15, height 5\' 10"  (1.778 m), weight 82.9 kg, SpO2 99 %.    Vent Mode: PSV;CPAP FiO2 (%):  [40 %] 40 % Set  Rate:  [16 bmp] 16 bmp Vt Set:  [480 mL] 480 mL PEEP:  [5 cmH20] 5 cmH20 Pressure Support:  [10 cmH20] 10 cmH20 Plateau Pressure:  [14 cmH20-16 cmH20] 15 cmH20   Intake/Output Summary (Last 24 hours) at 02/10/2019 0900 Last data filed at 02/10/2019 0800 Gross per 24 hour  Intake 3052.13 ml  Output 1610 ml  Net 1442.13 ml   Filed Weights   02/06/19 0035 02/09/19 0354 02/10/19 0400  Weight: 73.9 kg 75.7 kg 82.9 kg   Examination: General:  Elderly male unresponsive on MV HEENT: MM pink/dry, ETT, OGT, pupils 2/minimally reactive Neuro: no cough/ gag, no response elicited in upper extremities, flicker in LE > RE CV: rr ir , no murmur PULM:  Currently doing well on SBT 10/5, CTA, minimal thick tan secretions GI: soft, bs+, ND, foley Extremities: warm/dry, no edema  Skin: no rashes, abrasion to right forehead, bruising to arms  Resolved Hospital Problem list    Assessment & Plan:   Critically ill due to acute respiratory insufficiency requiring mechanical ventilation.  - Continue full MV support with daily SBT however neuro status preclueds extubation   Acute encephalopathy MRI with extensive ischemic changes in bilateral cerebral and cerebellar hemispheres with involvement of bilateral thalami and right lateral medulla concerning for hypoxia vs hypoperfusion ischemia  - repeat CT 9/27 with extensive ischemia c/w HIE/watershed infarction - Neurology following, appreciate input - not required sedation since admit - given no improvement in neuro status since 9/23, any change for meaningful functional recovery is poor.   - PMT consulted 9/27, appreciate assistance.   - pending family arrival, will need to discuss GOCs.    Elevation in troponin consistent with type 2 myocardial injury.  -ongoing medical management   Acute kidney injury- improving, suspected aspect of ATN - Continue MIVF - Trend BMP / urinary output - Replace electrolytes as indicated  DMT2  - continue SSI and  lantus  Normocytic Anemia with suspected gastritis 9/25 - no further bleeding - Hgb trend stable - trend CBC  Best practice:  Diet: NPO - tube feeds at goal  Pain/Anxiety/Delirium protocol (if indicated): not needed VAP protocol (if indicated): yes DVT prophylaxis: lovenox/ SCDs GI prophylaxis: PPI Glucose control: SSI + Lantus for uncontrolled T2DM Mobility: BR Code Status: Limited- NO CPR, ok with peripheral pressors.  Family Communication: pending family arrival this am for [place] discussion Wife, Vickii Chafe [phone]) and daughter Jacqlyn Larsen [phone]).  Disposition: Neuro ICU   Labs   CBC: Recent Labs  Lab 02/06/19 0915 02/07/19 0556 02/09/19 0720 02/09/19 1303 02/10/19 0502  WBC 14.0* 23.6* 10.7* 9.2 11.9*  HGB 13.0 11.2* 8.6* 7.6* 8.1*  HCT 39.4 33.9* 26.5* 24.0* 25.3*  MCV 82.1 82.9 83.6 84.5 83.5  PLT 233 203 159 160 102    Basic Metabolic Panel: Recent Labs  Lab 01/14/2019 1747 01/28/2019 1813  01/17/2019 2213 02/06/19 0105 02/06/19 0429 02/06/19 1427 02/07/19 0556 02/07/19 1629 02/09/19 0252  NA 136 136   < >  --  134* 134*  --  135 137 141  K 4.0 3.9   < >  --  4.1 4.2  --  4.7 4.5 4.4  CL 103 104  --   --  103  --   --  106 113* 115*  CO2 20*  --   --   --  17*  --   --  17* 14* 18*  GLUCOSE 188* 191*  --   --  233*  --   --  243* 257* 200*  BUN 22 24*  --   --  25*  --   --  47* 60* 73*  CREATININE 1.15 1.00  --   --  1.19  --   --  2.23* 2.22* 1.72*  CALCIUM 8.8*  --   --   --  8.6*  --   --  7.9* 7.7* 7.6*  MG  --   --   --  2.0  --   --  2.1 2.0 2.1 2.1  PHOS  --   --   --  2.9  --   --  3.9 3.4 2.4*  --    < > = values in this interval not displayed.   GFR: Estimated Creatinine Clearance: 28.9 mL/min (A) (by C-G formula based on SCr of 1.72 mg/dL (H)). Recent Labs  Lab 02/02/2019 1747 02/09/2019 2213  02/07/19 0556 02/09/19 0720 02/09/19 1303 02/10/19 0502  WBC 8.3  --    < > 23.6* 10.7* 9.2 11.9*  LATICACIDVEN 2.0* 2.1*  --   --   --   --    --    < > = values in this interval not displayed.    Liver Function Tests: Recent Labs  Lab 01/26/2019 1747  AST 24  ALT 11  ALKPHOS 73  BILITOT 0.3  PROT 6.7  ALBUMIN 3.6   No results for input(s): LIPASE, AMYLASE in the last 168 hours. No results for input(s): AMMONIA in the last 168 hours.  ABG    Component Value Date/Time   PHART 7.316 (L) 02/06/2019 0429   PCO2ART 33.5 02/06/2019 0429   PO2ART 156.0 (H) 02/06/2019 0429   HCO3 17.2 (L) 02/06/2019 0429   TCO2 18 (L) 02/06/2019 0429   ACIDBASEDEF 8.0 (H) 02/06/2019 0429   O2SAT 99.0 02/06/2019 0429     Coagulation Profile: Recent Labs  Lab 02/02/2019 1747  INR 1.0    Cardiac Enzymes: Recent Labs  Lab 02/07/19 1037  CKTOTAL 287    HbA1C: Hgb A1c MFr Bld  Date/Time Value Ref Range Status  02/06/2019 01:05 AM 7.1 (H) 4.8 - 5.6 % Final    Comment:    (NOTE) Pre diabetes:          5.7%-6.4% Diabetes:              >6.4% Glycemic control for   <7.0% adults with diabetes     CBG: Recent Labs  Lab 02/09/19 1537 02/09/19 2002 02/09/19 2331 02/10/19 0341 02/10/19 0744  GLUCAP 104* 135* 132* 91 78     I spent 30 minutes in direct patient care including reviewing data,  discussing with other providers, assessment, planning and stabilization and documentation. Time is exclusive to this patient and does not include procedures.    Kennieth Rad, MSN, AGACNP-BC [facility] Pgr: [phone] or if no answer [phone] 02/10/2019, 9:00 AM

## 2019-02-10 NOTE — Progress Notes (Signed)
Subjective: No significant changes  Exam: Vitals:   02/10/19 0759 02/10/19 0800  BP: (!) 160/48 (!) 159/50  Pulse: 81 69  Resp: 16 15  Temp:  99.7 F (37.6 C)  SpO2: 99% 99%   Gen: In bed, NAD Resp: non-labored breathing, no acute distress Abd: soft, nt  Neuro: MS: Does not open eyes or follow commands, does grimace to nox stim.  OL:IDCV pupil 15mm and reactive, R 88mm and reactive, absent corneal on the right, present on the left, doll's eye intact.  Motor: extensor in bilateral UE, triple flexion bilateral LE Sensory:as above.   Pertinent Labs: Creatinine 1.7  Impression: 85 yo M with extensive bilateral hypoxic brain injury. Though he was given tPA on admission for possible stroke, I do not think that this was an embolic or thrombotic stroke, but rather anoxic injury. He is now 5 days from initial insult with extensor posturing and some evidence of brain stem involvement. I think the liklihood of recovery to an independent level of function is exceedingly small.   Recommendations: 1) Agree with palliative care consultation.  2) will follow.   This patient is critically ill and at significant risk of neurological worsening, death and care requires constant monitoring of vital signs, hemodynamics,respiratory and cardiac monitoring, neurological assessment, discussion with family, other specialists and medical decision making of high complexity. I spent 35 minutes of neurocritical care time  in the care of  this patient. This was time spent independent of any time provided by nurse practitioner or PA.  Roland Rack, MD [facility] [phone]  If 7pm- 7am, please page neurology on call as listed in [place]. 02/10/2019  9:00 AM

## 2019-02-11 DIAGNOSIS — G931 Anoxic brain damage, not elsewhere classified: Principal | ICD-10-CM

## 2019-02-11 LAB — GLUCOSE, CAPILLARY
Glucose-Capillary: 78 mg/dL (ref 70–99)
Glucose-Capillary: 96 mg/dL (ref 70–99)
Glucose-Capillary: 114 mg/dL — ABNORMAL HIGH (ref 70–99)
Glucose-Capillary: 105 mg/dL — ABNORMAL HIGH (ref 70–99)
Glucose-Capillary: 121 mg/dL — ABNORMAL HIGH (ref 70–99)
Glucose-Capillary: 120 mg/dL — ABNORMAL HIGH (ref 70–99)

## 2019-02-11 MED ORDER — MORPHINE SULFATE (PF) 2 MG/ML IV SOLN
2.0000 mg | INTRAVENOUS | Status: DC | PRN
  Administered 2019-02-11 – 2019-02-12 (×3): 2 mg via INTRAVENOUS
  Filled 2019-02-11 (×3): qty 1

## 2019-02-11 NOTE — Progress Notes (Signed)
Responded to Spiritual care consult. Mrs. Hislop was a bedside and tearful. However she was feeling better during our meeting. She stated that she was finding peace with the decision made for Mr. Sharmarke who was not responsive. She stated she was very concerned about what her life will be like after he passes. She said that Mr. Mercury did everything and she didn't need to do much. She said that she loved their marriage and they had great times together.  Mrs. Mcnatt mentioned her concern for spiritual growth for her grandson Nicole Kindred). She also said that she was waiting on her daughter to come and visit from [place] to spend time with Mr. Santana. I offered family spiritual care with compassionate listening, ministry of presence, prayer, and words of encouragement. She requested I put her family prayer requests in [place] prayer book.   Palliative Care Resident Fidel Levy [phone]

## 2019-02-11 NOTE — Progress Notes (Signed)
NAME:  Garrett Henson, MRN:  [id number], DOB:  1925-02-25, LOS: 6 ADMISSION DATE:  01/31/2019, CONSULTATION DATE:  01/28/2019 REFERRING MD:  Dr. Cheral Marker, CHIEF COMPLAINT:  AMS  Brief History   4 yoM presenting from home after being found down in yard presenting as code stroke with altered mental status.  Rapid decline in mental status with EMS.  LSW 1645.  Required intubation on arrival to ER for airway protection with GCS 3 and right gaze.  CTH neg.  tPA given.  CTA deferred due to iodine allergy.  Taken for stat MRI.  Neurology consulting, PCCM asked to admit.    Past Medical History   Past Medical History:  Diagnosis Date  . Arthritis   . Back pain   . Coronary artery disease    takes Plavix daily  . Diabetes mellitus    takes Metformin daily  . GERD (gastroesophageal reflux disease)    takes tagamet daily  . Headache    HX MIGRAINES  . History of blood transfusion    no abnormal reaction noted  . History of colon polyps    benign  . History of migraine    many yrs ago  . Hyperlipidemia    takes Crestor daily  . Hypertension    takes Amlodipine daily  . Joint swelling   . Lung mass 12/01/2014  . Lung mass 12/01/2014  . Macular degeneration    wet and on the left;injection 01/20/15  . Nocturia   . Overactive bladder    takes Myrbetriq daily  . Prostate cancer ([place])   . Stroke ([place])   . TIA (transient ischemic attack)   . Urinary frequency   . Urinary urgency   . Vertigo    takes Antivert daily as needed    Significant Hospital Events   9/23 Admit  Consults:  Neurology PMT  Procedures:  9/23 ETT >>  Significant Diagnostic Tests:  9/23 CTH > 1. No acute intracranial abnormality. 2. Paranasal sinus disease particularly in the sphenoid sinus. 3. Question encephalomalacia in the right parietal-occipital region which could be related to old infarct. CT maxillofacial 9/23 > 1. No acute facial bone fracture. 2. Gas and fluid  in the sphenoid sinus may be related to recent intubation. Mild paranasal sinus disease. CT cervical 9/23 > 1. No acute bone abnormality in cervical spine. 2. Prominent scarring at the right lung apex is only partially imaged. 9/23 Pelvis XR > neg 9/23 MRI brain / MRA >  1. Extensive ischemic changes involving the bilateral cerebral and cerebellar hemispheres, as well as involvement of the bilateral thalami and right lateral medulla. Constellation of findings felt to be most consistent with hypoperfusion injury/cerebral anoxic event.  No associated hemorrhage or significant mass effect. 2. Underlying age-related cerebral atrophy with mild chronic small vessel ischemic disease, with additional chronic ischemic changes as above.Negative intracranial MRA with wide patency of the large and medium sized vessels of the intracranial circulation. No hemodynamically significant or correctable stenosis. 3. Negative intracranial MRA with wide patency of the large and medium sized vessels of the intracranial circulation. No hemodynamically significant or correctable stenosis.  9/24 TTE >> EF 60-65% 9/27 CTH >> 1. Extensive ischemic change again noted bilaterally involving the cerebral and cerebellar hemispheres and basal ganglia. Appearance and distribution is largely stable except in the left thalamus where the edema pattern shows slight progression. 2. No evidence for new interval hemorrhage or extra-axial fluid collection. 3. Fluid again noted in the sphenoid sinuses and right mastoid  air cells.  Micro Data:  9/23 SARS Cov2 > neg 9/24 MRSA PCR >> neg  Antimicrobials:  None.  Interim history/subjective:  Family discussion held yesterday with PMT, now full DNR, plan to transition to full comfort care on Friday when family arrives.  No neurological or significant changes overnight  Objective   Blood pressure (!) 151/54, pulse 73, temperature 99.1 F (37.3 C), resp. rate 10, height 5\' 10"  (1.778  m), weight 83 kg, SpO2 98 %.    Vent Mode: PRVC FiO2 (%):  [30 %-40 %] 30 % Set Rate:  [16 bmp] 16 bmp Vt Set:  [480 mL] 480 mL PEEP:  [5 cmH20] 5 cmH20 Pressure Support:  [10 cmH20] 10 cmH20 Plateau Pressure:  [12 cmH20-15 cmH20] 15 cmH20   Intake/Output Summary (Last 24 hours) at 02/11/2019 0818 Last data filed at 02/11/2019 0700 Gross per 24 hour  Intake 2974.23 ml  Output 1175 ml  Net 1799.23 ml   Filed Weights   02/09/19 0354 02/10/19 0400 02/11/19 0500  Weight: 75.7 kg 82.9 kg 83 kg   Examination: General:  Elderly male in NAD HEENT: MM pink/dry, pupils 2/sluggish, ETT/ OGT Neuro: some extension in upper extremities to stimuli otherwise unresponsive CV: rr, no murmur PULM: coarse  GI: +bs, soft, foley in place Extremities: warm/dry, generalized +1 edema  Skin: no rashes, bruises to arms, healing abrasion to forehead  Resolved Hospital Problem list    Assessment & Plan:   Acute respiratory insufficiency Extensive bilateral hypoxic brain injury  Acute kidney injury DMT2  Normocytic Anemia with suspected gastritis 9/25 - no improvement or changes in neuro exam since admit on 9/23 and has not required any sedation.  MRI/ CTH confirms diffuse anoxic injury.  Prognosis for any meaningful recovery is poor.   P:  -  Appreciate palliative care assistance.  Patient is full DNR.  Continuing current care for now with plans to transfer to full comfort care and one way extubation on Friday when remaining family arrive. -  Continue full MV support, daily SBT as tolerated but no extubation  -  Schedule morphine added 9/28 for presumed pain and will add PRN as needed -  Prn tylenol  -  Continue TF, CBG w/SSI  Best practice:  Diet: NPO/ TF Pain/Anxiety/Delirium protocol (if indicated): morphine q 6 and prn  VAP protocol (if indicated): yes DVT prophylaxis: lovenox/ SCDs GI prophylaxis: PPI Glucose control: SSI + Lantus  Mobility: BR Code Status: full DNR Family  Communication: Wife, Vickii Chafe [phone]) updated at bedside 9/28.  Disposition: ICU   Labs   CBC: Recent Labs  Lab 02/06/19 0915 02/07/19 0556 02/09/19 0720 02/09/19 1303 02/10/19 0502  WBC 14.0* 23.6* 10.7* 9.2 11.9*  HGB 13.0 11.2* 8.6* 7.6* 8.1*  HCT 39.4 33.9* 26.5* 24.0* 25.3*  MCV 82.1 82.9 83.6 84.5 83.5  PLT 233 203 159 160 662    Basic Metabolic Panel: Recent Labs  Lab 02/02/2019 1747 01/26/2019 1813  02/09/2019 2213 02/06/19 0105 02/06/19 0429 02/06/19 1427 02/07/19 0556 02/07/19 1629 02/09/19 0252  NA 136 136   < >  --  134* 134*  --  135 137 141  K 4.0 3.9   < >  --  4.1 4.2  --  4.7 4.5 4.4  CL 103 104  --   --  103  --   --  106 113* 115*  CO2 20*  --   --   --  17*  --   --  17* 14*  18*  GLUCOSE 188* 191*  --   --  233*  --   --  243* 257* 200*  BUN 22 24*  --   --  25*  --   --  47* 60* 73*  CREATININE 1.15 1.00  --   --  1.19  --   --  2.23* 2.22* 1.72*  CALCIUM 8.8*  --   --   --  8.6*  --   --  7.9* 7.7* 7.6*  MG  --   --   --  2.0  --   --  2.1 2.0 2.1 2.1  PHOS  --   --   --  2.9  --   --  3.9 3.4 2.4*  --    < > = values in this interval not displayed.   GFR: Estimated Creatinine Clearance: 28.9 mL/min (A) (by C-G formula based on SCr of 1.72 mg/dL (H)). Recent Labs  Lab 01/28/2019 1747 02/01/2019 2213  02/07/19 0556 02/09/19 0720 02/09/19 1303 02/10/19 0502  WBC 8.3  --    < > 23.6* 10.7* 9.2 11.9*  LATICACIDVEN 2.0* 2.1*  --   --   --   --   --    < > = values in this interval not displayed.    Liver Function Tests: Recent Labs  Lab 01/18/2019 1747  AST 24  ALT 11  ALKPHOS 73  BILITOT 0.3  PROT 6.7  ALBUMIN 3.6   No results for input(s): LIPASE, AMYLASE in the last 168 hours. No results for input(s): AMMONIA in the last 168 hours.  ABG    Component Value Date/Time   PHART 7.316 (L) 02/06/2019 0429   PCO2ART 33.5 02/06/2019 0429   PO2ART 156.0 (H) 02/06/2019 0429   HCO3 17.2 (L) 02/06/2019 0429   TCO2 18 (L) 02/06/2019 0429    ACIDBASEDEF 8.0 (H) 02/06/2019 0429   O2SAT 99.0 02/06/2019 0429     Coagulation Profile: Recent Labs  Lab 01/21/2019 1747  INR 1.0    Cardiac Enzymes: Recent Labs  Lab 02/07/19 1037  CKTOTAL 287    HbA1C: Hgb A1c MFr Bld  Date/Time Value Ref Range Status  02/06/2019 01:05 AM 7.1 (H) 4.8 - 5.6 % Final    Comment:    (NOTE) Pre diabetes:          5.7%-6.4% Diabetes:              >6.4% Glycemic control for   <7.0% adults with diabetes     CBG: Recent Labs  Lab 02/10/19 1538 02/10/19 1958 02/10/19 2326 02/11/19 0338 02/11/19 0809  GLUCAP 85 87 72 78 96    I spent 25 minutes in direct patient care including reviewing data,  discussing with other providers, assessment, planning and stabilization and documentation. Time is exclusive to this patient and does not include procedures.    Kennieth Rad, MSN, AGACNP-BC [facility] Pgr: [phone] or if no answer [phone] 02/11/2019, 8:18 AM

## 2019-02-11 NOTE — Progress Notes (Signed)
RT note- Placed back to full support due to low VE and RR.

## 2019-02-11 NOTE — Progress Notes (Signed)
Noted consult by palliative care. Agree with transitioning to comfort care/not escalating care in interim. Neurology will be available as needed.   Roland Rack, MD [facility] [phone]  If 7pm- 7am, please page neurology on call as listed in [place].

## 2019-02-12 LAB — GLUCOSE, CAPILLARY
Glucose-Capillary: 89 mg/dL (ref 70–99)
Glucose-Capillary: 45 mg/dL — ABNORMAL LOW (ref 70–99)
Glucose-Capillary: 52 mg/dL — ABNORMAL LOW (ref 70–99)
Glucose-Capillary: 83 mg/dL (ref 70–99)
Glucose-Capillary: 91 mg/dL (ref 70–99)
Glucose-Capillary: 106 mg/dL — ABNORMAL HIGH (ref 70–99)
Glucose-Capillary: 121 mg/dL — ABNORMAL HIGH (ref 70–99)
Glucose-Capillary: 128 mg/dL — ABNORMAL HIGH (ref 70–99)
Glucose-Capillary: 138 mg/dL — ABNORMAL HIGH (ref 70–99)

## 2019-02-12 MED ORDER — DEXTROSE 50 % IV SOLN
25.0000 g | INTRAVENOUS | Status: AC
  Administered 2019-02-12: 25 g via INTRAVENOUS

## 2019-02-12 MED ORDER — DEXTROSE 50 % IV SOLN
INTRAVENOUS | Status: AC
  Filled 2019-02-12: qty 50

## 2019-02-12 NOTE — Progress Notes (Signed)
NAME:  Garrett Henson, MRN:  [id number], DOB:  1925-01-03, LOS: 7 ADMISSION DATE:  02/01/2019, CONSULTATION DATE:  02/09/2019 REFERRING MD:  Dr. Cheral Marker, CHIEF COMPLAINT:  AMS  Brief History   42 yoM presenting from home after being found down in yard presenting as code stroke with altered mental status.  Rapid decline in mental status with EMS.  CTH negative, treated with tPA for possible acute CVA and required intubation for airway protections.  Unable to have CTA 2/2 idonine allergy.  MRI brain 9/23 and subsequent CT scans are all consistent with extensive bilateral global ischemic encephalopathy.  He has had no improvement in his neurological exams off sedation and prognosis for meaningful recovery is poor. Family decided to 9/28 for full DNR with plans to transition to full on 10/2 or sooner pending family arrival.    Past Medical History   Past Medical History:  Diagnosis Date  . Arthritis   . Back pain   . Coronary artery disease    takes Plavix daily  . Diabetes mellitus    takes Metformin daily  . GERD (gastroesophageal reflux disease)    takes tagamet daily  . Headache    HX MIGRAINES  . History of blood transfusion    no abnormal reaction noted  . History of colon polyps    benign  . History of migraine    many yrs ago  . Hyperlipidemia    takes Crestor daily  . Hypertension    takes Amlodipine daily  . Joint swelling   . Lung mass 12/01/2014  . Lung mass 12/01/2014  . Macular degeneration    wet and on the left;injection 01/20/15  . Nocturia   . Overactive bladder    takes Myrbetriq daily  . Prostate cancer ([place])   . Stroke ([place])   . TIA (transient ischemic attack)   . Urinary frequency   . Urinary urgency   . Vertigo    takes Antivert daily as needed    Significant Hospital Events   9/23 Admit 9/28 full DNR  Consults:  Neurology PMT  Procedures:  9/23 ETT >>  Significant Diagnostic Tests:  9/23 CTH > 1. No acute  intracranial abnormality. 2. Paranasal sinus disease particularly in the sphenoid sinus. 3. Question encephalomalacia in the right parietal-occipital region which could be related to old infarct. CT maxillofacial 9/23 > 1. No acute facial bone fracture. 2. Gas and fluid in the sphenoid sinus may be related to recent intubation. Mild paranasal sinus disease. CT cervical 9/23 > 1. No acute bone abnormality in cervical spine. 2. Prominent scarring at the right lung apex is only partially imaged. 9/23 Pelvis XR > neg 9/23 MRI brain / MRA >  1. Extensive ischemic changes involving the bilateral cerebral and cerebellar hemispheres, as well as involvement of the bilateral thalami and right lateral medulla. Constellation of findings felt to be most consistent with hypoperfusion injury/cerebral anoxic event.  No associated hemorrhage or significant mass effect. 2. Underlying age-related cerebral atrophy with mild chronic small vessel ischemic disease, with additional chronic ischemic changes as above.Negative intracranial MRA with wide patency of the large and medium sized vessels of the intracranial circulation. No hemodynamically significant or correctable stenosis. 3. Negative intracranial MRA with wide patency of the large and medium sized vessels of the intracranial circulation. No hemodynamically significant or correctable stenosis.  9/24 TTE >> EF 60-65% 9/27 CTH >> 1. Extensive ischemic change again noted bilaterally involving the cerebral and cerebellar hemispheres and basal ganglia. Appearance  and distribution is largely stable except in the left thalamus where the edema pattern shows slight progression. 2. No evidence for new interval hemorrhage or extra-axial fluid collection. 3. Fluid again noted in the sphenoid sinuses and right mastoid air cells.  Micro Data:  9/23 SARS Cov2 > neg 9/24 MRSA PCR >> neg  Antimicrobials:  None.  Interim history/subjective:  Increasing temp and  increased ETT secretions.  Continues to breath over MV, but no neurological improvements.   Objective   Blood pressure (!) 145/50, pulse 80, temperature (!) 100.8 F (38.2 C), resp. rate 13, height 5\' 10"  (1.778 m), weight 83 kg, SpO2 98 %.    Vent Mode: PRVC FiO2 (%):  [30 %] 30 % Set Rate:  [16 bmp] 16 bmp Vt Set:  [480 mL] 480 mL PEEP:  [5 cmH20] 5 cmH20 Pressure Support:  [10 cmH20] 10 cmH20 Plateau Pressure:  [11 cmH20-13 cmH20] 11 cmH20   Intake/Output Summary (Last 24 hours) at 02/12/2019 0819 Last data filed at 02/12/2019 0700 Gross per 24 hour  Intake 2968.29 ml  Output 1200 ml  Net 1768.29 ml   Filed Weights   02/10/19 0400 02/11/19 0500 02/12/19 0500  Weight: 82.9 kg 83 kg 83 kg   Examination: General:  Elderly male on MV in NAD HEENT: MM pink/moist, pupils 3/reactive, ETT/ OGT Neuro: Unresponsive to noxious stimuli CV: rr, no murmur PULM:  Breathing over set rate, diffuse rhonchi with moderate thick tan secretions GI: soft, bs active, foley Extremities: warm/dry, generalized upper edema  Skin: bruising to b/l arms  Resolved Hospital Problem list    Assessment & Plan:   Acute respiratory insufficiency Extensive bilateral hypoxic brain injury  Acute kidney injury DMT2  Normocytic Anemia with suspected gastritis 9/25 Hypoglycemic event 9/30 despite TF at goal  - no improvement or changes in neuro exam since admit on 9/23 and has not required any sedation.  MRI/ CTH confirms diffuse anoxic injury.  Prognosis for any meaningful recovery is poor.  Palliative morphine started 9/25 for comfort. P:  Appreciate palliative care assistance Remains DNR  Continue scheduled morphine for comfort and PRN Tylenol for fever/ comfort Continue with current level of care, no escalation.  Plan for transition to full comfort care/ one way extubation on 10/2 or sooner when remaining family arrives D/c lantus, continue SSI  Best practice:  Diet: NPO/ TF Pain/Anxiety/Delirium  protocol (if indicated): morphine q 6 and prn  VAP protocol (if indicated): yes DVT prophylaxis: lovenox/ SCDs GI prophylaxis: PPI Glucose control: SSI Mobility: BR Code Status: full DNR Family Communication: Wife, Vickii Chafe [phone]) updated at bedside 9/30, ongoing support provided.   Disposition: ICU   Labs   CBC: Recent Labs  Lab 02/06/19 0915 02/07/19 0556 02/09/19 0720 02/09/19 1303 02/10/19 0502  WBC 14.0* 23.6* 10.7* 9.2 11.9*  HGB 13.0 11.2* 8.6* 7.6* 8.1*  HCT 39.4 33.9* 26.5* 24.0* 25.3*  MCV 82.1 82.9 83.6 84.5 83.5  PLT 233 203 159 160 009    Basic Metabolic Panel: Recent Labs  Lab 02/02/2019 1747 01/15/2019 1813  01/14/2019 2213 02/06/19 0105 02/06/19 0429 02/06/19 1427 02/07/19 0556 02/07/19 1629 02/09/19 0252  NA 136 136   < >  --  134* 134*  --  135 137 141  K 4.0 3.9   < >  --  4.1 4.2  --  4.7 4.5 4.4  CL 103 104  --   --  103  --   --  106 113* 115*  CO2 20*  --   --   --  17*  --   --  17* 14* 18*  GLUCOSE 188* 191*  --   --  233*  --   --  243* 257* 200*  BUN 22 24*  --   --  25*  --   --  47* 60* 73*  CREATININE 1.15 1.00  --   --  1.19  --   --  2.23* 2.22* 1.72*  CALCIUM 8.8*  --   --   --  8.6*  --   --  7.9* 7.7* 7.6*  MG  --   --   --  2.0  --   --  2.1 2.0 2.1 2.1  PHOS  --   --   --  2.9  --   --  3.9 3.4 2.4*  --    < > = values in this interval not displayed.   GFR: Estimated Creatinine Clearance: 28.9 mL/min (A) (by C-G formula based on SCr of 1.72 mg/dL (H)). Recent Labs  Lab 01/29/2019 1747 01/24/2019 2213  02/07/19 0556 02/09/19 0720 02/09/19 1303 02/10/19 0502  WBC 8.3  --    < > 23.6* 10.7* 9.2 11.9*  LATICACIDVEN 2.0* 2.1*  --   --   --   --   --    < > = values in this interval not displayed.    Liver Function Tests: Recent Labs  Lab 02/10/2019 1747  AST 24  ALT 11  ALKPHOS 73  BILITOT 0.3  PROT 6.7  ALBUMIN 3.6   No results for input(s): LIPASE, AMYLASE in the last 168 hours. No results for input(s): AMMONIA in  the last 168 hours.  ABG    Component Value Date/Time   PHART 7.316 (L) 02/06/2019 0429   PCO2ART 33.5 02/06/2019 0429   PO2ART 156.0 (H) 02/06/2019 0429   HCO3 17.2 (L) 02/06/2019 0429   TCO2 18 (L) 02/06/2019 0429   ACIDBASEDEF 8.0 (H) 02/06/2019 0429   O2SAT 99.0 02/06/2019 0429     Coagulation Profile: Recent Labs  Lab 02/12/2019 1747  INR 1.0    Cardiac Enzymes: Recent Labs  Lab 02/07/19 1037  CKTOTAL 287    HbA1C: Hgb A1c MFr Bld  Date/Time Value Ref Range Status  02/06/2019 01:05 AM 7.1 (H) 4.8 - 5.6 % Final    Comment:    (NOTE) Pre diabetes:          5.7%-6.4% Diabetes:              >6.4% Glycemic control for   <7.0% adults with diabetes     CBG: Recent Labs  Lab 02/11/19 1632 02/11/19 2000 02/11/19 2323 02/12/19 0325 02/12/19 0805  GLUCAP 105* 121* 120* 89 45*    I spent 20 minutes in direct patient care including reviewing data,  discussing with other providers, assessment, planning and stabilization and documentation. Time is exclusive to this patient and does not include procedures.    Kennieth Rad, MSN, AGACNP-BC [facility] Pgr: [phone] or if no answer [phone] 02/12/2019, 8:33 AM     Kennieth Rad, MSN, AGACNP-BC [facility] Pgr: [phone] or if no answer [phone] 02/12/2019, 8:19 AM

## 2019-02-13 LAB — GLUCOSE, CAPILLARY
Glucose-Capillary: 146 mg/dL — ABNORMAL HIGH (ref 70–99)
Glucose-Capillary: 147 mg/dL — ABNORMAL HIGH (ref 70–99)

## 2019-02-13 NOTE — Progress Notes (Signed)
Nutrition Brief Note  Chart reviewed. Per MD overall prognosis is very poor. Per MD plan for withdrawal of care on 10/2. Plan to continue TF until time to withdraw.  Available as needed.    [place], [place], [place] Pager [phone] After Hours Pager

## 2019-02-13 NOTE — Progress Notes (Signed)
Chaplain visited with the wife of the patient, as the process for withdrawing care is happening soon.  The wife expressed sadness at the loss of her husband, but also expressed some joy regarding their family.  The patient displayed a strong use of spirituality for coping with the impending trauma.  The chaplain will be present for the family as they plan to withdraw care.  Brion Aliment Chaplain Resident For questions concerning this note please contact me by pager [phone]

## 2019-02-13 NOTE — Progress Notes (Signed)
NAME:  Jeri Rawlins, MRN:  [id number], DOB:  09/05/1924, LOS: 8 ADMISSION DATE:  01/21/2019, CONSULTATION DATE:  01/17/2019 REFERRING MD:  Dr. Cheral Marker, CHIEF COMPLAINT:  AMS  Brief History   38 yoM presenting from home after being found down in yard presenting as code stroke with altered mental status.  Rapid decline in mental status with EMS.  LSW 1645.  Required intubation on arrival to ER for airway protection with GCS 3 and right gaze.  CTH neg.  tPA given.  CTA deferred due to iodine allergy.  Taken for stat MRI.  Neurology consulting, PCCM asked to admit.    Past Medical History   Past Medical History:  Diagnosis Date  . Arthritis   . Back pain   . Coronary artery disease    takes Plavix daily  . Diabetes mellitus    takes Metformin daily  . GERD (gastroesophageal reflux disease)    takes tagamet daily  . Headache    HX MIGRAINES  . History of blood transfusion    no abnormal reaction noted  . History of colon polyps    benign  . History of migraine    many yrs ago  . Hyperlipidemia    takes Crestor daily  . Hypertension    takes Amlodipine daily  . Joint swelling   . Lung mass 12/01/2014  . Lung mass 12/01/2014  . Macular degeneration    wet and on the left;injection 01/20/15  . Nocturia   . Overactive bladder    takes Myrbetriq daily  . Prostate cancer ([place])   . Stroke ([place])   . TIA (transient ischemic attack)   . Urinary frequency   . Urinary urgency   . Vertigo    takes Antivert daily as needed    Significant Hospital Events   9/23 Admit  Consults:  Neurology PMT  Procedures:  9/23 ETT >>  Significant Diagnostic Tests:  9/23 CTH > 1. No acute intracranial abnormality. 2. Paranasal sinus disease particularly in the sphenoid sinus. 3. Question encephalomalacia in the right parietal-occipital region which could be related to old infarct. CT maxillofacial 9/23 > 1. No acute facial bone fracture. 2. Gas and fluid  in the sphenoid sinus may be related to recent intubation. Mild paranasal sinus disease. CT cervical 9/23 > 1. No acute bone abnormality in cervical spine. 2. Prominent scarring at the right lung apex is only partially imaged. 9/23 Pelvis XR > neg 9/23 MRI brain / MRA >  1. Extensive ischemic changes involving the bilateral cerebral and cerebellar hemispheres, as well as involvement of the bilateral thalami and right lateral medulla. Constellation of findings felt to be most consistent with hypoperfusion injury/cerebral anoxic event.  No associated hemorrhage or significant mass effect. 2. Underlying age-related cerebral atrophy with mild chronic small vessel ischemic disease, with additional chronic ischemic changes as above.Negative intracranial MRA with wide patency of the large and medium sized vessels of the intracranial circulation. No hemodynamically significant or correctable stenosis. 3. Negative intracranial MRA with wide patency of the large and medium sized vessels of the intracranial circulation. No hemodynamically significant or correctable stenosis.  9/24 TTE >> EF 60-65% 9/27 CTH >> 1. Extensive ischemic change again noted bilaterally involving the cerebral and cerebellar hemispheres and basal ganglia. Appearance and distribution is largely stable except in the left thalamus where the edema pattern shows slight progression. 2. No evidence for new interval hemorrhage or extra-axial fluid collection. 3. Fluid again noted in the sphenoid sinuses and right mastoid  air cells.  Micro Data:  9/23 SARS Cov2 > neg 9/24 MRSA PCR >> neg  Antimicrobials:  None.  Interim history/subjective:  Has been receiving low-dose morphine intermittently for comfort RN reports that he opened his left eye, may have blinked for family at bedside 9/29  Objective   Blood pressure (!) 162/53, pulse 87, temperature 100.2 F (37.9 C), resp. rate 19, height 5\' 10"  (1.778 m), weight 83 kg, SpO2 100 %.     Vent Mode: PRVC FiO2 (%):  [30 %] 30 % Set Rate:  [16 bmp] 16 bmp Vt Set:  [480 mL] 480 mL PEEP:  [5 cmH20] 5 cmH20 Plateau Pressure:  [12 cmH20-15 cmH20] 13 cmH20   Intake/Output Summary (Last 24 hours) at 02/13/2019 0908 Last data filed at 02/13/2019 0900 Gross per 24 hour  Intake 2228.18 ml  Output 2200 ml  Net 28.18 ml   Filed Weights   02/10/19 0400 02/11/19 0500 02/12/19 0500  Weight: 82.9 kg 83 kg 83 kg   Examination: General: Elderly ill-appearing man, ventilated HEENT: Oropharynx dry, pupils, 2 mm, sluggish.  ET tube in place Neuro: Does not open eyes or respond in any way to voice on my exam, may have some attempted blinking with pain.  Extensor posturing lower extremities to pain.  Toes downgoing Babinski. CV: Regular, no murmur PULM: Coarse bilaterally, no wheezes or crackles GI: Soft, nondistended with positive bowel sounds Extremities: 1+ generalized  edema Skin: Healing abrasion forehead, bruises on arms, no rash  Resolved Hospital Problem list    Assessment & Plan:   Acute respiratory insufficiency Extensive bilateral hypoxic brain injury  Acute kidney injury DMT2  Normocytic Anemia with suspected gastritis 9/25 -I have not seen any change on neurological exam, may have been seen to open left eye to stimulation, may have blink but unclear on command for family.  Overall his prognosis for any kind meaningful recovery is very poor P:  -Appreciate palliative care assistance.  Plan has been for withdrawal of care and compassionate extubation on 10/2.  Need to confirm with family as they arrived into town. -Continue full ventilator support until planned withdrawal -Scheduled morphine added 9/28 for presumed discomfort -Continue tube feeding until time to withdraw care.  Stop CBG and SSI now   Best practice:  Diet: NPO/ TF Pain/Anxiety/Delirium protocol (if indicated): morphine q 6 and prn  VAP protocol (if indicated): yes DVT prophylaxis: lovenox/ SCDs  GI prophylaxis: PPI Glucose control: SSI + Lantus  Mobility: BR Code Status: full DNR Family Communication: Wife, Vickii Chafe [phone]) updated at bedside 9/28. Appreciate Palliative Care discussions w family  Disposition: ICU   Labs   CBC: Recent Labs  Lab 02/06/19 0915 02/07/19 0556 02/09/19 0720 02/09/19 1303 02/10/19 0502  WBC 14.0* 23.6* 10.7* 9.2 11.9*  HGB 13.0 11.2* 8.6* 7.6* 8.1*  HCT 39.4 33.9* 26.5* 24.0* 25.3*  MCV 82.1 82.9 83.6 84.5 83.5  PLT 233 203 159 160 841    Basic Metabolic Panel: Recent Labs  Lab 02/06/19 1427 02/07/19 0556 02/07/19 1629 02/09/19 0252  NA  --  135 137 141  K  --  4.7 4.5 4.4  CL  --  106 113* 115*  CO2  --  17* 14* 18*  GLUCOSE  --  243* 257* 200*  BUN  --  47* 60* 73*  CREATININE  --  2.23* 2.22* 1.72*  CALCIUM  --  7.9* 7.7* 7.6*  MG 2.1 2.0 2.1 2.1  PHOS 3.9 3.4 2.4*  --  GFR: Estimated Creatinine Clearance: 28.9 mL/min (A) (by C-G formula based on SCr of 1.72 mg/dL (H)). Recent Labs  Lab 02/07/19 0556 02/09/19 0720 02/09/19 1303 02/10/19 0502  WBC 23.6* 10.7* 9.2 11.9*    Liver Function Tests: No results for input(s): AST, ALT, ALKPHOS, BILITOT, PROT, ALBUMIN in the last 168 hours. No results for input(s): LIPASE, AMYLASE in the last 168 hours. No results for input(s): AMMONIA in the last 168 hours.  ABG    Component Value Date/Time   PHART 7.316 (L) 02/06/2019 0429   PCO2ART 33.5 02/06/2019 0429   PO2ART 156.0 (H) 02/06/2019 0429   HCO3 17.2 (L) 02/06/2019 0429   TCO2 18 (L) 02/06/2019 0429   ACIDBASEDEF 8.0 (H) 02/06/2019 0429   O2SAT 99.0 02/06/2019 0429     Coagulation Profile: No results for input(s): INR, PROTIME in the last 168 hours.  Cardiac Enzymes: Recent Labs  Lab 02/07/19 1037  CKTOTAL 287    HbA1C: Hgb A1c MFr Bld  Date/Time Value Ref Range Status  02/06/2019 01:05 AM 7.1 (H) 4.8 - 5.6 % Final    Comment:    (NOTE) Pre diabetes:          5.7%-6.4% Diabetes:               >6.4% Glycemic control for   <7.0% adults with diabetes     CBG: Recent Labs  Lab 02/12/19 1515 02/12/19 1950 02/12/19 2319 02/13/19 0325 02/13/19 0801  GLUCAP 121* 128* 138* [place]    Baltazar Apo, MD, PhD 02/13/2019, 9:15 AM [facility] [phone] or if no answer [phone]

## 2019-02-13 DEATH — deceased

## 2019-02-14 DIAGNOSIS — Z515 Encounter for palliative care: Secondary | ICD-10-CM

## 2019-02-14 MED ORDER — MORPHINE BOLUS VIA INFUSION
2.0000 mg | INTRAVENOUS | Status: DC | PRN
  Administered 2019-02-14: 2 mg via INTRAVENOUS
  Filled 2019-02-14: qty 2

## 2019-02-14 MED ORDER — GLYCOPYRROLATE 0.2 MG/ML IJ SOLN
0.1000 mg | INTRAMUSCULAR | Status: DC
  Administered 2019-02-14: 10:00:00 0.1 mg via INTRAVENOUS
  Filled 2019-02-14: qty 1

## 2019-02-14 MED ORDER — GLYCOPYRROLATE 0.2 MG/ML IJ SOLN
0.2000 mg | INTRAMUSCULAR | Status: DC | PRN
  Administered 2019-02-14: 11:00:00 0.2 mg via INTRAVENOUS

## 2019-02-14 MED ORDER — LORAZEPAM 2 MG/ML IJ SOLN
INTRAMUSCULAR | Status: AC
  Filled 2019-02-14: qty 1

## 2019-02-14 MED ORDER — LORAZEPAM 2 MG/ML IJ SOLN
2.0000 mg | INTRAMUSCULAR | Status: DC | PRN
  Administered 2019-02-14: 11:00:00 2 mg via INTRAVENOUS

## 2019-02-14 MED ORDER — MORPHINE 100MG IN NS 100ML (1MG/ML) PREMIX INFUSION
1.0000 mg/h | INTRAVENOUS | Status: DC
  Administered 2019-02-14: 11:00:00 5 mg/h via INTRAVENOUS
  Filled 2019-02-14: qty 100

## 2019-02-14 MED ORDER — SCOPOLAMINE 1 MG/3DAYS TD PT72
1.0000 | MEDICATED_PATCH | TRANSDERMAL | Status: DC
  Administered 2019-02-14: 10:00:00 1.5 mg via TRANSDERMAL
  Filled 2019-02-14: qty 1

## 2019-02-17 ENCOUNTER — Telehealth: Payer: Self-pay

## 2019-02-17 NOTE — Telephone Encounter (Signed)
Received dc from [facility].  DC is for cremation and a a patient of Doctor Mannam.   DC will be taken to [place] for signature.  On 02/18/2019 Received signed dc back from Doctor Mannam.  I called the funeral home to let them know the dc is ready for pickup and also faxed a copy to the funeral home per their request.

## 2019-02-19 ENCOUNTER — Encounter (INDEPENDENT_AMBULATORY_CARE_PROVIDER_SITE_OTHER): Payer: Medicare Other | Admitting: Ophthalmology

## 2019-03-16 NOTE — Progress Notes (Signed)
NAME:  Garrett Henson, MRN:  [id number], DOB:  02/25/1925, LOS: 9 ADMISSION DATE:  02/01/2019, CONSULTATION DATE:  02/07/2019 REFERRING MD:  Dr. Cheral Marker, CHIEF COMPLAINT:  AMS  Brief History   38 yoM presenting from home after being found down in yard presenting as code stroke with altered mental status.  Rapid decline in mental status with EMS.  LSW 1645.  Required intubation on arrival to ER for airway protection with GCS 3 and right gaze.  CTH neg.  tPA given.  CTA deferred due to iodine allergy.  Taken for stat MRI.  Neurology consulting, PCCM asked to admit.    Past Medical History   Past Medical History:  Diagnosis Date  . Arthritis   . Back pain   . Coronary artery disease    takes Plavix daily  . Diabetes mellitus    takes Metformin daily  . GERD (gastroesophageal reflux disease)    takes tagamet daily  . Headache    HX MIGRAINES  . History of blood transfusion    no abnormal reaction noted  . History of colon polyps    benign  . History of migraine    many yrs ago  . Hyperlipidemia    takes Crestor daily  . Hypertension    takes Amlodipine daily  . Joint swelling   . Lung mass 12/01/2014  . Lung mass 12/01/2014  . Macular degeneration    wet and on the left;injection 01/20/15  . Nocturia   . Overactive bladder    takes Myrbetriq daily  . Prostate cancer ([place])   . Stroke ([place])   . TIA (transient ischemic attack)   . Urinary frequency   . Urinary urgency   . Vertigo    takes Antivert daily as needed    Significant Hospital Events   9/23 Admit  Consults:  Neurology PMT  Procedures:  9/23 ETT >>  Significant Diagnostic Tests:  9/23 CTH > 1. No acute intracranial abnormality. 2. Paranasal sinus disease particularly in the sphenoid sinus. 3. Question encephalomalacia in the right parietal-occipital region which could be related to old infarct. CT maxillofacial 9/23 > 1. No acute facial bone fracture. 2. Gas and fluid  in the sphenoid sinus may be related to recent intubation. Mild paranasal sinus disease. CT cervical 9/23 > 1. No acute bone abnormality in cervical spine. 2. Prominent scarring at the right lung apex is only partially imaged. 9/23 Pelvis XR > neg 9/23 MRI brain / MRA >  1. Extensive ischemic changes involving the bilateral cerebral and cerebellar hemispheres, as well as involvement of the bilateral thalami and right lateral medulla. Constellation of findings felt to be most consistent with hypoperfusion injury/cerebral anoxic event.  No associated hemorrhage or significant mass effect. 2. Underlying age-related cerebral atrophy with mild chronic small vessel ischemic disease, with additional chronic ischemic changes as above.Negative intracranial MRA with wide patency of the large and medium sized vessels of the intracranial circulation. No hemodynamically significant or correctable stenosis. 3. Negative intracranial MRA with wide patency of the large and medium sized vessels of the intracranial circulation. No hemodynamically significant or correctable stenosis.  9/24 TTE >> EF 60-65% 9/27 CTH >> 1. Extensive ischemic change again noted bilaterally involving the cerebral and cerebellar hemispheres and basal ganglia. Appearance and distribution is largely stable except in the left thalamus where the edema pattern shows slight progression. 2. No evidence for new interval hemorrhage or extra-axial fluid collection. 3. Fluid again noted in the sphenoid sinuses and right mastoid  air cells.  Micro Data:  9/23 SARS Cov2 > neg 9/24 MRSA PCR >> neg  Antimicrobials:  None.  Interim history/subjective:  Has been receiving low-dose morphine intermittently for comfort T max 100 Family to arrive today for terminal extubation and transition to full confort care  Pt. Is a DNR Will add Morphine gtt and Robinul/ scopolamine patch  For secretions  Objective   Blood pressure (!) 142/58, pulse 86,  temperature 99.9 F (37.7 C), resp. rate 19, height 5\' 10"  (1.778 m), weight 83 kg, SpO2 100 %.    Vent Mode: PRVC FiO2 (%):  [30 %] 30 % Set Rate:  [16 bmp] 16 bmp Vt Set:  [480 mL] 480 mL PEEP:  [5 cmH20] 5 cmH20 Plateau Pressure:  [11 cmH20-12 cmH20] 11 cmH20   Intake/Output Summary (Last 24 hours) at 03/06/19 0952 Last data filed at 06-Mar-2019 0600 Gross per 24 hour  Intake 3850.92 ml  Output 2000 ml  Net 1850.92 ml   Filed Weights   02/10/19 0400 02/11/19 0500 02/12/19 0500  Weight: 82.9 kg 83 kg 83 kg   Examination: General: Elderly ill-appearing man, ventilated, unresponsive HEENT: [place],  Abrasion to forehead, MM dry,  pupils, 2 mm, sluggish.  ET tube secure in place Neuro: Does not open eyes or respond in any way to voice or painful stimuli, Extensor posturing lower extremities to pain.  Toes downgoing Babinski. CV: S1, S2, RRR,, no murmur, rub or gallop PULM: Bilateral chest excursion, Coarse bilaterally, no wheezes or crackles GI: Soft, nondistended with positive bowel sounds, TF infusing Extremities: 1+ generalized  Edema, no obvious deformities Skin: Healing abrasion forehead, bruises on arms, no rash  Resolved Hospital Problem list    Assessment & Plan:   Acute respiratory insufficiency Extensive bilateral hypoxic brain injury  Acute kidney injury DMT2  Normocytic Anemia with suspected gastritis 9/25 - No change in neuro exam - Overall his prognosis for any kind meaningful recovery is very poor P:  -Appreciate palliative care assistance.   - Plan has been for withdrawal of care and compassionate extubation on 10/2.   - Continue full ventilator support until planned withdrawal - Will order morphine gtt and Robinul/ scopolamine patch for secretions  - Tube feeds off for extubation.   - No CBG and SSI    Best practice:  Diet: NPO/ TF Pain/Anxiety/Delirium protocol (if indicated): Morphine gtt once family arrives  VAP protocol (if indicated): yes DVT  prophylaxis: lovenox/ SCDs GI prophylaxis: PPI Glucose control: SSI + Lantus  Mobility: BR Code Status: full DNR Family Communication: Wife, Vickii Chafe [phone]) updated at bedside 9/28. Appreciate Palliative Care discussions w family  Disposition: ICU   Labs   CBC: Recent Labs  Lab 02/09/19 0720 02/09/19 1303 02/10/19 0502  WBC 10.7* 9.2 11.9*  HGB 8.6* 7.6* 8.1*  HCT 26.5* 24.0* 25.3*  MCV 83.6 84.5 83.5  PLT 159 160 160    Basic Metabolic Panel: Recent Labs  Lab 02/07/19 1629 02/09/19 0252  NA 137 141  K 4.5 4.4  CL 113* 115*  CO2 14* 18*  GLUCOSE 257* 200*  BUN 60* 73*  CREATININE 2.22* 1.72*  CALCIUM 7.7* 7.6*  MG 2.1 2.1  PHOS 2.4*  --    GFR: Estimated Creatinine Clearance: 28.9 mL/min (A) (by C-G formula based on SCr of 1.72 mg/dL (H)). Recent Labs  Lab 02/09/19 0720 02/09/19 1303 02/10/19 0502  WBC 10.7* 9.2 11.9*    Liver Function Tests: No results for input(s): AST, ALT, ALKPHOS, BILITOT, PROT,  ALBUMIN in the last 168 hours. No results for input(s): LIPASE, AMYLASE in the last 168 hours. No results for input(s): AMMONIA in the last 168 hours.  ABG    Component Value Date/Time   PHART 7.316 (L) 02/06/2019 0429   PCO2ART 33.5 02/06/2019 0429   PO2ART 156.0 (H) 02/06/2019 0429   HCO3 17.2 (L) 02/06/2019 0429   TCO2 18 (L) 02/06/2019 0429   ACIDBASEDEF 8.0 (H) 02/06/2019 0429   O2SAT 99.0 02/06/2019 0429     Coagulation Profile: No results for input(s): INR, PROTIME in the last 168 hours.  Cardiac Enzymes: Recent Labs  Lab 02/07/19 1037  CKTOTAL 287    HbA1C: Hgb A1c MFr Bld  Date/Time Value Ref Range Status  02/06/2019 01:05 AM 7.1 (H) 4.8 - 5.6 % Final    Comment:    (NOTE) Pre diabetes:          5.7%-6.4% Diabetes:              >6.4% Glycemic control for   <7.0% adults with diabetes     CBG: Recent Labs  Lab 02/12/19 1515 02/12/19 1950 02/12/19 2319 02/13/19 0325 02/13/19 0801  GLUCAP 121* 128* 138* 146* 147*     CC APP time 20 minutes  Magdalen Spatz, MSN, AGACNP-BC [place] Pager # [phone] After 4 pm please call [phone] 03/13/19 9:52 AM

## 2019-03-16 NOTE — Procedures (Signed)
Extubation Procedure Note  Patient Details:   Name: Garrett Henson DOB: [birth year] MRN: [id number]   Airway Documentation:   Patient extubated per withdrawal of life support order.  Vent end date: 03-10-19 Vent end time: 1047   Evaluation  O2 sats: stable throughout Complications: No apparent complications Patient did tolerate procedure well. Bilateral Breath Sounds: Clear, Diminished   No  [name] 10-Mar-2019, 10:47 AM

## 2019-03-16 NOTE — Discharge Summary (Signed)
Physician Death Summary  Patient ID: JAHEEM HEDGEPATH MRN: [id number] DOB/AGE: 01/17/1925 85 y.o.  Admit date: 01/30/2019 Discharge date: 2019/03/06  Admission Diagnoses: Altered mental status  Discharge Diagnoses:  Active Problems:   Stroke (cerebrum) (HCC)   Acute cerebrovascular accident (CVA) ([place])   Altered mental status   Acute respiratory failure with hypoxia (HCC)   Hypoxic brain damage ([place])   Advanced care planning/counseling discussion   Goals of care, counseling/discussion   Palliative care by specialist   Terminal care  Discharged Condition: Deceased  Hospital Course:  Briefly this is a 85 yo M presenting from home after being found down in yard presenting as code stroke with altered mental status.  Neurology consulted.  He was intubated and given TPA. MRI, CT and exam all consistent with global ischemic injury.  Palliative care consulted and we had multiple discussions with family.  His prognosis for any meaningful recovery is very poor.  Family requested that we transition to comfort care and terminal extubation.  He was extubated on 03-06-23 and passed away later that day with family at bedside  Signed: [place] 02/18/2019, 10:18 AM

## 2019-03-16 NOTE — Progress Notes (Signed)
Responded to palliative care consult for spiritual care for family. I escorted family into room. Wife, son and daughter were present for extubation. I offered ministry of presence, words of encouragement, and prayer. Chaplain is available upon request.   Palliative care resident Colonel Bald  [phone]

## 2019-03-16 NOTE — Progress Notes (Signed)
Chaplain was with family at time of death for support.  Brion Aliment Chaplain Resident For questions concerning this note please contact me by pager [phone]

## 2019-03-16 NOTE — Progress Notes (Signed)
Daily Progress Note   Patient Name: Garrett Henson       Date: Feb 22, 2019 DOB: [birth year]  Age: 85 y.o. MRN#: [id number] Attending Physician: Collene Gobble, MD Primary Care Physician: Lorene Dy, MD Admit Date: 01/30/2019  Reason for Consultation/Follow-up: Establishing goals of care and Withdrawal of life-sustaining treatment  Subjective: Family at bedside. Discussed procedure of withdrawal of life-sustaining treatment and transition to comfort care. Answered questions and gave family emotional support. I remained at bedside at patient was extubated to comfort. Able to control symptoms with opioid infusion, bolus, and benzodiazapine.   Review of Systems  Unable to perform ROS: Intubated    Length of Stay: 9  Current Medications: Scheduled Meds:    Continuous Infusions: . morphine 5 mg/hr (22-Feb-2019 1030)    PRN Meds: acetaminophen (TYLENOL) oral liquid 160 mg/5 mL, [DISCONTINUED] acetaminophen **OR** [DISCONTINUED] acetaminophen (TYLENOL) oral liquid 160 mg/5 mL **OR** acetaminophen, glycopyrrolate, LORazepam, morphine, ondansetron  Physical Exam Vitals signs and nursing note reviewed.  Constitutional:      Appearance: He is ill-appearing.  Skin:    Coloration: Skin is pale.  Neurological:     Comments: nonresponsive                  Palliative Assessment/Data: PPS: 10%     Palliative Care Assessment & Plan   Patient Profile: 85 y.o. male  with past medical history of CAD, DM, HTN, RLL NSCLC Stage I admitted on 02/06/2019 with code stroke after being found down in his yard with R fixed gaze, declining respiratory status- requring intubation. He was given TPA. CT head and MRA Head were negative. MRI showed "extensive ischemic changes involving the bilateral  cerebral and cerebellar hemispheres, as well as involvment of the bilateral thalami and R lateral medulla- findings most consistent with hypoperfusion injury/cerebral anoxic event". Mental status has not improved since admission. Palliative consulted for [place].   Assessment/Recommendations/Plan   Patient now extubated, transitioned to comfort only  Continue comfort medications   Goals of Care and Additional Recommendations:  Limitations on Scope of Treatment: Full Comfort Care  Code Status:  DNR  Prognosis:   Hours - Days  Discharge Planning:  Anticipated Hospital Death  Care plan was discussed with patient's family  Thank you for allowing the Palliative Medicine Team to assist in the care of  this patient.   Time In: 1040 Time Out: 1130 Total Time 50 mins Prolonged Time Billed yes      Greater than 50%  of this time was spent counseling and coordinating care related to the above assessment and plan.  Mariana Kaufman, AGNP-C Palliative Medicine   Please contact Palliative Medicine Team phone at [phone] for questions and concerns.

## 2019-03-16 DEATH — deceased
